# Patient Record
Sex: Male | Born: 1950 | Race: Black or African American | Hispanic: No | Marital: Single | Smoking: Former smoker
Health system: Southern US, Community
[De-identification: ages and names within clinical notes are randomized; demographics above are authoritative.]

## PROBLEM LIST (undated history)

## (undated) DIAGNOSIS — I10 Essential (primary) hypertension: Secondary | ICD-10-CM

## (undated) DIAGNOSIS — Z950 Presence of cardiac pacemaker: Secondary | ICD-10-CM

## (undated) DIAGNOSIS — K5900 Constipation, unspecified: Secondary | ICD-10-CM

## (undated) DIAGNOSIS — I519 Heart disease, unspecified: Secondary | ICD-10-CM

## (undated) DIAGNOSIS — B20 Human immunodeficiency virus [HIV] disease: Secondary | ICD-10-CM

## (undated) DIAGNOSIS — M1990 Unspecified osteoarthritis, unspecified site: Secondary | ICD-10-CM

## (undated) HISTORY — DX: Heart disease, unspecified: I51.9

---

## 1993-04-19 DIAGNOSIS — B20 Human immunodeficiency virus [HIV] disease: Secondary | ICD-10-CM

## 1993-04-19 HISTORY — DX: Human immunodeficiency virus (HIV) disease: B20

## 1997-08-10 ENCOUNTER — Emergency Department (HOSPITAL_COMMUNITY): Admission: EM | Admit: 1997-08-10 | Discharge: 1997-08-10 | Payer: Self-pay | Admitting: Emergency Medicine

## 2000-07-05 ENCOUNTER — Inpatient Hospital Stay (HOSPITAL_COMMUNITY): Admission: EM | Admit: 2000-07-05 | Discharge: 2000-07-08 | Payer: Self-pay | Admitting: Emergency Medicine

## 2000-07-05 ENCOUNTER — Encounter (INDEPENDENT_AMBULATORY_CARE_PROVIDER_SITE_OTHER): Payer: Self-pay | Admitting: Specialist

## 2000-07-06 ENCOUNTER — Encounter: Payer: Self-pay | Admitting: Urology

## 2001-04-19 DIAGNOSIS — H9190 Unspecified hearing loss, unspecified ear: Secondary | ICD-10-CM | POA: Insufficient documentation

## 2003-04-20 DIAGNOSIS — E119 Type 2 diabetes mellitus without complications: Secondary | ICD-10-CM | POA: Insufficient documentation

## 2004-07-30 DIAGNOSIS — E538 Deficiency of other specified B group vitamins: Secondary | ICD-10-CM

## 2005-01-25 DIAGNOSIS — A539 Syphilis, unspecified: Secondary | ICD-10-CM

## 2006-03-14 DIAGNOSIS — I10 Essential (primary) hypertension: Secondary | ICD-10-CM | POA: Insufficient documentation

## 2006-09-18 DIAGNOSIS — Z96649 Presence of unspecified artificial hip joint: Secondary | ICD-10-CM

## 2006-11-28 DIAGNOSIS — D510 Vitamin B12 deficiency anemia due to intrinsic factor deficiency: Secondary | ICD-10-CM

## 2007-01-31 ENCOUNTER — Emergency Department (HOSPITAL_COMMUNITY): Admission: EM | Admit: 2007-01-31 | Discharge: 2007-01-31 | Payer: Self-pay | Admitting: Emergency Medicine

## 2007-04-03 ENCOUNTER — Encounter (INDEPENDENT_AMBULATORY_CARE_PROVIDER_SITE_OTHER): Payer: Self-pay | Admitting: Nurse Practitioner

## 2007-04-03 LAB — CONVERTED CEMR LAB
Alkaline Phosphatase: 111 units/L
BUN: 15 mg/dL
CO2: 16 meq/L
Creatinine, Ser: 1.15 mg/dL
GFR calc non Af Amer: >59 mL/min
Glucose, Bld: 117 mg/dL
Sodium: 135 meq/L

## 2007-04-12 ENCOUNTER — Encounter (INDEPENDENT_AMBULATORY_CARE_PROVIDER_SITE_OTHER): Payer: Self-pay | Admitting: Nurse Practitioner

## 2007-04-12 DIAGNOSIS — R066 Hiccough: Secondary | ICD-10-CM

## 2007-04-12 LAB — CONVERTED CEMR LAB
ALT: 17 units/L
HCT: 39.9 %
MCHC: 33.6 g/dL
MCV: 109.2 fL
RBC: 3.65 M/uL
Total Bilirubin: 0.3 mg/dL
WBC: 7.4 10*3/uL

## 2007-08-22 ENCOUNTER — Ambulatory Visit: Payer: Self-pay | Admitting: Nurse Practitioner

## 2007-08-22 DIAGNOSIS — Z8669 Personal history of other diseases of the nervous system and sense organs: Secondary | ICD-10-CM | POA: Insufficient documentation

## 2007-08-22 LAB — CONVERTED CEMR LAB: Blood Glucose, Fingerstick: 135

## 2007-08-24 ENCOUNTER — Encounter (INDEPENDENT_AMBULATORY_CARE_PROVIDER_SITE_OTHER): Payer: Self-pay | Admitting: Nurse Practitioner

## 2007-09-01 ENCOUNTER — Encounter (INDEPENDENT_AMBULATORY_CARE_PROVIDER_SITE_OTHER): Payer: Self-pay | Admitting: Nurse Practitioner

## 2007-09-04 ENCOUNTER — Encounter (INDEPENDENT_AMBULATORY_CARE_PROVIDER_SITE_OTHER): Payer: Self-pay | Admitting: Nurse Practitioner

## 2007-09-05 ENCOUNTER — Encounter (INDEPENDENT_AMBULATORY_CARE_PROVIDER_SITE_OTHER): Payer: Self-pay | Admitting: Nurse Practitioner

## 2007-11-03 ENCOUNTER — Emergency Department (HOSPITAL_COMMUNITY): Admission: EM | Admit: 2007-11-03 | Discharge: 2007-11-03 | Payer: Self-pay | Admitting: Family Medicine

## 2008-01-13 ENCOUNTER — Emergency Department (HOSPITAL_COMMUNITY): Admission: EM | Admit: 2008-01-13 | Discharge: 2008-01-13 | Payer: Self-pay | Admitting: *Deleted

## 2008-04-19 HISTORY — PX: OTHER SURGICAL HISTORY: SHX169

## 2008-04-19 HISTORY — PX: JOINT REPLACEMENT: SHX530

## 2008-08-19 ENCOUNTER — Inpatient Hospital Stay (HOSPITAL_COMMUNITY): Admission: RE | Admit: 2008-08-19 | Discharge: 2008-08-23 | Payer: Self-pay | Admitting: Orthopedic Surgery

## 2008-11-04 ENCOUNTER — Encounter: Payer: Self-pay | Admitting: Internal Medicine

## 2008-11-04 ENCOUNTER — Ambulatory Visit: Payer: Self-pay | Admitting: Internal Medicine

## 2008-11-04 LAB — CONVERTED CEMR LAB: Blood Glucose, Fingerstick: 94

## 2008-11-08 ENCOUNTER — Encounter: Payer: Self-pay | Admitting: Internal Medicine

## 2009-01-01 ENCOUNTER — Ambulatory Visit: Payer: Self-pay | Admitting: Internal Medicine

## 2009-05-13 ENCOUNTER — Emergency Department (HOSPITAL_COMMUNITY): Admission: EM | Admit: 2009-05-13 | Discharge: 2009-05-13 | Payer: Self-pay | Admitting: Emergency Medicine

## 2009-05-31 IMAGING — CR DG PORTABLE PELVIS
1 series · 1 of 1 positions shown · non-contrast
Comparison: None

CLINICAL DATA: Avascular necrosis of the left hip - left total hip
replacement.

PORTABLE PELVIS

[view not recorded]
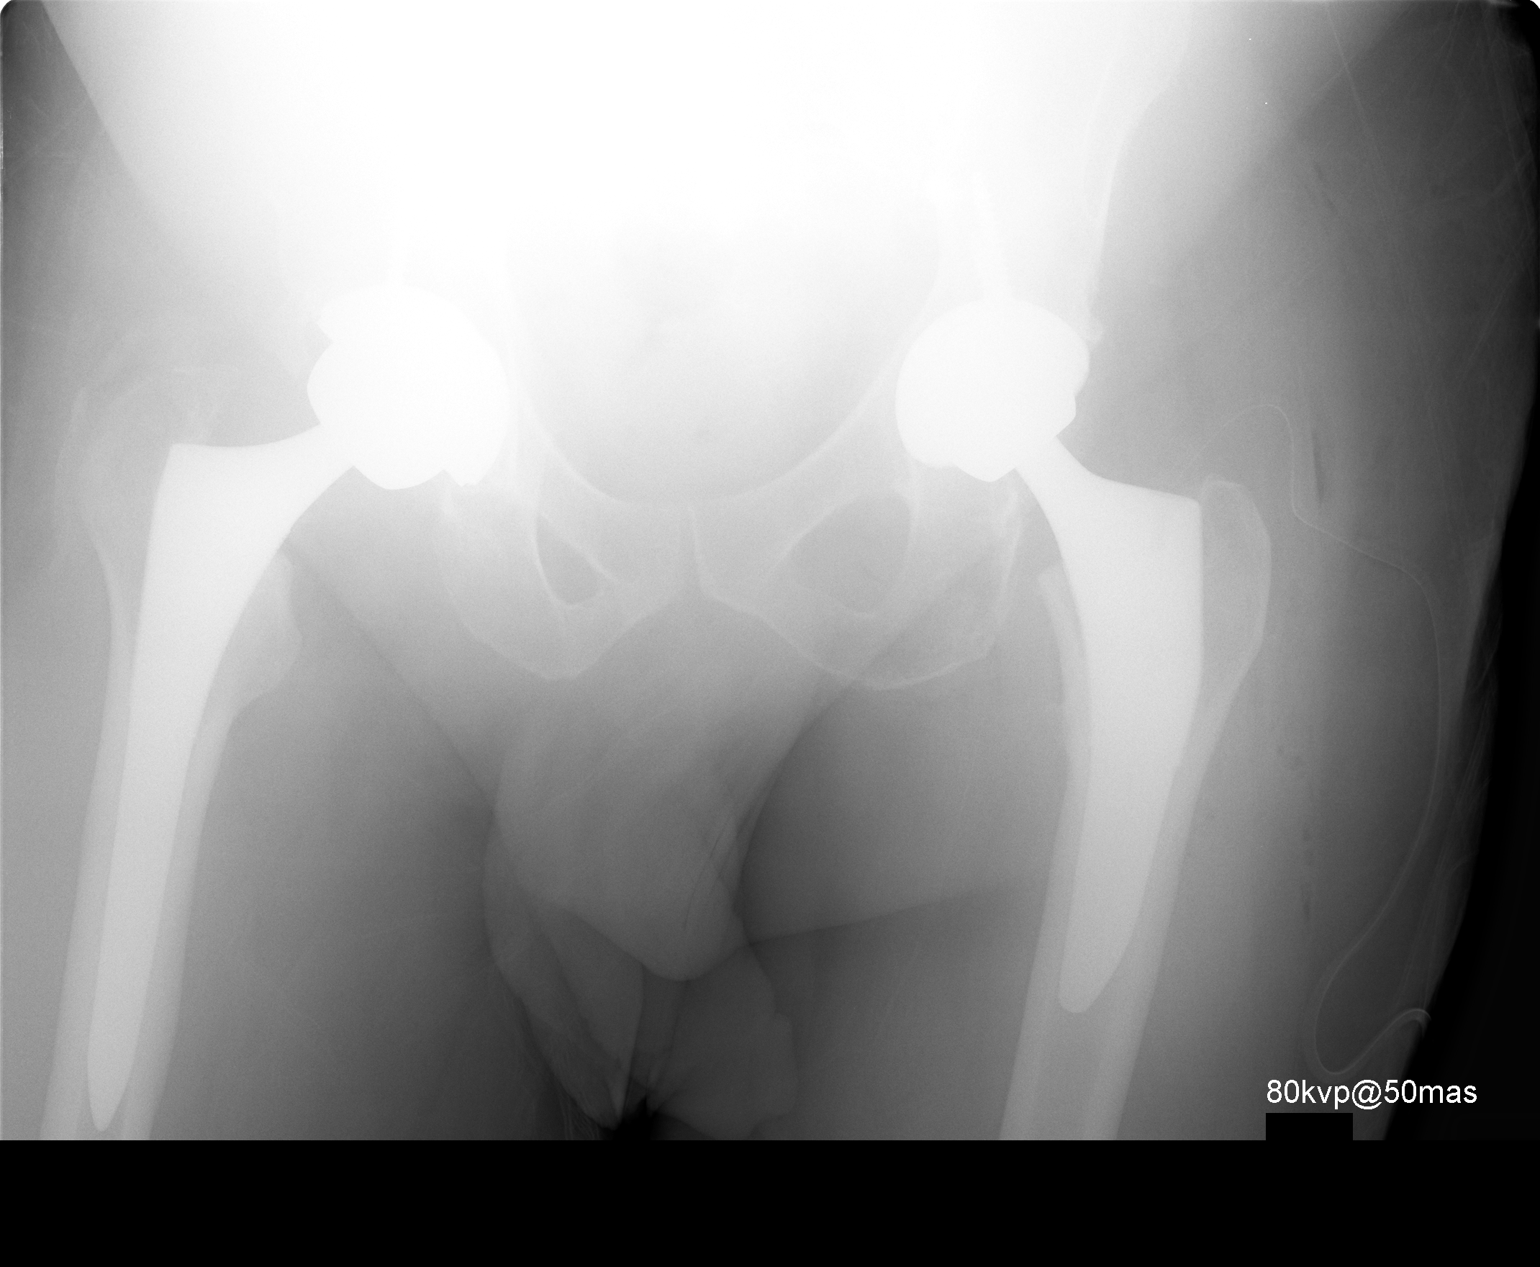

[1 of 1 positions shown; findings below may reference images not displayed]

FINDINGS: Bilateral total hip replacements are identified.
Recent postoperative changes and overlying drain on the left are
noted.
There is no evidence of subluxation or dislocation on this single
view.
No complicating features are identified.
IMPRESSION: Left total hip replacement without complicating features.

## 2010-07-28 LAB — CBC
HCT: 34.3 % — ABNORMAL LOW (ref 39.0–52.0)
Hemoglobin: 11.4 g/dL — ABNORMAL LOW (ref 13.0–17.0)
MCHC: 34.6 g/dL (ref 30.0–36.0)
MCV: 109 fL — ABNORMAL HIGH (ref 78.0–100.0)
Platelets: 174 10*3/uL (ref 150–400)
Platelets: 183 10*3/uL (ref 150–400)
RBC: 3.05 MIL/uL — ABNORMAL LOW (ref 4.22–5.81)
RDW: 13.2 % (ref 11.5–15.5)
WBC: 4.8 10*3/uL (ref 4.0–10.5)

## 2010-07-28 LAB — GLUCOSE, CAPILLARY
Glucose-Capillary: 101 mg/dL — ABNORMAL HIGH (ref 70–99)
Glucose-Capillary: 101 mg/dL — ABNORMAL HIGH (ref 70–99)
Glucose-Capillary: 102 mg/dL — ABNORMAL HIGH (ref 70–99)
Glucose-Capillary: 103 mg/dL — ABNORMAL HIGH (ref 70–99)
Glucose-Capillary: 105 mg/dL — ABNORMAL HIGH (ref 70–99)
Glucose-Capillary: 112 mg/dL — ABNORMAL HIGH (ref 70–99)
Glucose-Capillary: 113 mg/dL — ABNORMAL HIGH (ref 70–99)
Glucose-Capillary: 123 mg/dL — ABNORMAL HIGH (ref 70–99)
Glucose-Capillary: 130 mg/dL — ABNORMAL HIGH (ref 70–99)
Glucose-Capillary: 135 mg/dL — ABNORMAL HIGH (ref 70–99)
Glucose-Capillary: 83 mg/dL (ref 70–99)

## 2010-07-28 LAB — BASIC METABOLIC PANEL
BUN: 6 mg/dL (ref 6–23)
CO2: 25 mEq/L (ref 19–32)
Calcium: 8.2 mg/dL — ABNORMAL LOW (ref 8.4–10.5)
Calcium: 8.5 mg/dL (ref 8.4–10.5)
Creatinine, Ser: 0.84 mg/dL (ref 0.4–1.5)
GFR calc Af Amer: >60 mL/min (ref >60)
GFR calc non Af Amer: >60 mL/min (ref >60)
Potassium: 4.3 mEq/L (ref 3.5–5.1)
Sodium: 136 mEq/L (ref 135–145)

## 2010-07-28 LAB — TYPE AND SCREEN
ABO/RH(D): B POS
Antibody Screen: NEGATIVE

## 2010-07-29 LAB — URINALYSIS, ROUTINE W REFLEX MICROSCOPIC
Glucose, UA: NEGATIVE mg/dL
Protein, ur: NEGATIVE mg/dL

## 2010-07-29 LAB — DIFFERENTIAL
Basophils Absolute: 0.1 10*3/uL (ref 0.0–0.1)
Basophils Relative: 1 % (ref 0–1)
Eosinophils Relative: 4 % (ref 0–5)
Monocytes Absolute: 0.3 10*3/uL (ref 0.1–1.0)
Monocytes Relative: 7 % (ref 3–12)
Neutro Abs: 2.3 10*3/uL (ref 1.7–7.7)

## 2010-07-29 LAB — BASIC METABOLIC PANEL
CO2: 29 mEq/L (ref 19–32)
Calcium: 10.1 mg/dL (ref 8.4–10.5)
Chloride: 101 mEq/L (ref 96–112)
GFR calc Af Amer: >60 mL/min (ref >60)
Glucose, Bld: 137 mg/dL — ABNORMAL HIGH (ref 70–99)
Potassium: 4.7 mEq/L (ref 3.5–5.1)
Sodium: 140 mEq/L (ref 135–145)

## 2010-07-29 LAB — CBC
HCT: 44.8 % (ref 39.0–52.0)
Hemoglobin: 15.8 g/dL (ref 13.0–17.0)
MCHC: 35.2 g/dL (ref 30.0–36.0)
RBC: 4.11 MIL/uL — ABNORMAL LOW (ref 4.22–5.81)
RDW: 13.4 % (ref 11.5–15.5)

## 2010-07-29 LAB — APTT: aPTT: 25 seconds (ref 24–37)

## 2010-09-01 NOTE — Discharge Summary (Signed)
NAME:  Adam Ponce, Adam Ponce NO.:  [id number]   MEDICAL RECORD NO.:  [id number]          PATIENT TYPE:  INP   LOCATION:  1613                         FACILITY:  [facility]   PHYSICIAN:  Madlyn Frankel. Charlann Boxer, M.D.  DATE OF BIRTH:  1950/10/15   DATE OF ADMISSION:  08/19/2008  DATE OF DISCHARGE:  08/21/2008                               DISCHARGE SUMMARY   ADMITTING DIAGNOSES:  1. Avascular necrosis.  2. Osteoarthritis.  3. HIV positive.  4. Hypertension.  5. Diabetes.   DISCHARGE DIAGNOSES:  1. Avascular necrosis.  2. Osteoarthritis.  3. HIV positive.  4. Hypertension.  5. Diabetes.   HISTORY OF PRESENT ILLNESS:  A 60 year old male with a history of left  hip pain secondary to avascular necrosis.  He is HIV positive on  medications.  Left hip was refractory to all conservative treatment, was  admitted to the hospital for definitive surgical management of a left  total hip replacement.   PROCEDURE:  Left total hip replacement by surgeon Dr. Durene Romans.  Assistant Dwyane Luo PA-C.   CONSULTATION:  None.   LABORATORY DATA:  CBC final reading:  White cell blood cell 5.8,  hemoglobin 11.5, hematocrit 33.2, platelets 183.  Metabolic panel sodium  133, potassium 4, BUN 6, creatinine 0.84, glucose 111.   RADIOLOGY:  Portable pelvis postoperatively showed satisfactory  appearance of left total hip replacement with no complicating features.   Chest x-ray done on April 29 showed low lung volumes with mild left base  scar atelectasis.  No acute cardiopulmonary disease.   HOSPITAL COURSE:  Adam Ponce underwent left total hip replacement  admitted to orthopedic floor.  His stay was unremarkable.  He remained  hemodynamically orthopedically stable.  He had significant improvement  and is able to ambulate without pain.  He was weightbearing as tolerated  with no complications with ambulation.  He remained neurovascularly  intact of his left lower extremity.  His dressing was  changed on day #2  with no significant drainage from his wound or the portal site.  Based  upon his progress.  He was stable and improved with need for further  rehab due to independent status, but was stable and ready.   DISCHARGE DISPOSITION:  Discharge to skilled nursing facility rehab,  stable improved condition.   DISCHARGE INSTRUCTIONS:  1. Discharge Physical Therapy.  He is weightbearing as tolerated of      his left lower extremity with the use a rolling walker with      transition to single point cane as progress improves.  2. Discharge Diet.  Regular.  3. Discharge wound care.  Keep wound dry change dressing on a daily      basis.   DISCHARGE MEDICATIONS:  1. Lovenox 40 mg subcu q. 24 times 10 days.  2. Robaxin 500 mg one p.o. q.6 p.r.n. muscle spasm and pain.  3. Norco 7.5/325 one to two p.o. q.4-6 p.r.n. pain.  4. Iron 325 mg one p.o. t.i.d. x2 weeks.  5. Colace 100 mg one p.o. b.i.d. p.r.n. constipation.  6. MiraLax 70 grams one p.o.  daily p.r.n. constipation.  7. Start enteric-coated aspirin 325 mg one p.o. daily x4 weeks after      Lovenox completed.  8. Lisinopril 40 mg one p.o. q.a.m.  9. Potassium 20 mEq p.o. b.i.d. with food.  10.Metformin 1000 mg one p.o. b.i.d.  11.Reyataz 200 mg 2 tablets q.a.m.  12.Norvir 100 mg p.o. q.a.m., has to be refrigerated and taken with      food.  13.Truvada which is emtricitabine 200 mg with tenofovir 300 mg one      p.o. q.a.m.  14.Vitamin B12 1000 mcg injection q. monthly intramuscular.  15.Doxycycline 100 mg one p.o. q.a.m.  16.Morphine slow release 30 mg one p.o. q. 12 p.r.n.  17.Lactulose 10 grams per 15 mL one tablespoon every day as needed.  18.Konsyl which psyllium 1-2 tablespoons every day p.r.n.  19.Proventil inhaler two puffs p.r.n.  20.Omeprazole 20 mg one p.o. daily p.r.n.  21.Ativan 0.5 to 1 mg p.o. or IV q. 6-8 p.r.n. anxiety.   DISCHARGE FOLLOWUP:  Follow up with Dr. Charlann Boxer at phone number [phone] in  two  weeks for wound check.     ______________________________  Yetta Glassman. [address], [place]      Madlyn Frankel. Charlann Boxer, M.D.  Electronically Signed    BLM/MEDQ  D:  08/21/2008  T:  08/21/2008  Job:  [id number]   cc:   [facility], [facility] Dr. Haywood Pao

## 2010-09-01 NOTE — H&P (Signed)
NAME:  Adam Ponce, Adam Ponce NO.:  [id number]   MEDICAL RECORD NO.:  [id number]          PATIENT TYPE:  INP   LOCATION:  NA                           FACILITY:  [facility]   PHYSICIAN:  Madlyn Frankel. Charlann Boxer, M.D.  DATE OF BIRTH:  Oct 25, 1950   DATE OF ADMISSION:  08/19/2008  DATE OF DISCHARGE:                              HISTORY & PHYSICAL   PROCEDURE:  Left total hip replacement.   CHIEF COMPLAINT:  Left hip pain.   HISTORY OF PRESENT ILLNESS:  A 60 year old male with a history of left  hip pain secondary to avascular necrosis.  He is HIV positive.  His left  hip has been refractory to all conservative treatment.  He has had a  history of right total hip replacement in the past at the [facility].  He did develop some infection requiring a washout.  Since then, he has  been asymptomatic.  He does report that he has chronic drainage from his  right ear from prior tube placement and I believe this may have been the  source of infection.  He is on chronic antibiotics for this.   PAST MEDICAL HISTORY:  1. Avascular necrosis.  2. Osteoarthritis.  3. HIV positive.  4. Hypertension.  5. Diabetes.   PAST SURGICAL HISTORY:  Right total hip replacement at the [facility]  in [place].   FAMILY HISTORY:  Noncontributory.   SOCIAL HISTORY:  He is a smoker.  He will require rehab facility stay  postoperatively.   DRUG ALLERGIES:  NO KNOWN DRUG ALLERGIES.   PRIMARY CARE PHYSICIAN:  Dr. Haywood Pao at the [facility] in  [place].   CURRENT MEDICATIONS:  Include:  1. Reyataz which he will bring to the hospital for self-      administration.  2. Lisinopril.  3. Doxycycline.  4. Potassium.  5. Naproxen p.r.n.  6. Travatan.  7. Metformin.  8. Hydrocodone.  9. Morphine.   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  Will require occlusion at  time of surgery to prevent drainage.  GASTROINTESTINAL:  He  has  heartburn.  MUSCULOSKELETAL:  He has joint pain, muscular pain, spasms,  morning stiffness, as well as weakness.  Otherwise, see HPI.   PHYSICAL EXAMINATION:  Pulse 72.  Respirations 16.  Blood pressure  126/82.  GENERAL:  Awake, alert, and oriented.  HEENT:  Normocephalic.  No drainage from his right ear.  NECK:  Supple.  No carotid bruits.  CHEST AND LUNGS:  Clear to auscultation bilaterally.  BREASTS:  Deferred.  HEART:  S1 and S2 distinct.  ABDOMEN:  Bowel sounds present.  PELVIS:  Stable.  GENITOURINARY:  Deferred.  EXTREMITIES:  Left hip has increased pain with range of motion and  weightbearing.  SKIN:  No cellulitis of the left hip.  NEUROLOGIC:  He has intact distal sensibilities of his left lower  extremity.   LABORATORY DATA:  Labs, EKG, chest x-ray pending presurgical testing.   IMPRESSION:  1. Left hip avascular  necrosis.  2. HIV positive.   PLAN OF ACTION:  Left total hip replacement by Dr. Charlann Boxer at [facility]  on Aug 19, 2008, at 9:35 a.m.  Risks and complications were discussed.  Questions were encouraged, answered, and reviewed.  Will require rehab  stay postoperatively.     ______________________________  Yetta Glassman [address], [place]      Madlyn Frankel. Charlann Boxer, M.D.  Electronically Signed    BLM/MEDQ  D:  08/16/2008  T:  08/16/2008  Job:  [id number]   cc:   Haywood Pao, M.D.  [facility]

## 2010-09-01 NOTE — Op Note (Signed)
NAME:  Adam Ponce, Adam Ponce NO.:  [id number]   MEDICAL RECORD NO.:  [id number]          PATIENT TYPE:  INP   LOCATION:  0005                         FACILITY:  [facility] [place] [facility]   PHYSICIAN:  Madlyn Frankel. Charlann Boxer, M.D.  DATE OF BIRTH:  Aug 21, 1950   DATE OF PROCEDURE:  08/19/2008  DATE OF DISCHARGE:                               OPERATIVE REPORT   PREOPERATIVE DIAGNOSIS:  Left hip avascular necrosis.   POSTOPERATIVE DIAGNOSIS:  Left hip avascular necrosis.   PROCEDURE:  Left total hip replacement.   COMPONENTS USED:  Dupuy hip system, size 52 pinnacle cup, 36 metal liner  and an 8 high Trilot  stem with 36/1.5 ball.   SURGEON:  Madlyn Frankel. Charlann Boxer, M.D.   ASSISTANT:  Yetta Glassman. [address]oreta [address], [place]   ANESTHESIA:  General   BLOOD LOSS:  450.   DRAINS:  One.   SPECIMENS:  None.   COMPLICATIONS:  None.   INDICATIONS FOR PROCEDURE:  Mr. Fray is a 60 year old veteran with a  history of HIV.  He had had a history of right total hip replacement in  the [facility] but due to delay of care, he had sought care elsewhere.  He was seen and evaluated in the office and noted to have severe  collapse of his femoral head with probably 60 to 70% collapse with  persistent groin pain.  Workup in the office was negative for any  concern for infection despite a chronically draining ear.  He was on  doxycycline chronic.  Discussed with him the increased risks of  infection in his current situation despite the success of the right hip,  the risks of DVT, component failure, dislocation, need for revision  surgery.  There was noted to be significant shortness on his left side  from a combination of his AVN as well as his right hip surgery.  Consent  was obtained for the benefit of pain relief.   PROCEDURE IN DETAIL:  The patient was brought to the operative theater.  Once adequate anesthesia and appropriate antibiotics, Ancef  administered, the patient was administered, the patient was positioned  in the  right lateral decubitus position with his left side up.  Prior to  him being positioned, he was examined in a supine position evaluating  his leg length discrepancy on his left side and then evaluating this in  the lateral decubitus position.  The left lower extremity was then  prescrubbed, prepped and draped in a sterile fashion.  Time out was  performed identifying the patient, following procedure and extremity.  Lateral-based incision was made for posterior approach to the hip.  The  iliotibial band and gluteal fascia were then incised posteriorly.  The  short external rotators were taken down and dissected from the  posterior capsule.  An L capsulotomy was made preserving the posterior  leaflet for later anatomic repair.  The patient was noted to have very  large effusion.  It was normal synovial fluid.  There is no signs of  purulence.  There is significant fragmentation and synovitis within the  hip joint.  The  hip was dislocated and neck osteotomy was made into the  trochanteric fossa.  Severe avascular necrosis noted with severe  collapse.   Attention was first directed to the femur from the canal which was  opened with a canal drill by a box osteotome.  Then hand reamed once and  then irrigated to prevent fat emboli.  I began broaching with a size 1  broach and then broached up to a size 7 broach initially.  I used a  calcar planer to loosen the anterior bone.   Once this was done, attention was directed to the acetabulum.  Acetabular exposure was obtained.  I reamed with a 46 reamer first and  then up to 51 reamer with excellent bony bed preparation.   I set the cup orientation at about 20 degrees of forward flexion and  beneath the anterior wall anteriorly with a portion of the cuff exposed  superolaterally.   The cup with the hip guided appeared to be at about 35 to 40 degrees of  abduction and  about 20 degrees of forward flexion.  Two cancellous  screws were placed.   Trial reduction was then carried out with a 7 high  trial  broach and neck and a 36/1.5 ball with a 36 trial liner.  The hip  stability was very good  without evidence of significant impingement or  subluxation.  After the hip was flipped out, he was a bit short on this  side.   At this point, we removed all trial components.  I placed the 5/36 metal  liner.  I impacted it with good circumferential rim fit.  I then  broached up to a size 8 which sat a little bit more proud than the size  7 stem.  A final size 8 high Trilot stem was then opened and then  impacted.  There was no complication in the impaction.  I did then  retrial 36/1.5 ball and found that the leg lengths were a little bit  more comparable, perhaps still a bit shorter on his left side but  nonetheless,  it appeared hip range of motion was better without  evidence of impingement or subluxation throughout her range of motion.  Given these parameters, the final 36/1.5 ball was opened and impacted  onto a clean and dried trunion.  Hip was reduced.  The hip was irrigated  throughout the case and again at this point.  A medium Hemovac drain was  placed deep.  I reapproximated the posterior capsule to the superior  leaflet using #1 Vicryl.  The iliotibial band and gluteal fascia were  then reapproximated with #1 Vicryl over the top of the drain.  A 2-0  Vicryl was used in the subcu layer and running 4-0 Monocryl in the skin.  The skin was cleaned, dried, and dressed sterilely with  Steri-Strips  and a Mepilex dressing.  He was then brought to the recovery room in  stable condition.  He tolerated the procedure well.      Madlyn Frankel Charlann Boxer, M.D.  Electronically Signed     MDO/MEDQ  D:  08/19/2008  T:  08/19/2008  Job:  [id number]

## 2010-09-04 NOTE — Discharge Summary (Signed)
NAME:  Adam Ponce, Adam Ponce NO.:  [id number]   MEDICAL RECORD NO.:  [id number]          PATIENT TYPE:  INP   LOCATION:  1613                         FACILITY:  [facility]   PHYSICIAN:  Madlyn Frankel. Charlann Boxer, M.D.  DATE OF BIRTH:  [birth year]   DATE OF ADMISSION:  08/19/2008  DATE OF DISCHARGE:  08/23/2008                               DISCHARGE SUMMARY   ADDENDUM:   DISPOSITION:  Discharged home with home health care, stable and improved  condition.   HOSPITAL COURSE:  Patient remained in hospital on the 6th and 7th.  Patient was a [place] patient.  He was to go to [facility] since he  lives by himself.  Unfortunately, there were no [facility] beds  available.  [facility].  As a result, we tried to  coordinate nurse to help with bandage changes as well as to provide some  more physical therapy in the hospital to allow for more independence in  his ability to ambulate and get from sitting to standing.  Once again,  on the 7th he was stable and improved, had arranged for home health care  nurse to come out for wound management, this was to prevent infection  and he was stable and ready for discharge.     ______________________________  Yetta Glassman. [address], [place]      Madlyn Frankel. Charlann Boxer, M.D.  Electronically Signed    BLM/MEDQ  D:  08/29/2008  T:  08/29/2008  Job:  [id number]

## 2010-09-04 NOTE — Op Note (Signed)
[facility]  Patient:    Adam Ponce, Adam Ponce                        MRN: [id number] Proc. Date: 07/07/00 Adm. Date:  [id number] Disc. Date: [id number] Attending:  Nelma Rothman Iii                           Operative Report  PREOPERATIVE DIAGNOSES:  Right flank pain, hydronephrosis due to right distal ureteral stone composed of Indinavir.  POSTOPERATIVE DIAGNOSES:  Right flank pain, hydronephrosis due to right distal ureteral stone composed of Indinavir.  PROCEDURE:  Cystoscopy, right retrograde pyelogram with interpretation, right ureteroscopy, and stone basketing with removal of this stone and insertion of right double J catheter.  SURGEON:  Dr. Larey Dresser.  ANESTHESIA:  General.  INDICATIONS FOR PROCEDURE:  This 60 year old AIDS patient has been hospitalized with severe right flank pain with hydronephrosis seen on CT and IVP down to the distal right ureter. No calcified stone was seen on CT because he was felt to have an Indinavir stone which is non-radiopaque. Because of persistent symptoms and need for intravenous narcotics, he is brought to the OR today for removal of this stone.  DESCRIPTION OF PROCEDURE:  The patient was brought to the operating room and placed in lithotomy position. He was cystoscoped using the camera attachment. The anterior urethra was normal. The bladder was unremarkable with no stones, tumors, or inflammatory lesions. I passed a floppy metal guidewire up the distal right ureter without difficulty and then injected contrast through open ended ureteral catheter which had been placed over the guidewire and there was delicate upper ureter with some mild hydronephrosis. I then reinserted a guidewire which I used as a safety wire and removed the cystoscope. I then inserted the 6 French thin rigid ureteroscope and as soon as I got in the intramural ureter, I encountered golden, friable, "stony" like material. I used the  Elsie stone basket to retrieve several small pieces of stone in light of the other stone flushed out as I would draw back in the basket. The stone was mainly in the intramural ureter. After removing a good deal of obstructing debris, I got in the intramural ureter and there were no stones proximally. I then swept down the ureter with the stone basket and essentially all the stone was removed except for some tiny microdebris attached to the side walls. I think this will pass spontaneously. There were certainly no residual stone fragments of any size left whatsoever. I then reinjected through the ureteroscope and the upper ureter was delicate and intact and I visualized the pyelocaliceal system. I then irrigated the bladder out and sent this soft stony material for analysis. Over the guidewire, I inserted a 7 French 26 cm double J catheter whose proximal end was in the upper pyelocaliceal system. There was a full coil in the bladder and the string was brought out per urethra and later taped to the penis. The patient was then taken to the recovery room in good condition having tolerated the procedure well. DD:  07/07/00 TD:  07/08/00 Job: [id number]

## 2010-09-04 NOTE — Discharge Summary (Signed)
[facility]  Patient:    Adam Ponce, Adam Ponce                        MRN: [id number] Adm. Date:  [id number] Disc. Date: [id number] Attending:  Nelma Rothman Iii                           Discharge Summary  DIAGNOSES: 1. 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 2. Kidney stones.  OPERATIVE PROCEDURE:  Cystourethroscopy with removal of kidney stones and placement of double-J stent and right retrograde ureteral pyelogram by Dr. Larey Dresser on July 07, 2000.  HISTORY AND PHYSICAL:  Mr. Maffei is a very nice 60 year old white male with HIV positive, who presented with right flank pain, nausea, and vomiting for 18 hours.  CT scan revealed a right hydronephrosis with columning down to the lower ureter.  He has been evaluated by general surgery and felt not to have appendicitis, but it was felt that it was most likely an indenavir kidney stone, and they do not show up on CT scan but result in obstruction, and he is currently on that medication.  Past medical history, social history, family history, and review of systems, please see history and physical for full details.  PHYSICAL EXAMINATION:  VITAL SIGNS:  Afebrile, vital signs stable.  GENERAL:  Well-nourished, well-developed, mild distress, oriented x 3.  HEENT:  PERRLA.  NECK:  Without masses or thyromegaly.  CHEST:  Clear anterior and posterior without rales or rhonchi.  HEART:  Normal sinus rhythm, no murmurs or gallops.  ABDOMEN:  Right CVA tenderness noted on palpation.  GENITOURINARY:  Penis and testicles without lesion.  EXTREMITIES:  Normal.  SKIN:  Normal.  NEUROLOGIC: Intact.  HOSPITAL COURSE:  The patient was admitted.  Again, had been evaluated by general surgery.  He felt he did not have a general surgical problem.  He was noted to have continued pain and underwent an IVP which revealed complete obstruction to the UVJ, and we felt that probably  intervention was indicated. The pain continued.  He was taken to surgery by Dr. Vonita Moss on October 07, 2000, and underwent cystourethroscopy, right retrograde pyelogram with removal of the stone fragments and placement of double-J stent.  He rapidly clinically improved by July 07, 2000, was comfortable.  We discussed the situation with the stones, hydration, etc.  He was subsequently discharged on July 08, 2000, voiding well.  DISCHARGE MEDICATIONS:  Vicodin.  DISCHARGE CONDITION:  Improved.  He is to see me in my office the following week for stent removal and, again, instructions were discussed.  His stone analysis is pending.  Of note, his BMET was essentially normal with a creatinine of 1.6. DD:  07/15/00 TD:  07/16/00 Job: [id number]

## 2010-09-04 NOTE — Group Therapy Note (Signed)
NAME:  Adam Ponce, Adam Ponce NO.:  [id number]   MEDICAL RECORD NO.:  [id number]          PATIENT TYPE:  EMS   LOCATION:  URG                          FACILITY:  [facility]   PHYSICIAN:  Angus G. Renard Matter, MD   DATE OF BIRTH:  03/28/1951   DATE OF PROCEDURE:  DATE OF DISCHARGE:  01/31/2007                                 PROGRESS NOTE   This patient was admitted with pneumonia, generalized weakness.  He  remains afebrile is slowly improving.  He did have an episode of  hypoglycemia prior to admission but is blood sugars have run been  running more normal.   OBJECTIVE:  VITAL SIGNS:  Blood pressure 149/82, respiration 18, pulse  97, temperature 97.4. The patient's hemoglobin 9.3, hematocrit 27.3, INR  2.0.  LUNGS:  Rhonchi over lower lung field.  HEART:  Regular rhythm.  ABDOMEN:  No palpable organs or masses.   ASSESSMENT:  The patient continues to improve with reference to his  pneumonia.   PLAN:  To proceed with discharge following repeat chest x-ray.      Angus G. Renard Matter, MD  Electronically Signed     AGM/MEDQ  D:  02/01/2007  T:  02/01/2007  Job:  [id number]

## 2011-01-18 LAB — BASIC METABOLIC PANEL
BUN: 5 — ABNORMAL LOW
Chloride: 100
Creatinine, Ser: 0.75

## 2011-03-01 ENCOUNTER — Emergency Department (HOSPITAL_COMMUNITY)
Admission: EM | Admit: 2011-03-01 | Discharge: 2011-03-01 | Disposition: A | Discharge status: Home / Self Care | Payer: Medicare Other | Attending: Emergency Medicine | Admitting: Emergency Medicine

## 2011-03-01 DIAGNOSIS — M79609 Pain in unspecified limb: Secondary | ICD-10-CM | POA: Insufficient documentation

## 2011-03-01 DIAGNOSIS — L02619 Cutaneous abscess of unspecified foot: Secondary | ICD-10-CM | POA: Insufficient documentation

## 2011-03-01 DIAGNOSIS — Z79899 Other long term (current) drug therapy: Secondary | ICD-10-CM | POA: Insufficient documentation

## 2011-03-01 DIAGNOSIS — M25476 Effusion, unspecified foot: Secondary | ICD-10-CM | POA: Insufficient documentation

## 2011-03-01 DIAGNOSIS — J45909 Unspecified asthma, uncomplicated: Secondary | ICD-10-CM | POA: Insufficient documentation

## 2011-03-01 DIAGNOSIS — E1169 Type 2 diabetes mellitus with other specified complication: Secondary | ICD-10-CM | POA: Insufficient documentation

## 2011-03-01 DIAGNOSIS — I10 Essential (primary) hypertension: Secondary | ICD-10-CM | POA: Insufficient documentation

## 2011-03-01 DIAGNOSIS — Z7982 Long term (current) use of aspirin: Secondary | ICD-10-CM | POA: Insufficient documentation

## 2011-03-01 DIAGNOSIS — M25473 Effusion, unspecified ankle: Secondary | ICD-10-CM | POA: Insufficient documentation

## 2011-03-01 DIAGNOSIS — M129 Arthropathy, unspecified: Secondary | ICD-10-CM | POA: Insufficient documentation

## 2011-03-01 DIAGNOSIS — M7989 Other specified soft tissue disorders: Secondary | ICD-10-CM | POA: Insufficient documentation

## 2011-03-01 DIAGNOSIS — E11628 Type 2 diabetes mellitus with other skin complications: Secondary | ICD-10-CM

## 2011-03-01 DIAGNOSIS — Z21 Asymptomatic human immunodeficiency virus [HIV] infection status: Secondary | ICD-10-CM | POA: Insufficient documentation

## 2011-03-01 HISTORY — DX: Unspecified osteoarthritis, unspecified site: M19.90

## 2011-03-01 HISTORY — DX: Human immunodeficiency virus (HIV) disease: B20

## 2011-03-01 HISTORY — DX: Essential (primary) hypertension: I10

## 2011-03-01 MED ORDER — DOXYCYCLINE HYCLATE 100 MG PO TABS
100.0000 mg | ORAL_TABLET | Freq: Once | ORAL | Status: AC
  Administered 2011-03-01: 100 mg via ORAL
  Filled 2011-03-01: qty 1

## 2011-03-01 MED ORDER — HYDROCODONE-ACETAMINOPHEN 5-325 MG PO TABS: 1.0000 | ORAL_TABLET | ORAL | Status: AC | PRN

## 2011-03-01 MED ORDER — DOXYCYCLINE HYCLATE 100 MG PO CAPS: 100.0000 mg | ORAL_CAPSULE | Freq: Two times a day (BID) | ORAL | Status: AC

## 2011-03-01 NOTE — ED Notes (Signed)
MD at bedside. 

## 2011-03-01 NOTE — ED Notes (Signed)
Pt states that he started a new job x 3 days ago.  Pt has had onset of left foot/ankle and lower leg pain for 2 days.  Pt started wearing new shoes.  Pt states that he has had increase in swelling.  Pt denies any injury at this time.

## 2011-03-01 NOTE — ED Provider Notes (Signed)
History     CSN: [id number] Arrival date & time: 03/01/2011  8:10 AM   First MD Initiated Contact with Patient 03/01/11 0827      Chief Complaint  Patient presents with  . Foot Pain    Pt has had left foot pain for 2 days.    (Consider location/radiation/quality/duration/timing/severity/associated sxs/prior treatment) HPI Comments: Patient presents with left foot and ankle swelling.  He notes that he started new job over the last few days.  He has had to wear new shoes.  He notes he's had similar symptoms in the past has been told he had an infection there and has been placed on antibiotics.  He has difficulty getting his foot and issues due to the amount of swelling.  He otherwise has no fevers, chills, nausea or vomiting.  He otherwise feels well.  There's mild redness to the top of the left foot.  Patient is a 60 y.o. male presenting with lower extremity pain. The history is provided by the patient.  Foot Pain This is a new problem. The current episode started 2 days ago. The problem occurs constantly. The problem has been gradually worsening. Pertinent negatives include no chest pain, no abdominal pain, no headaches and no shortness of breath. The symptoms are relieved by nothing. He has tried nothing for the symptoms.    Past Medical History  Diagnosis Date  . Arthritis   . Diabetes mellitus   . Hypertension   . Asthma   . HIV disease 1995    Past Surgical History  Procedure Date  . 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 Joint replacement 2010    Bilateral hip    History reviewed. No pertinent family history.  History  Substance Use Topics  . Smoking status: Current Everyday Smoker -- 1.0 packs/day    Types: Cigarettes  . Smokeless tobacco: Never Used  . Alcohol Use: Yes     2 pints a week      Review of Systems  Constitutional: Negative.  Negative for fever and chills.  HENT: Negative.   Eyes: Negative.  Negative for discharge and redness.  Respiratory:  Negative.  Negative for cough and shortness of breath.   Cardiovascular: Negative.  Negative for chest pain.  Gastrointestinal: Negative.  Negative for nausea, vomiting and abdominal pain.  Genitourinary: Negative.  Negative for hematuria.  Musculoskeletal: Negative for back pain.  Skin: Positive for color change. Negative for rash.  Neurological: Negative for syncope and headaches.  Hematological: Negative.  Negative for adenopathy.  Psychiatric/Behavioral: Negative.  Negative for confusion.  All other systems reviewed and are negative.    Allergies  Ciprofloxacin and Ciprofloxacin-dexamethasone  Home Medications   Current Outpatient Rx  Name Route Sig Dispense Refill  . ASPIRIN 81 MG PO TABS Oral Take 81 mg by mouth daily.      Marland Kitchen CALCIUM 600 PO Oral Take 1 tablet by mouth daily.      Marland Kitchen DARUNAVIR ETHANOLATE 400 MG PO TABS Oral Take 400 mg by mouth.      Marland Kitchen EMTRICITABINE 200 MG PO CAPS Oral Take 200 mg by mouth daily.      Marland Kitchen METFORMIN HCL 1000 MG PO TABS Oral Take 1,000 mg by mouth 2 (two) times daily with a meal.      . POTASSIUM CHLORIDE 20 MEQ PO PACK Oral Take 20 mEq by mouth 2 (two) times daily.      Marland Kitchen RITONAVIR 100 MG PO CAPS Oral Take by mouth 2 (  two) times daily.        BP 146/70  Pulse 93  Temp(Src) 98.5 F (36.9 C) (Oral)  Resp 18  Ht 5\' 11"  (1.803 m)  Wt 215 lb (97.523 kg)  BMI 29.99 kg/m2  SpO2 99%  Physical Exam  Constitutional: He is oriented to person, place, and time. He appears well-developed and well-nourished.  HENT:  Head: Normocephalic and atraumatic.  Eyes: Conjunctivae and EOM are normal. Pupils are equal, round, and reactive to light.  Neck: Normal range of motion. Neck supple.  Pulmonary/Chest: Effort normal.  Musculoskeletal: Normal range of motion.       There is mild swelling to the left foot and ankle.  It is nonpitting.  There is a palpable DP pulse on the left.  No wounds are noted on the bottom of the foot or top of the foot at this time.    Neurological: He is alert and oriented to person, place, and time.  Skin: Skin is warm and dry.       The dorsal aspect of left foot has mild erythema and mild tenderness to palpation.  No focal fluctuance or crepitus is noted.  Psychiatric: He has a normal mood and affect. His behavior is normal. Thought content normal.    ED Course  Procedures (including critical care time)  Labs Reviewed - No data to display No results found.   No diagnosis found.    MDM  Patient presents with signs of early mild left foot cellulitis.  There is no obvious abscess.  There are no open wounds.  Patient will place on doxycycline for antibiotic treatment.  He's been advised to followup in the next few days for reevaluation particularly if the swelling becomes worse or redness becomes worse.  Patient understands this and has followup at the [place].        Nat Christen, MD 03/01/11 [phone]

## 2012-12-14 ENCOUNTER — Emergency Department (HOSPITAL_COMMUNITY)
Admission: EM | Admit: 2012-12-14 | Discharge: 2012-12-14 | Disposition: A | Discharge status: Home / Self Care | Payer: Medicare Other | Source: Home / Self Care

## 2012-12-14 ENCOUNTER — Encounter (HOSPITAL_COMMUNITY): Payer: Self-pay | Admitting: Emergency Medicine

## 2012-12-14 ENCOUNTER — Emergency Department (INDEPENDENT_AMBULATORY_CARE_PROVIDER_SITE_OTHER): Payer: Medicare Other

## 2012-12-14 DIAGNOSIS — K5900 Constipation, unspecified: Secondary | ICD-10-CM

## 2012-12-14 HISTORY — DX: Constipation, unspecified: K59.00

## 2012-12-14 MED ORDER — SENNA 8.6 MG PO TABS: 2.0000 | ORAL_TABLET | Freq: Every day | ORAL | Status: DC

## 2012-12-14 MED ORDER — POLYETHYLENE GLYCOL 3350 17 GM/SCOOP PO POWD: 17.0000 g | Freq: Three times a day (TID) | ORAL | Status: DC

## 2012-12-14 MED ORDER — POLYETHYLENE GLYCOL 3350 17 GM/SCOOP PO POWD: 17.0000 g | Freq: Every day | ORAL | Status: DC

## 2012-12-14 NOTE — ED Provider Notes (Signed)
CSN: [id number]     Arrival date & time 12/14/12  4098 History   First MD Initiated Contact with Patient 12/14/12 [phone]     Chief Complaint  Patient presents with  . Constipation   (Consider location/radiation/quality/duration/timing/severity/associated sxs/prior Treatment) HPI Comments: 62 year old male with a history of diabetes, hypertension, HIV, chronic constipation presents complaining of constipation, stating that he has not had a normal bowel movement for the past week. He has had small bowel movements that are small hard balls of stool. He states this has happened many times before as a result of taking tramadol. He has been taking fiber supplements, stool softeners, and MiraLax but this has not been helping. He takes the MiraLax once daily. He has some pressure and discomfort in the abdomen no definite pain. He denies any fever, chills, NVD, history of cancers. He has a colonoscopy set up for next month.  Patient is a 62 y.o. male presenting with constipation.  Constipation Associated symptoms: abdominal pain (discomfort)   Associated symptoms: no diarrhea, no dysuria, no fever, no nausea and no vomiting     Past Medical History  Diagnosis Date  . Arthritis   . Diabetes mellitus   . Hypertension   . Asthma   . HIV disease 1995  . Constipation    Past Surgical History  Procedure Laterality Date  . Hip replacment  2010    bilateral  . Joint replacement  2010    Bilateral hip   No family history on file. History  Substance Use Topics  . Smoking status: Former Smoker -- 1.00 packs/day    Types: Cigarettes  . Smokeless tobacco: Never Used  . Alcohol Use: No     Comment: 2 pints a week    Review of Systems  Constitutional: Negative for fever, chills and fatigue.  HENT: Negative for sore throat, neck pain and neck stiffness.   Eyes: Negative for visual disturbance.  Respiratory: Negative for cough and shortness of breath.   Cardiovascular: Negative for chest pain,  palpitations and leg swelling.  Gastrointestinal: Positive for abdominal pain (discomfort) and constipation. Negative for nausea, vomiting and diarrhea.  Genitourinary: Negative for dysuria, urgency, frequency and hematuria.  Musculoskeletal: Negative for myalgias and arthralgias.  Skin: Negative for rash.  Neurological: Negative for dizziness, weakness and light-headedness.    Allergies  Ciprofloxacin and Ciprofloxacin-dexamethasone  Home Medications   Current Outpatient Rx  Name  Route  Sig  Dispense  Refill  . cholecalciferol (VITAMIN D) 1000 UNITS tablet   Oral   Take 1,000 Units by mouth daily.         . darunavir (PREZISTA) 400 MG tablet   Oral   Take 400 mg by mouth.           Marland Kitchen emtricitabine-tenofovir (TRUVADA) 200-300 MG per tablet   Oral   Take 1 tablet by mouth daily.         Marland Kitchen gabapentin (NEURONTIN) 400 MG capsule   Oral   Take 400 mg by mouth 3 (three) times daily.         Marland Kitchen glyBURIDE (DIABETA) 5 MG tablet   Oral   Take 5 mg by mouth daily with breakfast.         . metFORMIN (GLUCOPHAGE) 1000 MG tablet   Oral   Take 1,000 mg by mouth 2 (two) times daily with a meal.           . neomycin-polymyxin-dexameth (MAXITROL) 0.1 % OINT               .  ritonavir (NORVIR) 100 MG capsule   Oral   Take 100 mg by mouth daily.          . sulindac (CLINORIL) 150 MG tablet   Oral   Take 150 mg by mouth 2 (two) times daily.         Marland Kitchen aspirin 81 MG tablet   Oral   Take 81 mg by mouth daily.           . Calcium Carbonate (CALCIUM 600 PO)   Oral   Take 1 tablet by mouth daily.           Marland Kitchen emtricitabine (EMTRIVA) 200 MG capsule   Oral   Take 200 mg by mouth daily.           Marland Kitchen lisinopril (PRINIVIL,ZESTRIL) 2.5 MG tablet   Oral   Take 2.5 mg by mouth daily.           Marland Kitchen lisinopril (PRINIVIL,ZESTRIL) 40 MG tablet   Oral   Take 40 mg by mouth daily.         . polyethylene glycol powder (MIRALAX) powder   Oral   Take 17 g by mouth  3 (three) times daily.   527 g   0   . potassium chloride (KLOR-CON) 20 MEQ packet   Oral   Take 20 mEq by mouth 2 (two) times daily.           . Psyllium (KONSYL-D PO)   Oral   Take by mouth.         . senna (SENOKOT) 8.6 MG TABS tablet   Oral   Take 2-4 tablets (17.2-34.4 mg total) by mouth daily.   120 each   0   . vitamin B-12 (CYANOCOBALAMIN) 1000 MCG tablet   Oral   Take 1,000 mcg by mouth daily.          BP 120/83  Pulse 76  Temp(Src) 97.6 F (36.4 C) (Oral)  Resp 18  SpO2 96% Physical Exam  Nursing note and vitals reviewed. Constitutional: He is oriented to person, place, and time. He appears well-developed and well-nourished. No distress.  HENT:  Head: Normocephalic and atraumatic.  Cardiovascular: Normal rate, regular rhythm and normal heart sounds.  Exam reveals no gallop and no friction rub.   No murmur heard. Pulmonary/Chest: Breath sounds normal. No respiratory distress. He has no wheezes. He has no rales.  Abdominal: Soft. He exhibits distension. He exhibits no mass. Bowel sounds are decreased. There is no tenderness. There is no rebound and no guarding.  Musculoskeletal: Normal range of motion. He exhibits no tenderness.  Neurological: He is alert and oriented to person, place, and time. Coordination normal.  Skin: Skin is warm and dry. No rash noted. He is not diaphoretic.  Psychiatric: He has a normal mood and affect. Judgment normal.    ED Course  Procedures (including critical care time) Labs Review Labs Reviewed - No data to display Imaging Review Dg Abd 1 View  12/14/2012   *RADIOLOGY REPORT*  Clinical Data: Constipation.  ABDOMEN - 1 VIEW  Comparison: Abdominal radiograph 01/13/2008.  Findings: Gas and stool are seen scattered throughout the colon extending to the level of the distal rectum.  No pathologic distension of small bowel is noted.  No gross evidence of pneumoperitoneum.  Moderate stool burden.  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 Multiple pelvic phleboliths.  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  IMPRESSION: 1.  Moderate burden of stool. 2.  Nonobstructive bowel gas pattern. 3.  No gross pneumoperitoneum. 4.  Additional incidental findings, as above.   Original Report Authenticated By: Trudie Reed, M.D.    MDM   1. Constipation    Increase miralax to TID.  Add senokot s.  F/u with GI          Graylon Good, PA-C 12/14/12 1032

## 2012-12-14 NOTE — ED Notes (Signed)
Pt c/o constipation onset 1 week Sxs include: abd pain Denies: f/v/n/d States constipation  Is due to pain meds

## 2012-12-16 NOTE — ED Provider Notes (Signed)
Medical screening examination/treatment/procedure(s) were performed by a resident physician or non-physician practitioner and as the supervising physician I was immediately available for consultation/collaboration.  Clementeen Graham, MD   Rodolph Bong, MD 12/16/12 [phone]

## 2013-07-16 ENCOUNTER — Encounter: Payer: Self-pay | Admitting: *Deleted

## 2013-07-16 NOTE — Telephone Encounter (Signed)
errorneous encounter 

## 2013-09-25 IMAGING — CR DG ABDOMEN 1V
1 series · 1 of 1 positions shown · non-contrast
Comparison: Abdominal radiograph 01/13/2008.

CLINICAL DATA: Constipation.

ABDOMEN - 1 VIEW

[view not recorded]
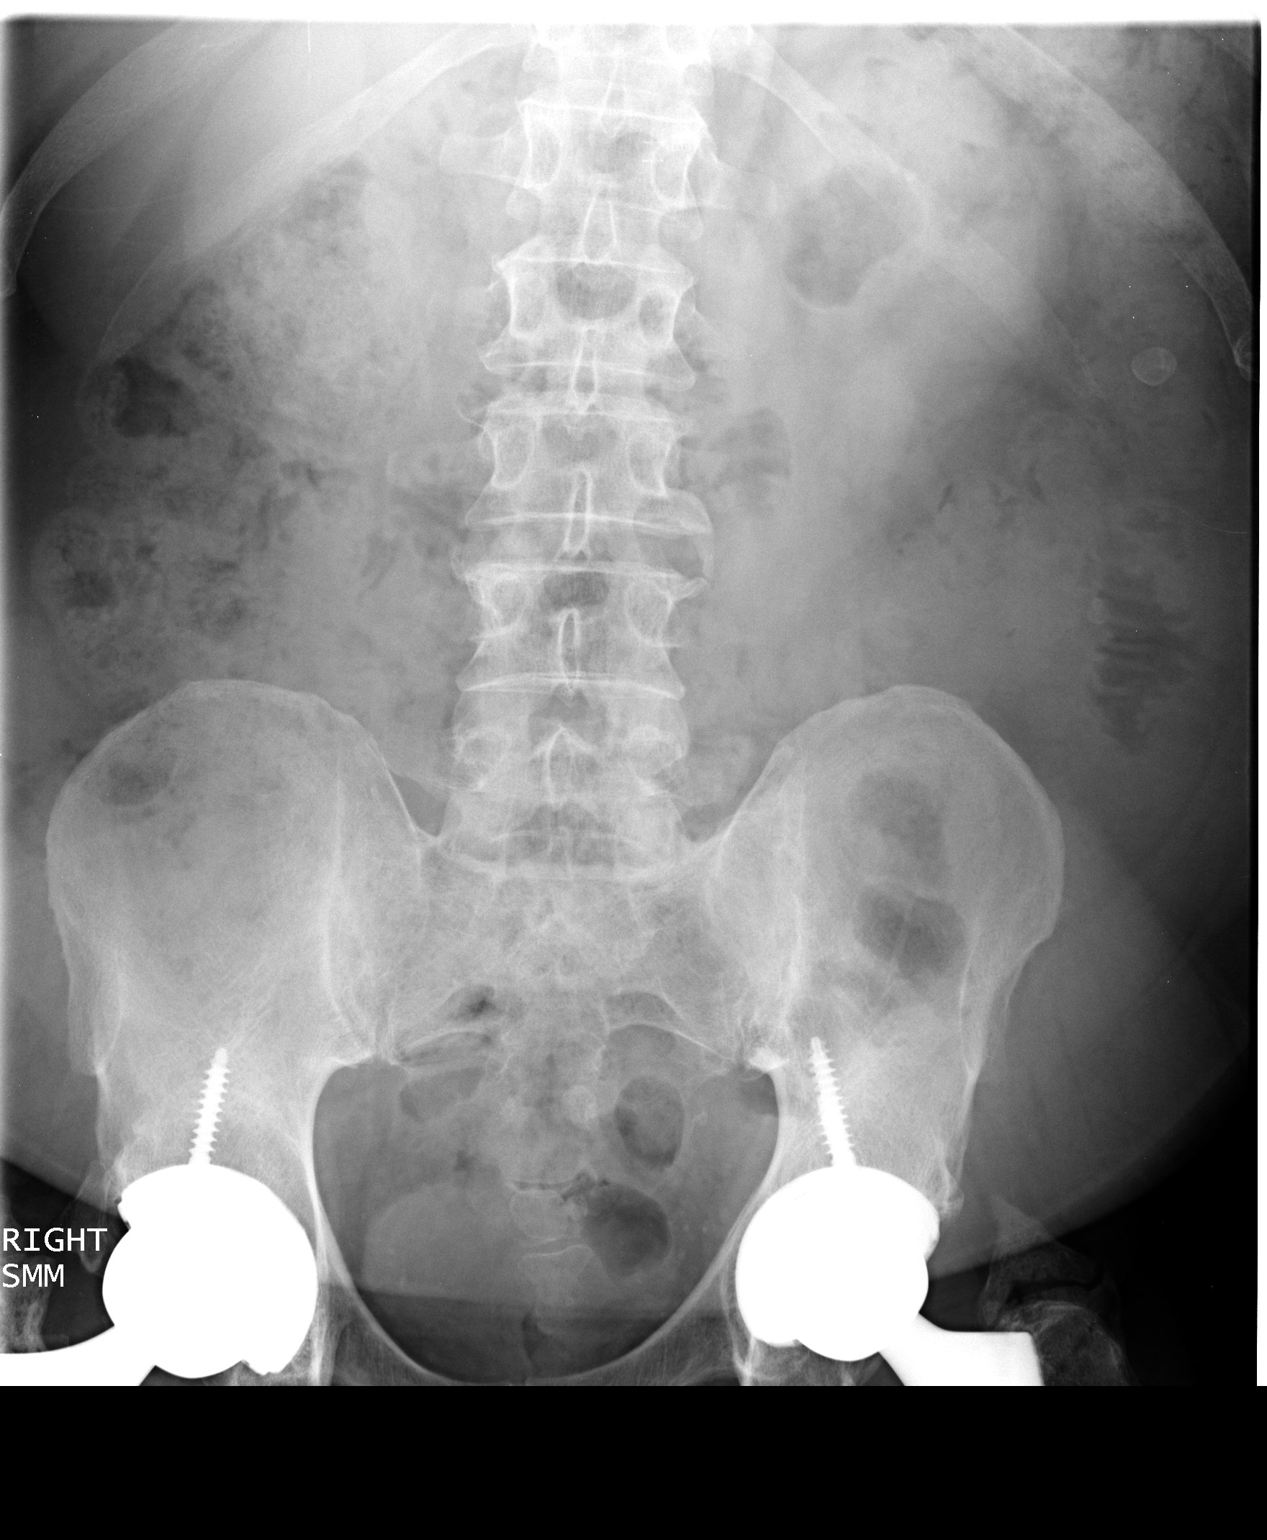

[1 of 1 positions shown; findings below may reference images not displayed]

FINDINGS: Gas and stool are seen scattered throughout the colon
extending to the level of the distal rectum.  No pathologic
distension of small bowel is noted.  No gross evidence of
pneumoperitoneum.  Moderate stool burden.  Rim-calcified structure
projecting over the left side of the mid abdomen, similar to prior
study, favored to represent calcification within a diverticulum.
Multiple pelvic phleboliths.  Postoperative changes of bilateral
total hip arthroplasty are noted (left hip prosthesis is new
compared to the prior study).
IMPRESSION: 1.  Moderate burden of stool.
2.  Nonobstructive bowel gas pattern.
3.  No gross pneumoperitoneum.
4.  Additional incidental findings, as above.

## 2016-07-12 ENCOUNTER — Ambulatory Visit (INDEPENDENT_AMBULATORY_CARE_PROVIDER_SITE_OTHER): Payer: Non-veteran care | Admitting: Podiatry

## 2016-07-12 ENCOUNTER — Encounter: Payer: Self-pay | Admitting: Podiatry

## 2016-07-12 ENCOUNTER — Ambulatory Visit: Payer: Non-veteran care

## 2016-07-12 DIAGNOSIS — E0843 Diabetes mellitus due to underlying condition with diabetic autonomic (poly)neuropathy: Secondary | ICD-10-CM | POA: Diagnosis not present

## 2016-07-12 DIAGNOSIS — M79609 Pain in unspecified limb: Secondary | ICD-10-CM | POA: Diagnosis not present

## 2016-07-12 DIAGNOSIS — M79671 Pain in right foot: Secondary | ICD-10-CM

## 2016-07-12 DIAGNOSIS — L603 Nail dystrophy: Secondary | ICD-10-CM | POA: Diagnosis not present

## 2016-07-12 DIAGNOSIS — L608 Other nail disorders: Secondary | ICD-10-CM

## 2016-07-12 DIAGNOSIS — B351 Tinea unguium: Secondary | ICD-10-CM | POA: Diagnosis not present

## 2016-07-12 DIAGNOSIS — M79672 Pain in left foot: Secondary | ICD-10-CM | POA: Diagnosis not present

## 2016-07-12 NOTE — Progress Notes (Signed)
   SUBJECTIVE Patient with a history of diabetes mellitus presents to office today complaining of elongated, thickened nails. Pain while ambulating in shoes. Patient is unable to trim their own nails.   OBJECTIVE General Patient is awake, alert, and oriented x 3 and in no acute distress. Derm Skin is dry and supple bilateral. Negative open lesions or macerations. Remaining integument unremarkable. Nails are tender, long, thickened and dystrophic with subungual debris, consistent with onychomycosis, 1-5 bilateral. No signs of infection noted. Vasc  DP and PT pedal pulses palpable bilaterally. Temperature gradient within normal limits.  Neuro Epicritic and protective threshold sensation diminished bilaterally.  Musculoskeletal Exam No symptomatic pedal deformities noted bilateral. Muscular strength within normal limits.  ASSESSMENT 1. Diabetes Mellitus w/ peripheral neuropathy 2. Onychomycosis of nail due to dermatophyte bilateral 3. Pain in foot bilateral  PLAN OF CARE 1. Patient evaluated today. 2. Instructed to maintain good pedal hygiene and foot care. Stressed importance of controlling blood sugar.  3. Mechanical debridement of nails 1-5 bilaterally performed using a nail nipper. Filed with dremel without incident.  4. Return to clinic in 3 mos.     Dareion Kneece M. Calypso Hagarty, DPM [facility]  Dr. John Williamsen M. Harold Moncus, DPM    [address]                                        [place], [place] [address]                Office [phone]  Fax [phone]       

## 2016-07-12 NOTE — Addendum Note (Signed)
Addended by: Marcell AngerSPEIGHT, Broly Hatfield P on: 07/12/2016 04:50 PM   Modules accepted: Orders

## 2016-10-13 ENCOUNTER — Ambulatory Visit: Payer: Non-veteran care | Admitting: Podiatry

## 2016-10-18 ENCOUNTER — Ambulatory Visit (INDEPENDENT_AMBULATORY_CARE_PROVIDER_SITE_OTHER): Payer: Non-veteran care | Admitting: Podiatry

## 2016-10-18 ENCOUNTER — Encounter: Payer: Self-pay | Admitting: Podiatry

## 2016-10-18 DIAGNOSIS — E0842 Diabetes mellitus due to underlying condition with diabetic polyneuropathy: Secondary | ICD-10-CM | POA: Diagnosis not present

## 2016-10-18 DIAGNOSIS — M79676 Pain in unspecified toe(s): Secondary | ICD-10-CM | POA: Diagnosis not present

## 2016-10-18 DIAGNOSIS — B351 Tinea unguium: Secondary | ICD-10-CM

## 2016-10-20 NOTE — Progress Notes (Signed)
   SUBJECTIVE Patient with a history of diabetes mellitus presents to office today complaining of elongated, thickened nails. Pain while ambulating in shoes. Patient is unable to trim their own nails.   OBJECTIVE General Patient is awake, alert, and oriented x 3 and in no acute distress. Derm Skin is dry and supple bilateral. Negative open lesions or macerations. Remaining integument unremarkable. Nails are tender, long, thickened and dystrophic with subungual debris, consistent with onychomycosis, 1-5 bilateral. No signs of infection noted. Vasc  DP and PT pedal pulses palpable bilaterally. Temperature gradient within normal limits.  Neuro Epicritic and protective threshold sensation diminished bilaterally.  Musculoskeletal Exam No symptomatic pedal deformities noted bilateral. Muscular strength within normal limits.  ASSESSMENT 1. Diabetes Mellitus w/ peripheral neuropathy 2. Onychomycosis of nail due to dermatophyte bilateral 3. Pain in foot bilateral  PLAN OF CARE 1. Patient evaluated today. 2. Instructed to maintain good pedal hygiene and foot care. Stressed importance of controlling blood sugar.  3. Mechanical debridement of nails 1-5 bilaterally performed using a nail nipper. Filed with dremel without incident.  4. Return to clinic in 3 mos.     Araina Butrick M. Danisa Kopec, DPM [facility]  Dr. Gianina Olinde M. Swetha Rayle, DPM    [address]                                        [place], [place] [address]                Office [phone]  Fax [phone]       

## 2016-11-30 ENCOUNTER — Ambulatory Visit (INDEPENDENT_AMBULATORY_CARE_PROVIDER_SITE_OTHER): Payer: Non-veteran care | Admitting: Podiatry

## 2016-11-30 ENCOUNTER — Encounter: Payer: Self-pay | Admitting: Podiatry

## 2016-11-30 VITALS — Ht 71.0 in | Wt 297.0 lb

## 2016-11-30 DIAGNOSIS — E0842 Diabetes mellitus due to underlying condition with diabetic polyneuropathy: Secondary | ICD-10-CM | POA: Diagnosis not present

## 2016-11-30 DIAGNOSIS — B351 Tinea unguium: Secondary | ICD-10-CM

## 2016-11-30 DIAGNOSIS — M79676 Pain in unspecified toe(s): Principal | ICD-10-CM

## 2016-11-30 DIAGNOSIS — M79671 Pain in right foot: Secondary | ICD-10-CM | POA: Diagnosis not present

## 2016-11-30 DIAGNOSIS — M79672 Pain in left foot: Secondary | ICD-10-CM | POA: Diagnosis not present

## 2016-11-30 NOTE — Progress Notes (Signed)
SUBJECTIVE: 66 y.o. year old IDDM  male presents for diabetic foot care. Been diabetic x 8 years. Recent blood sugar reading stays near 140.   REVIEW OF SYSTEMS: Pertinent items noted in HPI and remainder of comprehensive ROS otherwise negative.  OBJECTIVE: DERMATOLOGIC EXAMINATION: Nails: Thick dystrophic nails x 10. Pre ulcerative skin lesion at distal end 2nd toe left.  VASCULAR EXAMINATION OF LOWER LIMBS: All pedal pulses are palpable with normal pulsation.  Capillary Filling times within 3 seconds in all digits.  No edema or erythema noted. Temperature gradient from tibial crest to dorsum of foot is within normal bilateral.  NEUROLOGIC EXAMINATION OF THE LOWER LIMBS: Achilles DTR is present and within normal. Monofilament (Semmes-Weinstein 10-gm) sensory testing positive 6 out of 6, bilateral. Vibratory sensations(128Hz  turning fork) intact at medial and lateral forefoot bilateral.  Sharp and Dull discriminatory sensations at the plantar ball of hallux is intact bilateral.   MUSCULOSKELETAL EXAMINATION: Positive for severe digital contracture with pre ulcerative corn at distal end 2nd toe left.   ASSESSMENT: Onychomycosis x 10. DM under control. Ulcerating corn 2nd toe left.  PLAN: Debrided all lesions and nails. May return in 2 month for palliation.

## 2016-11-30 NOTE — Patient Instructions (Signed)
Seen for hypertrophic nails and pre ulcerative digital corn 2nd left foot. All nails debrided. Ulcerating lesion debrided. Return in 2 months.

## 2017-01-31 ENCOUNTER — Ambulatory Visit: Payer: Non-veteran care | Admitting: Podiatry

## 2020-01-02 ENCOUNTER — Encounter (HOSPITAL_COMMUNITY): Payer: Self-pay | Admitting: *Deleted

## 2020-01-02 NOTE — Progress Notes (Signed)
Received [facility] Authorization [id number] from Dr. Retta Mac with the [facility] for this pt to participate in Pulmonary rehab with the diagnosis of dyspnea with COVID 19 in 03/2019.  Accompanying medical records reviewed. Clinical review of pt follow up appt on 8/12  Pulmonary office note.  Pt with Covid Risk Score - 5. Pt appropriate for scheduling for Pulmonary rehab.  Will forward to support staff for pulmonary rehab staff for scheduling.  Alanson Aly, BSN Cardiac and Emergency planning/management officer

## 2020-01-09 ENCOUNTER — Telehealth (HOSPITAL_COMMUNITY): Payer: Self-pay | Admitting: *Deleted

## 2020-01-10 ENCOUNTER — Encounter (HOSPITAL_COMMUNITY)
Admission: RE | Admit: 2020-01-10 | Discharge: 2020-01-10 | Disposition: A | Discharge status: Home / Self Care | Payer: No Typology Code available for payment source | Source: Ambulatory Visit | Attending: Cardiology | Admitting: Cardiology

## 2020-01-10 ENCOUNTER — Other Ambulatory Visit: Payer: Self-pay

## 2020-01-10 ENCOUNTER — Encounter (HOSPITAL_COMMUNITY): Payer: Self-pay

## 2020-01-10 VITALS — BP 120/62 | HR 60 | Ht 71.75 in | Wt 280.2 lb

## 2020-01-10 DIAGNOSIS — R0600 Dyspnea, unspecified: Secondary | ICD-10-CM | POA: Insufficient documentation

## 2020-01-10 DIAGNOSIS — R0609 Other forms of dyspnea: Secondary | ICD-10-CM

## 2020-01-10 NOTE — Progress Notes (Signed)
Adam Ponce 69 y.o. male Pulmonary Rehab Orientation Note This patient who was [facility] with the diagnosis of dyspnea on exertion arrived today in Cardiac and Pulmonary Rehab. He arrived ambulatory without his cane as his assistive device with altered gait. He does not carry portable oxygen. Per pt, he uses oxygen never. Color good, skin warm and dry. Patient is oriented to time and place. Patient's medical history, psychosocial health, and medications reviewed. Psychosocial assessment reveals pt lives with an adult companion. Pt is currently retired. Pt hobbies include playing cards, swimming and collecting coins. Pt reports his stress level is low. Areas of stress/anxiety include Health.  Pt does not exhibit signs of depression. PHQ2/9 score 3/8. Pt shows fair  coping skills with positive outlook . Adam Ponce was offered emotional support and reassurance. Will continue to monitor and evaluate progress toward psychosocial goal(s) of being able to do more.Patient reports he does take medications as prescribed. Patient states he follows a Regular diet. The patient reports no specific efforts to gain or lose weight.. Patient's weight will be monitored closely. Demonstration and practice of PLB using pulse oximeter. Patient able to return demonstration satisfactorily. Safety and hand hygiene in the exercise area reviewed with patient. Patient voices understanding of the information reviewed. Department expectations discussed with patient and achievable goals were set. The patient shows enthusiasm about attending the program and we look forward to working with this nice gentleman.  45 minutes was spent on a variety of activities such as assessment of the patient, obtaining baseline data including height, weight, BMI, and grip strength, verifying medical history, allergies, and current medications, and teaching patient strategies for performing tasks with less respiratory effort with emphasis  on pursed lip breathing.  Adam Fredrickson, MS, ACSM-CEP

## 2020-01-10 NOTE — Progress Notes (Signed)
Pulmonary Individual Treatment Plan  Patient Details  Name: Adam Ponce MRN: [id number] Date of Birth: Sep 14, 1950 Referring Provider:    Initial Encounter Date:   Visit Diagnosis: Dyspnea on exertion  Patient's Home Medications on Admission:   Current Outpatient Medications:  .  albuterol (PROVENTIL HFA;VENTOLIN HFA) 108 (90 Base) MCG/ACT inhaler, Inhale into the lungs every 6 (six) hours as needed for wheezing or shortness of breath., Disp: , Rfl:  .  betamethasone valerate (VALISONE) 0.1 % cream, Apply topically 2 (two) times daily., Disp: , Rfl:  .  cetirizine (ZYRTEC) 10 MG tablet, Take 10 mg by mouth daily., Disp: , Rfl:  .  Cholecalciferol (VITAMIN D3) 10000 units TABS, Take by mouth., Disp: , Rfl:  .  ciprofloxacin-dexamethasone (CIPRODEX) otic suspension, 4 drops 2 (two) times daily., Disp: , Rfl:  .  cyanocobalamin (,VITAMIN B-12,) 1000 MCG/ML injection, Inject 1,000 mcg into the muscle once., Disp: , Rfl:  .  darunavir (PREZISTA) 800 MG tablet, Take 800 mg by mouth., Disp: , Rfl:  .  docusate sodium (COLACE) 100 MG capsule, Take 100 mg by mouth 2 (two) times daily., Disp: , Rfl:  .  EMTRICITABINE-TENOFOVIR AF PO, Take by mouth., Disp: , Rfl:  .  fenofibrate (TRICOR) 145 MG tablet, Take 145 mg by mouth daily., Disp: , Rfl:  .  glipiZIDE (GLUCOTROL) 10 MG tablet, Take 10 mg by mouth daily before breakfast., Disp: , Rfl:  .  INSULIN GLARGINE [place], Inject into the skin., Disp: , Rfl:  .  lisinopril (PRINIVIL,ZESTRIL) 40 MG tablet, Take 40 mg by mouth daily., Disp: , Rfl:  .  magnesium citrate SOLN, Take 1 Bottle by mouth once., Disp: , Rfl:  .  metFORMIN (GLUCOPHAGE) 1000 MG tablet, Take 1,000 mg by mouth 2 (two) times daily with a meal., Disp: , Rfl:  .  pravastatin (PRAVACHOL) 80 MG tablet, Take 80 mg by mouth daily., Disp: , Rfl:  .  pregabalin (LYRICA) 75 MG capsule, Take 75 mg by mouth 2 (two) times daily., Disp: , Rfl:  .  Psyllium 48.57 % POWD, Take by mouth., Disp: , Rfl:    .  ranitidine (ZANTAC) 150 MG capsule, Take 150 mg by mouth 2 (two) times daily., Disp: , Rfl:  .  ritonavir (NORVIR) 100 MG TABS tablet, Take by mouth., Disp: , Rfl:  .  sodium chloride (OCEAN) 0.65 % nasal spray, Place 1 spray into the nose as needed for congestion., Disp: , Rfl:  .  traMADol (ULTRAM) 50 MG tablet, Take by mouth every 6 (six) hours as needed., Disp: , Rfl:  .  warfarin (COUMADIN) 5 MG tablet, Take 5 mg by mouth daily., Disp: , Rfl:   Past Medical History: Past Medical History:  Diagnosis Date  . Arthritis   . Asthma   . Constipation   . Diabetes mellitus   . HIV disease (HCC) 1995  . Hypertension     Tobacco Use: Social History   Tobacco Use  Smoking Status Former Smoker  . Packs/day: 1.00  . Types: Cigarettes  Smokeless Tobacco Never Used    Labs: Recent Review Flowsheet Data   There is no flowsheet data to display.     Capillary Blood Glucose: Lab Results  Component Value Date   GLUCAP 100 (H) 08/23/2008   GLUCAP 118 (H) 08/23/2008   GLUCAP 116 (H) 08/22/2008   GLUCAP 101 (H) 08/22/2008   GLUCAP 103 (H) 08/22/2008     Pulmonary Assessment Scores:  UCSD: Self-administered rating of dyspnea  associated with activities of daily living (ADLs) 6-point scale (0 = "not at all" to 5 = "maximal or unable to do because of breathlessness")  Scoring Scores range from 0 to 120.  Minimally important difference is 5 units  CAT: CAT can identify the health impairment of COPD patients and is better correlated with disease progression.  CAT has a scoring range of zero to 40. The CAT score is classified into four groups of low (less than 10), medium (10 - 20), high (21-30) and very high (31-40) based on the impact level of disease on health status. A CAT score over 10 suggests significant symptoms.  A worsening CAT score could be explained by an exacerbation, poor medication adherence, poor inhaler technique, or progression of COPD or comorbid conditions.  CAT  MCID is 2 points  mMRC: mMRC (Modified Medical Research Council) Dyspnea Scale is used to assess the degree of baseline functional disability in patients of respiratory disease due to dyspnea. No minimal important difference is established. A decrease in score of 1 point or greater is considered a positive change.   Pulmonary Function Assessment:   Exercise Target Goals: Exercise Program Goal: Individual exercise prescription set using results from initial 6 min walk test and THRR while considering  patient's activity barriers and safety.   Exercise Prescription Goal: Initial exercise prescription builds to 30-45 minutes a day of aerobic activity, 2-3 days per week.  Home exercise guidelines will be given to patient during program as part of exercise prescription that the participant will acknowledge.  Activity Barriers & Risk Stratification:  Activity Barriers & Cardiac Risk Stratification - 01/10/20 1053      Activity Barriers & Cardiac Risk Stratification   Activity Barriers Arthritis;Joint Problems;Right Hip Replacement;Left Hip Replacement;Deconditioning;Assistive Device    Cardiac Risk Stratification High           6 Minute Walk:  6 Minute Walk    Row Name 01/10/20 1217         6 Minute Walk   Phase Initial     Distance 942 feet     Walk Time 6 minutes     # of Rest Breaks 0     MPH 1.78     METS 2.03     RPE 13     Perceived Dyspnea  1     VO2 Peak 7.1     Symptoms No     Resting HR 60 bpm     Resting BP 126/62     Resting Oxygen Saturation  96 %     Exercise Oxygen Saturation  during 6 min walk 92 %     Max Ex. HR 126 bpm     Max Ex. BP 140/56     2 Minute Post BP 136/60       Interval HR   1 Minute HR 89     2 Minute HR 95     3 Minute HR 94     4 Minute HR 107     5 Minute HR 122     6 Minute HR 126     2 Minute Post HR 60     Interval Heart Rate? Yes       Interval Oxygen   Interval Oxygen? Yes     Baseline Oxygen Saturation % 96 %     1  Minute Oxygen Saturation % 94 %     1 Minute Liters of Oxygen 0 L     2 Minute Oxygen  Saturation % 94 %     2 Minute Liters of Oxygen 0 L     3 Minute Oxygen Saturation % 92 %     3 Minute Liters of Oxygen 0 L     4 Minute Oxygen Saturation % 94 %     4 Minute Liters of Oxygen 0 L     5 Minute Oxygen Saturation % 94 %     5 Minute Liters of Oxygen 0 L     6 Minute Oxygen Saturation % 95 %     6 Minute Liters of Oxygen 0 L     2 Minute Post Oxygen Saturation % 97 %     2 Minute Post Liters of Oxygen 0 L            Oxygen Initial Assessment:  Oxygen Initial Assessment - 01/10/20 1217      Initial 6 min Walk   Oxygen Used None      Program Oxygen Prescription   Program Oxygen Prescription None           Oxygen Re-Evaluation:   Oxygen Discharge (Final Oxygen Re-Evaluation):   Initial Exercise Prescription:   Perform Capillary Blood Glucose checks as needed.  Exercise Prescription Changes:   Exercise Comments:   Exercise Goals and Review:  Exercise Goals    Row Name 01/10/20 1216             Exercise Goals   Increase Physical Activity Yes       Intervention Provide advice, education, support and counseling about physical activity/exercise needs.;Develop an individualized exercise prescription for aerobic and resistive training based on initial evaluation findings, risk stratification, comorbidities and participant's personal goals.       Expected Outcomes Short Term: Attend rehab on a regular basis to increase amount of physical activity.;Long Term: Add in home exercise to make exercise part of routine and to increase amount of physical activity.;Long Term: Exercising regularly at least 3-5 days a week.       Increase Strength and Stamina Yes       Intervention Provide advice, education, support and counseling about physical activity/exercise needs.;Develop an individualized exercise prescription for aerobic and resistive training based on initial evaluation  findings, risk stratification, comorbidities and participant's personal goals.       Expected Outcomes Short Term: Increase workloads from initial exercise prescription for resistance, speed, and METs.;Short Term: Perform resistance training exercises routinely during rehab and add in resistance training at home;Long Term: Improve cardiorespiratory fitness, muscular endurance and strength as measured by increased METs and functional capacity ( )       Able to understand and use rate of perceived exertion (RPE) scale Yes       Intervention Provide education and explanation on how to use RPE scale       Expected Outcomes Short Term: Able to use RPE daily in rehab to express subjective intensity level;Long Term:  Able to use RPE to guide intensity level when exercising independently       Able to understand and use Dyspnea scale Yes       Intervention Provide education and explanation on how to use Dyspnea scale       Expected Outcomes Short Term: Able to use Dyspnea scale daily in rehab to express subjective sense of shortness of breath during exertion;Long Term: Able to use Dyspnea scale to guide intensity level when exercising independently       Knowledge and understanding of Target Heart Rate Range (THRR)  Yes       Intervention Provide education and explanation of THRR including how the numbers were predicted and where they are located for reference       Expected Outcomes Short Term: Able to state/look up THRR;Long Term: Able to use THRR to govern intensity when exercising independently;Short Term: Able to use daily as guideline for intensity in rehab       Understanding of Exercise Prescription Yes       Intervention Provide education, explanation, and written materials on patient's individual exercise prescription       Expected Outcomes Short Term: Able to explain program exercise prescription;Long Term: Able to explain home exercise prescription to exercise independently               Exercise Goals Re-Evaluation :   Discharge Exercise Prescription (Final Exercise Prescription Changes):   Nutrition:  Target Goals: Understanding of nutrition guidelines, daily intake of sodium 1500mg , cholesterol 200mg , calories 30% from fat and 7% or less from saturated fats, daily to have 5 or more servings of fruits and vegetables.  Biometrics:  Pre Biometrics - 01/10/20 1615      Pre Biometrics   Grip Strength 33 kg            Nutrition Therapy Plan and Nutrition Goals:   Nutrition Assessments:   Nutrition Goals Re-Evaluation:   Nutrition Goals Discharge (Final Nutrition Goals Re-Evaluation):   Psychosocial: Target Goals: Acknowledge presence or absence of significant depression and/or stress, maximize coping skills, provide positive support system. Participant is able to verbalize types and ability to use techniques and skills needed for reducing stress and depression.  Initial Review & Psychosocial Screening:  Initial Psych Review & Screening - 01/10/20 1056      Initial Review   Current issues with Current Stress Concerns    Source of Stress Concerns Unable to participate in former interests or hobbies;Unable to perform yard/household activities    Comments COVID is a big stressor for him. Unable to do things because of illness      Family Dynamics   Good Support System? Yes   His friends and roomate is his support system     Barriers   Psychosocial barriers to participate in program The patient should benefit from training in stress management and relaxation.      Screening Interventions   Interventions Encouraged to exercise    Expected Outcomes Short Term goal: Utilizing psychosocial counselor, staff and physician to assist with identification of specific Stressors or current issues interfering with healing process. Setting desired goal for each stressor or current issue identified.;Long Term Goal: Stressors or current issues are controlled or  eliminated.;Short Term goal: Identification and review with participant of any Quality of Life or Depression concerns found by scoring the questionnaire.;Long Term goal: The participant improves quality of Life and PHQ9 Scores as seen by post scores and/or verbalization of changes           Quality of Life Scores:  Scores of 19 and below usually indicate a poorer quality of life in these areas.  A difference of  2-3 points is a clinically meaningful difference.  A difference of 2-3 points in the total score of the Quality of Life Index has been associated with significant improvement in overall quality of life, self-image, physical symptoms, and general health in studies assessing change in quality of life.  PHQ-9: Recent Review Flowsheet Data    Depression screen [facility] 2/9 01/10/2020   Decreased Interest 2  Down, Depressed, Hopeless 1   PHQ - 2 Score 3   Altered sleeping 1   Tired, decreased energy 1   Change in appetite 2   Feeling bad or failure about yourself  1   Trouble concentrating 0   Moving slowly or fidgety/restless 0   Suicidal thoughts 0   PHQ-9 Score 8   Difficult doing work/chores Somewhat difficult     Interpretation of Total Score  Total Score Depression Severity:  1-4 = Minimal depression, 5-9 = Mild depression, 10-14 = Moderate depression, 15-19 = Moderately severe depression, 20-27 = Severe depression   Psychosocial Evaluation and Intervention:   Psychosocial Re-Evaluation:   Psychosocial Discharge (Final Psychosocial Re-Evaluation):   Education: Education Goals: Education classes will be provided on a weekly basis, covering required topics. Participant will state understanding/return demonstration of topics presented.  Learning Barriers/Preferences:  Learning Barriers/Preferences - 01/10/20 1102      Learning Barriers/Preferences   Learning Barriers Hearing   Right ear hearing problems   Learning Preferences Group Instruction;Skilled  Demonstration;Written Material;Video           Education Topics: Risk Factor Reduction:  -Group instruction that is supported by a PowerPoint presentation. Instructor discusses the definition of a risk factor, different risk factors for pulmonary disease, and how the heart and lungs work together.     Nutrition for Pulmonary Patient:  -Group instruction provided by PowerPoint slides, verbal discussion, and written materials to support subject matter. The instructor gives an explanation and review of healthy diet recommendations, which includes a discussion on weight management, recommendations for fruit and vegetable consumption, as well as protein, fluid, caffeine, fiber, sodium, sugar, and alcohol. Tips for eating when patients are short of breath are discussed.   Pursed Lip Breathing:  -Group instruction that is supported by demonstration and informational handouts. Instructor discusses the benefits of pursed lip and diaphragmatic breathing and detailed demonstration on how to preform both.     Oxygen Safety:  -Group instruction provided by PowerPoint, verbal discussion, and written material to support subject matter. There is an overview of "What is Oxygen" and "Why do we need it".  Instructor also reviews how to create a safe environment for oxygen use, the importance of using oxygen as prescribed, and the risks of noncompliance. There is a brief discussion on traveling with oxygen and resources the patient may utilize.   Oxygen Equipment:  -Group instruction provided by [facility] Staff utilizing handouts, written materials, and equipment demonstrations.   Signs and Symptoms:  -Group instruction provided by written material and verbal discussion to support subject matter. Warning signs and symptoms of infection, stroke, and heart attack are reviewed and when to call the physician/911 reinforced. Tips for preventing the spread of infection discussed.   Advanced Directives:    -Group instruction provided by verbal instruction and written material to support subject matter. Instructor reviews Advanced Directive laws and proper instruction for filling out document.   Pulmonary Video:  -Group video education that reviews the importance of medication and oxygen compliance, exercise, good nutrition, pulmonary hygiene, and pursed lip and diaphragmatic breathing for the pulmonary patient.   Exercise for the Pulmonary Patient:  -Group instruction that is supported by a PowerPoint presentation. Instructor discusses benefits of exercise, core components of exercise, frequency, duration, and intensity of an exercise routine, importance of utilizing pulse oximetry during exercise, safety while exercising, and options of places to exercise outside of rehab.     Pulmonary Medications:  -Verbally interactive group education provided  by instructor with focus on inhaled medications and proper administration.   Anatomy and Physiology of the Respiratory System and Intimacy:  -Group instruction provided by PowerPoint, verbal discussion, and written material to support subject matter. Instructor reviews respiratory cycle and anatomical components of the respiratory system and their functions. Instructor also reviews differences in obstructive and restrictive respiratory diseases with examples of each. Intimacy, Sex, and Sexuality differences are reviewed with a discussion on how relationships can change when diagnosed with pulmonary disease. Common sexual concerns are reviewed.   MD DAY -A group question and answer session with a medical doctor that allows participants to ask questions that relate to their pulmonary disease state.   OTHER EDUCATION -Group or individual verbal, written, or video instructions that support the educational goals of the pulmonary rehab program.   Holiday Eating Survival Tips:  -Group instruction provided by PowerPoint slides, verbal discussion, and  written materials to support subject matter. The instructor gives patients tips, tricks, and techniques to help them not only survive but enjoy the holidays despite the onslaught of food that accompanies the holidays.   Knowledge Questionnaire Score:   Core Components/Risk Factors/Patient Goals at Admission:   Core Components/Risk Factors/Patient Goals Review:    Core Components/Risk Factors/Patient Goals at Discharge (Final Review):    ITP Comments:   Comments:

## 2020-01-10 NOTE — Progress Notes (Signed)
Pulmonary Rehab Orientation Physical Assessment Note  Physical assessment reveals heart rate is normal, breath sounds clear to auscultation, no wheezes, rales, or rhonchi. Grip strength equal, strong. Distal pulses palpable. Adam Ponce wants to increase his endurance and states that he needs knee surgery.He would also like to get his blood sugars under better control. We are looking forward to helping reach his goals with Pulmonary Rehab.

## 2020-01-15 ENCOUNTER — Other Ambulatory Visit: Payer: Self-pay

## 2020-01-15 ENCOUNTER — Encounter (HOSPITAL_COMMUNITY)
Admission: RE | Admit: 2020-01-15 | Discharge: 2020-01-15 | Disposition: A | Discharge status: Home / Self Care | Payer: No Typology Code available for payment source | Source: Ambulatory Visit | Attending: Cardiology | Admitting: Cardiology

## 2020-01-15 DIAGNOSIS — R0609 Other forms of dyspnea: Secondary | ICD-10-CM

## 2020-01-15 DIAGNOSIS — R0600 Dyspnea, unspecified: Secondary | ICD-10-CM | POA: Diagnosis not present

## 2020-01-15 NOTE — Progress Notes (Signed)
Daily Session Note  Patient Details  Name: LILIANA BRENTLINGER MRN: [id number] Date of Birth: October 01, 1950 Referring Provider:     Pulmonary Rehab Walk Test from 01/10/2020 in [place]  Referring Provider Dr. Radford Pax ([place])      Encounter Date: 01/15/2020  Check In:  Session Check In - 01/15/20 1420      Check-In   Supervising physician immediately available to respond to emergencies [facility] immediately available    Physician(s) Dr. Doristine Bosworth    Location [facility]    Staff Present Hoy Register, MS, CEP, Exercise Physiologist;David Lilyan Punt, MS, EP-C, CCRP;Keimari [place] Hassell Done, MS, ACSM-CEP, Exercise Physiologist;Portia Rollene Rotunda, RN, BSN    Virtual Visit No    Medication changes reported     No    Fall or balance concerns reported    No    Tobacco Cessation No Change    Warm-up and Cool-down Performed on first and last piece of equipment    Resistance Training Performed Yes    VAD Patient? No    PAD/SET Patient? No      Pain Assessment   Currently in Pain? No/denies    Pain Score 0-No pain    Multiple Pain Sites No           Capillary Blood Glucose: No results found for this or any previous visit (from the past 24 hour(s)).    Social History   Tobacco Use  Smoking Status Former Smoker  . Packs/day: 1.00  . Types: Cigarettes  Smokeless Tobacco Never Used    Goals Met:  Proper associated with RPD/PD & O2 Sat Exercise tolerated well No report of cardiac concerns or symptoms Strength training completed today  Goals Unmet:  Not Applicable  Comments: Service time is from 1305 to 1430    Dr. Fransico Him is Medical Director for Cardiac Rehab at [facility].

## 2020-01-17 ENCOUNTER — Encounter (HOSPITAL_COMMUNITY)
Admission: RE | Admit: 2020-01-17 | Discharge: 2020-01-17 | Disposition: A | Discharge status: Home / Self Care | Payer: No Typology Code available for payment source | Source: Ambulatory Visit | Attending: Cardiology | Admitting: Cardiology

## 2020-01-17 ENCOUNTER — Other Ambulatory Visit: Payer: Self-pay

## 2020-01-17 DIAGNOSIS — R0600 Dyspnea, unspecified: Secondary | ICD-10-CM | POA: Diagnosis not present

## 2020-01-17 DIAGNOSIS — R0609 Other forms of dyspnea: Secondary | ICD-10-CM

## 2020-01-17 NOTE — Progress Notes (Addendum)
Adam Ponce 69 y.o. male Nutrition Note  Visit Diagnosis: Dyspnea on exertion  Past Medical History:  Diagnosis Date  . Arthritis   . Asthma   . Constipation   . Diabetes mellitus   . HIV disease (HCC) 1995  . Hypertension      Medications reviewed.   Current Outpatient Medications:  .  albuterol (PROVENTIL HFA;VENTOLIN HFA) 108 (90 Base) MCG/ACT inhaler, Inhale into the lungs every 6 (six) hours as needed for wheezing or shortness of breath., Disp: , Rfl:  .  betamethasone valerate (VALISONE) 0.1 % cream, Apply topically 2 (two) times daily., Disp: , Rfl:  .  cetirizine (ZYRTEC) 10 MG tablet, Take 10 mg by mouth daily., Disp: , Rfl:  .  Cholecalciferol (VITAMIN D3) 10000 units TABS, Take by mouth., Disp: , Rfl:  .  ciprofloxacin-dexamethasone (CIPRODEX) otic suspension, 4 drops 2 (two) times daily., Disp: , Rfl:  .  cyanocobalamin (,VITAMIN B-12,) 1000 MCG/ML injection, Inject 1,000 mcg into the muscle once., Disp: , Rfl:  .  darunavir (PREZISTA) 800 MG tablet, Take 800 mg by mouth., Disp: , Rfl:  .  docusate sodium (COLACE) 100 MG capsule, Take 100 mg by mouth 2 (two) times daily., Disp: , Rfl:  .  EMTRICITABINE-TENOFOVIR AF PO, Take by mouth., Disp: , Rfl:  .  fenofibrate (TRICOR) 145 MG tablet, Take 145 mg by mouth daily., Disp: , Rfl:  .  glipiZIDE (GLUCOTROL) 10 MG tablet, Take 10 mg by mouth daily before breakfast., Disp: , Rfl:  .  INSULIN GLARGINE [place], Inject into the skin., Disp: , Rfl:  .  lisinopril (PRINIVIL,ZESTRIL) 40 MG tablet, Take 40 mg by mouth daily., Disp: , Rfl:  .  magnesium citrate SOLN, Take 1 Bottle by mouth once., Disp: , Rfl:  .  metFORMIN (GLUCOPHAGE) 1000 MG tablet, Take 1,000 mg by mouth 2 (two) times daily with a meal., Disp: , Rfl:  .  pravastatin (PRAVACHOL) 80 MG tablet, Take 80 mg by mouth daily., Disp: , Rfl:  .  pregabalin (LYRICA) 75 MG capsule, Take 75 mg by mouth 2 (two) times daily., Disp: , Rfl:  .  Psyllium 48.57 % POWD, Take by mouth.,  Disp: , Rfl:  .  ranitidine (ZANTAC) 150 MG capsule, Take 150 mg by mouth 2 (two) times daily., Disp: , Rfl:  .  ritonavir (NORVIR) 100 MG TABS tablet, Take by mouth., Disp: , Rfl:  .  sodium chloride (OCEAN) 0.65 % nasal spray, Place 1 spray into the nose as needed for congestion., Disp: , Rfl:  .  traMADol (ULTRAM) 50 MG tablet, Take by mouth every 6 (six) hours as needed., Disp: , Rfl:  .  warfarin (COUMADIN) 5 MG tablet, Take 5 mg by mouth daily., Disp: , Rfl:    Ht Readings from Last 1 Encounters:  01/10/20 5' 11.75" (1.822 m)     Wt Readings from Last 3 Encounters:  01/10/20 280 lb 3.3 oz (127.1 kg)  11/30/16 297 lb (134.7 kg)  03/01/11 215 lb (97.5 kg)     There is no height or weight on file to calculate BMI.   Social History   Tobacco Use  Smoking Status Former Smoker  . Packs/day: 1.00  . Types: Cigarettes  Smokeless Tobacco Never Used     No results found for: CHOL No results found for: HDL No results found for: LDLCALC No results found for: TRIG   No results found for: HGBA1C   CBG (last 3)  No results for  input(s): GLUCAP in the last 72 hours.   Nutrition Note  Spoke with pt. Nutrition Plan and Nutrition Survey goals reviewed with pt. Pt is following a Heart Healthy diet. Pt wants to lose wt.   Pt has Type 2 Diabetes. Pt checks CBG's 1 times a day. Fasting CBG's reportedly 160-190 mg/dL.   Pt has had previous diabetes education. He sees a RD at the [place].  Poor dentition - cannot eat many raw veggies, nuts, etc.   He states goal of weight loss and blood sugar management because he wants to have a knee replacement and gastric bypass.  Reviewed weight history. Eating out often. Trying to avoid diet sodas because he has GERD. Identified barriers to making diet changes as environment, lack of support for a healthy diet from roommate, and enjoying foods he grew up with.  He does not cook in oven during summer due to [facility] bill going up.  He does use stove top.   He feels proficient in cooking.   Pt expressed understanding of the information reviewed.    Nutrition Diagnosis ? Excessive carbohydrate intake related to consumption of convenience foods as evidenced by A1C 9.0 and fasting CBG 160-180 mg/dl   Nutrition Intervention ? Pt's individual nutrition plan reviewed with pt. ? Benefits of adopting Heart Healthy diet discussed when [employer] reviewed.   ? Continue client-centered nutrition education by RD, as part of interdisciplinary care.  Goal(s) ? Pt to identify food quantities necessary to achieve weight loss of 6-24 lb at graduation from cardiac rehab.  ? CBG concentrations in the normal range or as close to normal as is safely possible. ? Improved blood glucose control as evidenced by pt's A1c trending from 9.0 toward less than 7.0.  Plan:    Will provide client-centered nutrition education as part of interdisciplinary care  Monitor and evaluate progress toward nutrition goal with team.   Andrey Campanile, MS, RDN, LDN

## 2020-01-17 NOTE — Progress Notes (Signed)
Pulmonary Individual Treatment Plan  Patient Details  Name: Adam Ponce MRN: [id number] Date of Birth: [birth year] Referring Provider:     Pulmonary Rehab Walk Test from 01/10/2020 in [facility] CARDIAC [facility]  Referring Provider Dr. Mayford Knife ([place])      Initial Encounter Date:    Pulmonary Rehab Walk Test from 01/10/2020 in [facility] CARDIAC REHAB  Date 01/10/20      Visit Diagnosis: Dyspnea on exertion  Patient's Home Medications on Admission:   Current Outpatient Medications:  .  albuterol (PROVENTIL HFA;VENTOLIN HFA) 108 (90 Base) MCG/ACT inhaler, Inhale into the lungs every 6 (six) hours as needed for wheezing or shortness of breath., Disp: , Rfl:  .  betamethasone valerate (VALISONE) 0.1 % cream, Apply topically 2 (two) times daily., Disp: , Rfl:  .  cetirizine (ZYRTEC) 10 MG tablet, Take 10 mg by mouth daily., Disp: , Rfl:  .  Cholecalciferol (VITAMIN D3) 10000 units TABS, Take by mouth., Disp: , Rfl:  .  ciprofloxacin-dexamethasone (CIPRODEX) otic suspension, 4 drops 2 (two) times daily., Disp: , Rfl:  .  cyanocobalamin (,VITAMIN B-12,) 1000 MCG/ML injection, Inject 1,000 mcg into the muscle once., Disp: , Rfl:  .  darunavir (PREZISTA) 800 MG tablet, Take 800 mg by mouth., Disp: , Rfl:  .  docusate sodium (COLACE) 100 MG capsule, Take 100 mg by mouth 2 (two) times daily., Disp: , Rfl:  .  EMTRICITABINE-TENOFOVIR AF PO, Take by mouth., Disp: , Rfl:  .  fenofibrate (TRICOR) 145 MG tablet, Take 145 mg by mouth daily., Disp: , Rfl:  .  glipiZIDE (GLUCOTROL) 10 MG tablet, Take 10 mg by mouth daily before breakfast., Disp: , Rfl:  .  INSULIN GLARGINE Armstrong, Inject into the skin., Disp: , Rfl:  .  lisinopril (PRINIVIL,ZESTRIL) 40 MG tablet, Take 40 mg by mouth daily., Disp: , Rfl:  .  magnesium citrate SOLN, Take 1 Bottle by mouth once., Disp: , Rfl:  .  metFORMIN (GLUCOPHAGE) 1000 MG tablet, Take 1,000 mg by mouth 2 (two) times daily with a meal., Disp:  , Rfl:  .  pravastatin (PRAVACHOL) 80 MG tablet, Take 80 mg by mouth daily., Disp: , Rfl:  .  pregabalin (LYRICA) 75 MG capsule, Take 75 mg by mouth 2 (two) times daily., Disp: , Rfl:  .  Psyllium 48.57 % POWD, Take by mouth., Disp: , Rfl:  .  ranitidine (ZANTAC) 150 MG capsule, Take 150 mg by mouth 2 (two) times daily., Disp: , Rfl:  .  ritonavir (NORVIR) 100 MG TABS tablet, Take by mouth., Disp: , Rfl:  .  sodium chloride (OCEAN) 0.65 % nasal spray, Place 1 spray into the nose as needed for congestion., Disp: , Rfl:  .  traMADol (ULTRAM) 50 MG tablet, Take by mouth every 6 (six) hours as needed., Disp: , Rfl:  .  warfarin (COUMADIN) 5 MG tablet, Take 5 mg by mouth daily., Disp: , Rfl:   Past Medical History: Past Medical History:  Diagnosis Date  . Arthritis   . Asthma   . Constipation   . Diabetes mellitus   . HIV disease (HCC) 1995  . Hypertension     Tobacco Use: Social History   Tobacco Use  Smoking Status Former Smoker  . Packs/day: 1.00  . Types: Cigarettes  Smokeless Tobacco Never Used    Labs: Recent Review Flowsheet Data   There is no flowsheet data to display.     Capillary Blood Glucose: Lab Results  Component Value Date   GLUCAP 100 (H) 08/23/2008   GLUCAP 118 (H) 08/23/2008   GLUCAP 116 (H) 08/22/2008   GLUCAP 101 (H) 08/22/2008   GLUCAP 103 (H) 08/22/2008     Pulmonary Assessment Scores:  Pulmonary Assessment Scores    Row Name 01/10/20 1621 01/10/20 1642       ADL UCSD   ADL Phase Entry --    SOB Score total 61 --      CAT Score   CAT Score 8  entry --      mMRC Score   mMRC Score -- 4          UCSD: Self-administered rating of dyspnea associated with activities of daily living (ADLs) 6-point scale (0 = "not at all" to 5 = "maximal or unable to do because of breathlessness")  Scoring Scores range from 0 to 120.  Minimally important difference is 5 units  CAT: CAT can identify the health impairment of COPD patients and is better  correlated with disease progression.  CAT has a scoring range of zero to 40. The CAT score is classified into four groups of low (less than 10), medium (10 - 20), high (21-30) and very high (31-40) based on the impact level of disease on health status. A CAT score over 10 suggests significant symptoms.  A worsening CAT score could be explained by an exacerbation, poor medication adherence, poor inhaler technique, or progression of COPD or comorbid conditions.  CAT MCID is 2 points  mMRC: mMRC (Modified Medical Research Council) Dyspnea Scale is used to assess the degree of baseline functional disability in patients of respiratory disease due to dyspnea. No minimal important difference is established. A decrease in score of 1 point or greater is considered a positive change.   Pulmonary Function Assessment:   Exercise Target Goals: Exercise Program Goal: Individual exercise prescription set using results from initial 6 min walk test and THRR while considering  patient's activity barriers and safety.   Exercise Prescription Goal: Initial exercise prescription builds to 30-45 minutes a day of aerobic activity, 2-3 days per week.  Home exercise guidelines will be given to patient during program as part of exercise prescription that the participant will acknowledge.  Activity Barriers & Risk Stratification:  Activity Barriers & Cardiac Risk Stratification - 01/10/20 1053      Activity Barriers & Cardiac Risk Stratification   Activity Barriers Arthritis;Joint Problems;Right Hip Replacement;Left Hip Replacement;Deconditioning;Assistive Device    Cardiac Risk Stratification High           6 Minute Walk:  6 Minute Walk    Row Name 01/10/20 1217         6 Minute Walk   Phase Initial     Distance 942 feet     Walk Time 6 minutes     # of Rest Breaks 0     MPH 1.78     METS 2.03     RPE 13     Perceived Dyspnea  1     VO2 Peak 7.1     Symptoms No     Resting HR 60 bpm     Resting  BP 126/62     Resting Oxygen Saturation  96 %     Exercise Oxygen Saturation  during 6 min walk 92 %     Max Ex. HR 126 bpm     Max Ex. BP 140/56     2 Minute Post BP 136/60  Interval HR   1 Minute HR 89     2 Minute HR 95     3 Minute HR 94     4 Minute HR 107     5 Minute HR 122     6 Minute HR 126     2 Minute Post HR 60     Interval Heart Rate? Yes       Interval Oxygen   Interval Oxygen? Yes     Baseline Oxygen Saturation % 96 %     1 Minute Oxygen Saturation % 94 %     1 Minute Liters of Oxygen 0 L     2 Minute Oxygen Saturation % 94 %     2 Minute Liters of Oxygen 0 L     3 Minute Oxygen Saturation % 92 %     3 Minute Liters of Oxygen 0 L     4 Minute Oxygen Saturation % 94 %     4 Minute Liters of Oxygen 0 L     5 Minute Oxygen Saturation % 94 %     5 Minute Liters of Oxygen 0 L     6 Minute Oxygen Saturation % 95 %     6 Minute Liters of Oxygen 0 L     2 Minute Post Oxygen Saturation % 97 %     2 Minute Post Liters of Oxygen 0 L            Oxygen Initial Assessment:  Oxygen Initial Assessment - 01/10/20 1217      Initial 6 min Walk   Oxygen Used None      Program Oxygen Prescription   Program Oxygen Prescription None           Oxygen Re-Evaluation:  Oxygen Re-Evaluation    Row Name 01/15/20 1625             Program Oxygen Prescription   Program Oxygen Prescription None         Home Oxygen   Home Oxygen Device None       Sleep Oxygen Prescription CPAP       Liters per minute --  does not use       Home Exercise Oxygen Prescription None       Home Resting Oxygen Prescription None       Compliance with Home Oxygen Use No  Does not use CPAP as prescribed         Goals/Expected Outcomes   Short Term Goals To learn and exhibit compliance with exercise, home and travel O2 prescription;To learn and understand importance of monitoring SPO2 with pulse oximeter and demonstrate accurate use of the pulse oximeter.;To learn and understand  importance of maintaining oxygen saturations>88%;To learn and demonstrate proper pursed lip breathing techniques or other breathing techniques.;To learn and demonstrate proper use of respiratory medications       Long  Term Goals Exhibits compliance with exercise, home and travel O2 prescription;Verbalizes importance of monitoring SPO2 with pulse oximeter and return demonstration;Maintenance of O2 saturations>88%;Exhibits proper breathing techniques, such as pursed lip breathing or other method taught during program session;Compliance with respiratory medication;Demonstrates proper use of MDI's       Goals/Expected Outcomes compliance and understanding of oxygen saturation and pursed lip breathing              Oxygen Discharge (Final Oxygen Re-Evaluation):  Oxygen Re-Evaluation - 01/15/20 1625      Program Oxygen Prescription   Program Oxygen Prescription  None      Home Oxygen   Home Oxygen Device None    Sleep Oxygen Prescription CPAP    Liters per minute --   does not use   Home Exercise Oxygen Prescription None    Home Resting Oxygen Prescription None    Compliance with Home Oxygen Use No   Does not use CPAP as prescribed     Goals/Expected Outcomes   Short Term Goals To learn and exhibit compliance with exercise, home and travel O2 prescription;To learn and understand importance of monitoring SPO2 with pulse oximeter and demonstrate accurate use of the pulse oximeter.;To learn and understand importance of maintaining oxygen saturations>88%;To learn and demonstrate proper pursed lip breathing techniques or other breathing techniques.;To learn and demonstrate proper use of respiratory medications    Long  Term Goals Exhibits compliance with exercise, home and travel O2 prescription;Verbalizes importance of monitoring SPO2 with pulse oximeter and return demonstration;Maintenance of O2 saturations>88%;Exhibits proper breathing techniques, such as pursed lip breathing or other method  taught during program session;Compliance with respiratory medication;Demonstrates proper use of MDI's    Goals/Expected Outcomes compliance and understanding of oxygen saturation and pursed lip breathing           Initial Exercise Prescription:  Initial Exercise Prescription - 01/11/20 1400      Date of Initial Exercise RX and Referring Provider   Date 01/10/20    Referring Provider Dr. Mayford Knife ([place])    Expected Discharge Date 03/20/20      NuStep   Level 2    SPM 80    Minutes 15      Arm Ergometer   Level 1    RPM 60    Minutes 15      Prescription Details   Frequency (times per week) 2    Duration Progress to 30 minutes of continuous aerobic without signs/symptoms of physical distress      Intensity   THRR 40-80% of Max Heartrate 60-121    Ratings of Perceived Exertion 11-13    Perceived Dyspnea 0-4      Progression   Progression Continue to progress workloads to maintain intensity without signs/symptoms of physical distress.      Resistance Training   Training Prescription Yes    Weight blue bands    Reps 10-15           Perform Capillary Blood Glucose checks as needed.  Exercise Prescription Changes:  Exercise Prescription Changes    Row Name 01/15/20 1600             Response to Exercise   Blood Pressure (Admit) 140/52       Blood Pressure (Exercise) 144/68       Blood Pressure (Exit) 122/56       Heart Rate (Admit) 60 bpm       Heart Rate (Exercise) 70 bpm       Heart Rate (Exit) 59 bpm       Oxygen Saturation (Admit) 98 %       Oxygen Saturation (Exercise) 97 %       Oxygen Saturation (Exit) 98 %       Rating of Perceived Exertion (Exercise) 11       Perceived Dyspnea (Exercise) 1       Duration Continue with 30 min of aerobic exercise without signs/symptoms of physical distress.       Intensity THRR unchanged         Progression   Progression Continue to progress  workloads to maintain intensity without signs/symptoms of physical distress.        Average METs 1.4         Resistance Training   Training Prescription Yes       Weight blue bands       Reps 10-15         NuStep   Level 2       SPM 80       Minutes 15         Arm Ergometer   Level 1       RPM 60       Minutes 15              Exercise Comments:  Exercise Comments    Row Name 01/15/20 1624           Exercise Comments Pt completed first day of exercise with no complaints or concerns              Exercise Goals and Review:  Exercise Goals    Row Name 01/10/20 1216             Exercise Goals   Increase Physical Activity Yes       Intervention Provide advice, education, support and counseling about physical activity/exercise needs.;Develop an individualized exercise prescription for aerobic and resistive training based on initial evaluation findings, risk stratification, comorbidities and participant's personal goals.       Expected Outcomes Short Term: Attend rehab on a regular basis to increase amount of physical activity.;Long Term: Add in home exercise to make exercise part of routine and to increase amount of physical activity.;Long Term: Exercising regularly at least 3-5 days a week.       Increase Strength and Stamina Yes       Intervention Provide advice, education, support and counseling about physical activity/exercise needs.;Develop an individualized exercise prescription for aerobic and resistive training based on initial evaluation findings, risk stratification, comorbidities and participant's personal goals.       Expected Outcomes Short Term: Increase workloads from initial exercise prescription for resistance, speed, and METs.;Short Term: Perform resistance training exercises routinely during rehab and add in resistance training at home;Long Term: Improve cardiorespiratory fitness, muscular endurance and strength as measured by increased METs and functional capacity ( )       Able to understand and use rate of perceived exertion  (RPE) scale Yes       Intervention Provide education and explanation on how to use RPE scale       Expected Outcomes Short Term: Able to use RPE daily in rehab to express subjective intensity level;Long Term:  Able to use RPE to guide intensity level when exercising independently       Able to understand and use Dyspnea scale Yes       Intervention Provide education and explanation on how to use Dyspnea scale       Expected Outcomes Short Term: Able to use Dyspnea scale daily in rehab to express subjective sense of shortness of breath during exertion;Long Term: Able to use Dyspnea scale to guide intensity level when exercising independently       Knowledge and understanding of Target Heart Rate Range (THRR) Yes       Intervention Provide education and explanation of THRR including how the numbers were predicted and where they are located for reference       Expected Outcomes Short Term: Able to state/look up THRR;Long Term: Able to use THRR to  govern intensity when exercising independently;Short Term: Able to use daily as guideline for intensity in rehab       Understanding of Exercise Prescription Yes       Intervention Provide education, explanation, and written materials on patient's individual exercise prescription       Expected Outcomes Short Term: Able to explain program exercise prescription;Long Term: Able to explain home exercise prescription to exercise independently              Exercise Goals Re-Evaluation :  Exercise Goals Re-Evaluation    Row Name 01/15/20 1619             Exercise Goal Re-Evaluation   Exercise Goals Review Increase Physical Activity;Increase Strength and Stamina;Able to understand and use rate of perceived exertion (RPE) scale;Able to understand and use Dyspnea scale;Knowledge and understanding of Target Heart Rate Range (THRR);Understanding of Exercise Prescription       Comments Pt has completed 1 exercise session with rehab and tolerated it well with no  complaints or concerns. He exercised at 1.4 METS on the Nustep and 1.5 METS on the arm ergometer. We will continue to monitor and progress as he is able       Expected Outcomes Through exercise at rehab and home the patient will decrease shortness of breath with daily activities and feel confident in carrying out an exercise regimn at home              Discharge Exercise Prescription (Final Exercise Prescription Changes):  Exercise Prescription Changes - 01/15/20 1600      Response to Exercise   Blood Pressure (Admit) 140/52    Blood Pressure (Exercise) 144/68    Blood Pressure (Exit) 122/56    Heart Rate (Admit) 60 bpm    Heart Rate (Exercise) 70 bpm    Heart Rate (Exit) 59 bpm    Oxygen Saturation (Admit) 98 %    Oxygen Saturation (Exercise) 97 %    Oxygen Saturation (Exit) 98 %    Rating of Perceived Exertion (Exercise) 11    Perceived Dyspnea (Exercise) 1    Duration Continue with 30 min of aerobic exercise without signs/symptoms of physical distress.    Intensity THRR unchanged      Progression   Progression Continue to progress workloads to maintain intensity without signs/symptoms of physical distress.    Average METs 1.4      Resistance Training   Training Prescription Yes    Weight blue bands    Reps 10-15      NuStep   Level 2    SPM 80    Minutes 15      Arm Ergometer   Level 1    RPM 60    Minutes 15           Nutrition:  Target Goals: Understanding of nutrition guidelines, daily intake of sodium 1500mg , cholesterol 200mg , calories 30% from fat and 7% or less from saturated fats, daily to have 5 or more servings of fruits and vegetables.  Biometrics:  Pre Biometrics - 01/10/20 1615      Pre Biometrics   Grip Strength 33 kg            Nutrition Therapy Plan and Nutrition Goals:   Nutrition Assessments:   Nutrition Goals Re-Evaluation:   Nutrition Goals Discharge (Final Nutrition Goals Re-Evaluation):   Psychosocial: Target Goals:  Acknowledge presence or absence of significant depression and/or stress, maximize coping skills, provide positive support system. Participant is able to verbalize  types and ability to use techniques and skills needed for reducing stress and depression.  Initial Review & Psychosocial Screening:  Initial Psych Review & Screening - 01/10/20 1056      Initial Review   Current issues with Current Stress Concerns    Source of Stress Concerns Unable to participate in former interests or hobbies;Unable to perform yard/household activities    Comments COVID is a big stressor for him. Unable to do things because of illness      Family Dynamics   Good Support System? Yes   His friends and roomate is his support system     Barriers   Psychosocial barriers to participate in program The patient should benefit from training in stress management and relaxation.      Screening Interventions   Interventions Encouraged to exercise    Expected Outcomes Short Term goal: Utilizing psychosocial counselor, staff and physician to assist with identification of specific Stressors or current issues interfering with healing process. Setting desired goal for each stressor or current issue identified.;Long Term Goal: Stressors or current issues are controlled or eliminated.;Short Term goal: Identification and review with participant of any Quality of Life or Depression concerns found by scoring the questionnaire.;Long Term goal: The participant improves quality of Life and PHQ9 Scores as seen by post scores and/or verbalization of changes           Quality of Life Scores:  Scores of 19 and below usually indicate a poorer quality of life in these areas.  A difference of  2-3 points is a clinically meaningful difference.  A difference of 2-3 points in the total score of the Quality of Life Index has been associated with significant improvement in overall quality of life, self-image, physical symptoms, and general health in  studies assessing change in quality of life.  PHQ-9: Recent Review Flowsheet Data    Depression screen [facility] 2/9 01/10/2020   Decreased Interest 2   Down, Depressed, Hopeless 1   PHQ - 2 Score 3   Altered sleeping 1   Tired, decreased energy 1   Change in appetite 2   Feeling bad or failure about yourself  1   Trouble concentrating 0   Moving slowly or fidgety/restless 0   Suicidal thoughts 0   PHQ-9 Score 8   Difficult doing work/chores Somewhat difficult     Interpretation of Total Score  Total Score Depression Severity:  1-4 = Minimal depression, 5-9 = Mild depression, 10-14 = Moderate depression, 15-19 = Moderately severe depression, 20-27 = Severe depression   Psychosocial Evaluation and Intervention:  Psychosocial Evaluation - 01/15/20 1446      Psychosocial Evaluation & Interventions   Expected Outcomes Ah will remain demonstrate positive and healthy coping skills to manage his stress.    Continue Psychosocial Services  Follow up required by staff           Psychosocial Re-Evaluation:  Psychosocial Re-Evaluation    Row Name 01/15/20 1447             Psychosocial Re-Evaluation   Current issues with Current Stress Concerns       Comments Pt has completed 1 exercise sessions.  Will continue to engage rapport with pt and develop a relationship of trust.       Interventions Stress management education;Encouraged to attend Pulmonary Rehabilitation for the exercise;Physician referral       Continue Psychosocial Services  Follow up required by staff  Psychosocial Discharge (Final Psychosocial Re-Evaluation):  Psychosocial Re-Evaluation - 01/15/20 1447      Psychosocial Re-Evaluation   Current issues with Current Stress Concerns    Comments Pt has completed 1 exercise sessions.  Will continue to engage rapport with pt and develop a relationship of trust.    Interventions Stress management education;Encouraged to attend Pulmonary Rehabilitation for  the exercise;Physician referral    Continue Psychosocial Services  Follow up required by staff           Education: Education Goals: Education classes will be provided on a weekly basis, covering required topics. Participant will state understanding/return demonstration of topics presented.  Learning Barriers/Preferences:  Learning Barriers/Preferences - 01/10/20 1102      Learning Barriers/Preferences   Learning Barriers Hearing   Right ear hearing problems   Learning Preferences Group Instruction;Skilled Demonstration;Written Material;Video           Education Topics: Risk Factor Reduction:  -Group instruction that is supported by a PowerPoint presentation. Instructor discusses the definition of a risk factor, different risk factors for pulmonary disease, and how the heart and lungs work together.     Nutrition for Pulmonary Patient:  -Group instruction provided by PowerPoint slides, verbal discussion, and written materials to support subject matter. The instructor gives an explanation and review of healthy diet recommendations, which includes a discussion on weight management, recommendations for fruit and vegetable consumption, as well as protein, fluid, caffeine, fiber, sodium, sugar, and alcohol. Tips for eating when patients are short of breath are discussed.   Pursed Lip Breathing:  -Group instruction that is supported by demonstration and informational handouts. Instructor discusses the benefits of pursed lip and diaphragmatic breathing and detailed demonstration on how to preform both.     Oxygen Safety:  -Group instruction provided by PowerPoint, verbal discussion, and written material to support subject matter. There is an overview of "What is Oxygen" and "Why do we need it".  Instructor also reviews how to create a safe environment for oxygen use, the importance of using oxygen as prescribed, and the risks of noncompliance. There is a brief discussion on traveling with  oxygen and resources the patient may utilize.   Oxygen Equipment:  -Group instruction provided by [facility] Staff utilizing handouts, written materials, and equipment demonstrations.   Signs and Symptoms:  -Group instruction provided by written material and verbal discussion to support subject matter. Warning signs and symptoms of infection, stroke, and heart attack are reviewed and when to call the physician/911 reinforced. Tips for preventing the spread of infection discussed.   Advanced Directives:  -Group instruction provided by verbal instruction and written material to support subject matter. Instructor reviews Advanced Directive laws and proper instruction for filling out document.   Pulmonary Video:  -Group video education that reviews the importance of medication and oxygen compliance, exercise, good nutrition, pulmonary hygiene, and pursed lip and diaphragmatic breathing for the pulmonary patient.   Exercise for the Pulmonary Patient:  -Group instruction that is supported by a PowerPoint presentation. Instructor discusses benefits of exercise, core components of exercise, frequency, duration, and intensity of an exercise routine, importance of utilizing pulse oximetry during exercise, safety while exercising, and options of places to exercise outside of rehab.     Pulmonary Medications:  -Verbally interactive group education provided by instructor with focus on inhaled medications and proper administration.   Anatomy and Physiology of the Respiratory System and Intimacy:  -Group instruction provided by PowerPoint, verbal discussion, and written material to support subject  matter. Instructor reviews respiratory cycle and anatomical components of the respiratory system and their functions. Instructor also reviews differences in obstructive and restrictive respiratory diseases with examples of each. Intimacy, Sex, and Sexuality differences are reviewed with a discussion on how  relationships can change when diagnosed with pulmonary disease. Common sexual concerns are reviewed.   MD DAY -A group question and answer session with a medical doctor that allows participants to ask questions that relate to their pulmonary disease state.   OTHER EDUCATION -Group or individual verbal, written, or video instructions that support the educational goals of the pulmonary rehab program.   Holiday Eating Survival Tips:  -Group instruction provided by PowerPoint slides, verbal discussion, and written materials to support subject matter. The instructor gives patients tips, tricks, and techniques to help them not only survive but enjoy the holidays despite the onslaught of food that accompanies the holidays.   Knowledge Questionnaire Score:  Knowledge Questionnaire Score - 01/10/20 1621      Knowledge Questionnaire Score   Pre Score 11/18           Core Components/Risk Factors/Patient Goals at Admission:   Core Components/Risk Factors/Patient Goals Review:   Goals and Risk Factor Review    Row Name 01/15/20 1449             Core Components/Risk Factors/Patient Goals Review   Personal Goals Review Develop more efficient breathing techniques such as purse lipped breathing and diaphragmatic breathing and practicing self-pacing with activity.;Increase knowledge of respiratory medications and ability to use respiratory devices properly.;Improve shortness of breath with ADL's       Review Asaph has completed 1 exercise session.  Will continue to support pt with instruction on breathing techniques to improve shortness of breath.  Pt observed ambulating independently - reminder to use cane for stability       Expected Outcomes See Admission Goals              Core Components/Risk Factors/Patient Goals at Discharge (Final Review):   Goals and Risk Factor Review - 01/15/20 1449      Core Components/Risk Factors/Patient Goals Review   Personal Goals Review Develop more  efficient breathing techniques such as purse lipped breathing and diaphragmatic breathing and practicing self-pacing with activity.;Increase knowledge of respiratory medications and ability to use respiratory devices properly.;Improve shortness of breath with ADL's    Review Draycen has completed 1 exercise session.  Will continue to support pt with instruction on breathing techniques to improve shortness of breath.  Pt observed ambulating independently - reminder to use cane for stability    Expected Outcomes See Admission Goals           ITP Comments:   Comments:  Terrel has completed 1 exercise session in Pulmonary rehab.  Pulmonary rehab staff will  continue to monitor and reassess progress toward goals during his  participation in Pulmonary Rehab. Alanson Aly, BSN Cardiac and Emergency planning/management officer

## 2020-01-17 NOTE — Progress Notes (Signed)
Daily Session Note  Patient Details  Name: Adam Ponce MRN: [id number] Date of Birth: Oct 17, 1950 Referring Provider:     Pulmonary Rehab Walk Test from 01/10/2020 in [place]  Referring Provider Dr. Radford Pax ([name])      Encounter Date: 01/17/2020  Check In:  Session Check In - 01/17/20 1350      Check-In   Supervising physician immediately available to respond to emergencies [facility] immediately available    Physician(s) Dr. Doristine Bosworth    Location [facility]    Staff Present Maurice Small, RN, Bjorn Loser, MS, CEP, Exercise Physiologist;Jessica Hassell Done, MS, ACSM-CEP, Exercise Physiologist    Virtual Visit No    Medication changes reported     No    Fall or balance concerns reported    No    Tobacco Cessation No Change    Warm-up and Cool-down Performed on first and last piece of equipment    Resistance Training Performed Yes    VAD Patient? No    PAD/SET Patient? No      Pain Assessment   Currently in Pain? No/denies    Pain Score 0-No pain    Multiple Pain Sites No           Capillary Blood Glucose: No results found for this or any previous visit (from the past 24 hour(s)).    Social History   Tobacco Use  Smoking Status Former Smoker  . Packs/day: 1.00  . Types: Cigarettes  Smokeless Tobacco Never Used    Goals Met:  Independence with exercise equipment Exercise tolerated well No report of cardiac concerns or symptoms Strength training completed today  Goals Unmet:  Not Applicable  Comments: Service time is from 1300 to 1422    Dr. Fransico Him is Medical Director for Cardiac Rehab at [facility].

## 2020-01-22 ENCOUNTER — Encounter (HOSPITAL_COMMUNITY)
Admission: RE | Admit: 2020-01-22 | Discharge: 2020-01-22 | Disposition: A | Discharge status: Home / Self Care | Payer: No Typology Code available for payment source | Source: Ambulatory Visit | Attending: Cardiology | Admitting: Cardiology

## 2020-01-22 ENCOUNTER — Other Ambulatory Visit: Payer: Self-pay

## 2020-01-22 DIAGNOSIS — R0609 Other forms of dyspnea: Secondary | ICD-10-CM

## 2020-01-22 DIAGNOSIS — R0600 Dyspnea, unspecified: Secondary | ICD-10-CM | POA: Diagnosis present

## 2020-01-22 NOTE — Progress Notes (Signed)
Daily Session Note  Patient Details  Name: Adam Ponce MRN: [id number] Date of Birth: 03/10/1951 Referring Provider:     Pulmonary Rehab Walk Test from 01/10/2020 in [place]  Referring Provider Dr. Radford Pax ([place])      Encounter Date: 01/22/2020  Check In:  Session Check In - 01/22/20 1338      Check-In   Supervising physician immediately available to respond to emergencies [facility] immediately available    Physician(s) Dr. Doristine Bosworth    [facility]    Staff Present Maurice Small, RN, Bjorn Loser, MS, CEP, Exercise Physiologist;Lisa Jani [place]vel, MS, ACSM-CEP, Exercise Physiologist    Virtual Visit No    Medication changes reported     No    Fall or balance concerns reported    No    Tobacco Cessation No Change    Warm-up and Cool-down Performed on first and last piece of equipment    Resistance Training Performed Yes    VAD Patient? No    PAD/SET Patient? No      Pain Assessment   Currently in Pain? No/denies    Pain Score 0-No pain    Multiple Pain Sites No           Capillary Blood Glucose: No results found for this or any previous visit (from the past 24 hour(s)).    Social History   Tobacco Use  Smoking Status Former Smoker  . Packs/day: 1.00  . Types: Cigarettes  Smokeless Tobacco Never Used    Goals Met:  Proper associated with RPD/PD & O2 Sat Exercise tolerated well No report of cardiac concerns or symptoms Strength training completed today  Goals Unmet:  Not Applicable  Comments: Service time is from 1315 to 89    Dr. Fransico Him is Medical Director for Cardiac Rehab at [facility].

## 2020-01-24 ENCOUNTER — Other Ambulatory Visit: Payer: Self-pay

## 2020-01-24 ENCOUNTER — Encounter (HOSPITAL_COMMUNITY)
Admission: RE | Admit: 2020-01-24 | Discharge: 2020-01-24 | Disposition: A | Discharge status: Home / Self Care | Payer: No Typology Code available for payment source | Source: Ambulatory Visit | Attending: Cardiology | Admitting: Cardiology

## 2020-01-24 ENCOUNTER — Other Ambulatory Visit: Payer: No Typology Code available for payment source

## 2020-01-24 DIAGNOSIS — Z20822 Contact with and (suspected) exposure to covid-19: Secondary | ICD-10-CM

## 2020-01-24 DIAGNOSIS — R0600 Dyspnea, unspecified: Secondary | ICD-10-CM | POA: Diagnosis not present

## 2020-01-24 DIAGNOSIS — R0609 Other forms of dyspnea: Secondary | ICD-10-CM

## 2020-01-24 NOTE — Progress Notes (Signed)
Daily Session Note  Patient Details  Name: KINSLEY NICKLAUS MRN: [id number] Date of Birth: [birth year] Referring Provider:     Pulmonary Rehab Walk Test from 01/10/2020 in [place]  Referring Provider Dr. Radford Pax ([place])      Encounter Date: 01/24/2020  Check In:  Session Check In - 01/24/20 1413      Check-In   Supervising physician immediately available to respond to emergencies [facility] immediately available    Physician(s) Dr. Rodena Piety    [facility]    Staff Present Maurice Small, RN, BSN;Areal Cochrane Ysidro Evert, RN;Jessica Hassell Done, MS, ACSM-CEP, Exercise Physiologist    Virtual Visit No    Medication changes reported     No    Fall or balance concerns reported    No    Tobacco Cessation No Change    Warm-up and Cool-down Performed on first and last piece of equipment    Resistance Training Performed Yes    VAD Patient? No    PAD/SET Patient? No      Pain Assessment   Currently in Pain? No/denies    Pain Score 0-No pain    Multiple Pain Sites No           Capillary Blood Glucose: No results found for this or any previous visit (from the past 24 hour(s)).    Social History   Tobacco Use  Smoking Status Former Smoker  . Packs/day: 1.00  . Types: Cigarettes  Smokeless Tobacco Never Used    Goals Met:  Exercise tolerated well No report of cardiac concerns or symptoms Strength training completed today  Goals Unmet:  Not Applicable  Comments: Service time is from 1305 to 8    Dr. Fransico Him is Medical Director for Cardiac Rehab at [facility].

## 2020-01-25 LAB — NOVEL CORONAVIRUS, NAA: SARS-CoV-2, NAA: NOT DETECTED

## 2020-01-25 LAB — SARS-COV-2, NAA 2 DAY TAT

## 2020-01-29 ENCOUNTER — Other Ambulatory Visit: Payer: Self-pay

## 2020-01-29 ENCOUNTER — Encounter (HOSPITAL_COMMUNITY)
Admission: RE | Admit: 2020-01-29 | Discharge: 2020-01-29 | Disposition: A | Discharge status: Home / Self Care | Payer: No Typology Code available for payment source | Source: Ambulatory Visit | Attending: Cardiology | Admitting: Cardiology

## 2020-01-29 VITALS — Wt 285.1 lb

## 2020-01-29 DIAGNOSIS — R0609 Other forms of dyspnea: Secondary | ICD-10-CM

## 2020-01-29 DIAGNOSIS — R0600 Dyspnea, unspecified: Secondary | ICD-10-CM | POA: Diagnosis not present

## 2020-01-29 NOTE — Progress Notes (Signed)
Daily Session Note  Patient Details  Name: ADIEN KIMMEL MRN: [id number] Date of Birth: Apr 29, 1950 Referring Provider:     Pulmonary Rehab Walk Test from 01/10/2020 in [place]  Referring Provider Dr. Radford Pax ([place])      Encounter Date: 01/29/2020  Check In:  Session Check In - 01/29/20 1347      Check-In   Supervising physician immediately available to respond to emergencies [facility] immediately available    Physician(s) Dr. Rodena Piety    [facility]    Staff Present Maurice Small, RN, BSN;Lisa Ysidro Evert, RN;David [place], MS, EP-C, CCRP;Xayne Brumbaugh Hassell Done, MS, ACSM-CEP, Exercise Physiologist    Virtual Visit No    Medication changes reported     No    Fall or balance concerns reported    No    Tobacco Cessation No Change    Warm-up and Cool-down Performed on first and last piece of equipment    Resistance Training Performed Yes    VAD Patient? No    PAD/SET Patient? No      Pain Assessment   Currently in Pain? No/denies    Pain Score 0-No pain    Multiple Pain Sites No           Capillary Blood Glucose: No results found for this or any previous visit (from the past 24 hour(s)).  POCT Glucose - 01/29/20 1447      POCT Blood Glucose   Pre-Exercise 167 mg/dL    Post-Exercise 104 mg/dL    Pre-Exercise #2 252 mg/dL    Post-Exercise #2 171 mg/dL    Pre-Exercise #3 193 mg/dL    Post-Exercise #3 122 mg/dL    Random 204 mg/dL           Exercise Prescription Changes - 01/29/20 1400      Response to Exercise   Blood Pressure (Admit) 118/56    Blood Pressure (Exercise) 120/50    Blood Pressure (Exit) 110/54    Heart Rate (Admit) 58 bpm    Heart Rate (Exercise) 74 bpm    Heart Rate (Exit) 58 bpm    Oxygen Saturation (Admit) 97 %    Oxygen Saturation (Exercise) 97 %    Oxygen Saturation (Exit) 97 %    Rating of Perceived Exertion (Exercise) 13    Perceived Dyspnea (Exercise) 0    Duration Continue with  30 min of aerobic exercise without signs/symptoms of physical distress.    Intensity THRR unchanged      Progression   Progression Continue to progress workloads to maintain intensity without signs/symptoms of physical distress.    Average METs 1.9      Resistance Training   Training Prescription Yes    Weight blue bands    Reps 10-15    Time 10 Minutes      NuStep   Level 3    SPM 80    Minutes 15      Arm Ergometer   Level 2    RPM 60    Minutes 15           Social History   Tobacco Use  Smoking Status Former Smoker  . Packs/day: 1.00  . Types: Cigarettes  Smokeless Tobacco Never Used    Goals Met:  Proper associated with RPD/PD & O2 Sat Exercise tolerated well No report of cardiac concerns or symptoms Strength training completed today  Goals Unmet:  Not Applicable  Comments: Service time is from 1305 to 1426  Dr. Traci Turner is Medical Director for Cardiac Rehab at [facility]. 

## 2020-01-31 ENCOUNTER — Encounter (HOSPITAL_COMMUNITY)
Admission: RE | Admit: 2020-01-31 | Discharge: 2020-01-31 | Disposition: A | Discharge status: Home / Self Care | Payer: No Typology Code available for payment source | Source: Ambulatory Visit | Attending: Cardiology | Admitting: Cardiology

## 2020-01-31 ENCOUNTER — Other Ambulatory Visit: Payer: Self-pay

## 2020-01-31 DIAGNOSIS — R0609 Other forms of dyspnea: Secondary | ICD-10-CM

## 2020-01-31 DIAGNOSIS — R0600 Dyspnea, unspecified: Secondary | ICD-10-CM | POA: Diagnosis not present

## 2020-01-31 NOTE — Progress Notes (Signed)
Daily Session Note ° °Patient Details  °Name: Adam Ponce °MRN: [id number] °Date of Birth: 01/06/1951 °Referring Provider:   °  Pulmonary Rehab Walk Test from 01/10/2020 in [facility]  °Ref[place] Provider Dr. Turner ([place])  °  ° ° °Encounter Date: 01/31/2020 ° °Check In: ° Session Check In - 01/31/20 1416   °  ° Check-In  ° Supervising physician immediately available to respond to emergencies [facility] immediately available   ° Physician(s) Dr. Nettey   ° [facility]   ° Staff Present Carlette Carlton, RN, BSN;Lisa Hughes, RN;David Makemson, MS, EP-C, CCRP;Jessica Martin, MS, ACSM-CEP, Exercise Physiologist   ° Virtual Visit No   ° Medication changes reported     No   ° Fall or balance concerns reported    No   ° Tobacco Cessation No Change   ° Warm-up and Cool-down Performed on first and last piece of equipment   ° Resistance Training Performed Yes   ° VAD Patient? No   ° PAD/SET Patient? No   °  ° Pain Assessment  ° Currently in Pain? No/denies   ° Pain Score 0-No pain   ° Multiple Pain Sites No   °  °  °  ° ° °Capillary Blood Glucose: °No results found for this or any previous visit (from the past 24 hour(s)). ° ° ° °Social History  ° °Tobacco Use  °Smoking Status Former Smoker  °• Packs/day: 1.00  °• Types: Cigarettes  °Smokeless Tobacco Never Used  ° ° °Goals Met:  °Proper associated with RPD/PD & O2 Sat °Exercise tolerated well °No report of cardiac concerns or symptoms °Strength training completed today ° °Goals Unmet:  °Not Applicable ° °Comments: Service time is from 1325 to 1435 ° ° ° °Dr. Traci Turner is Medical Director for Cardiac Rehab at [facility]. °

## 2020-01-31 NOTE — Progress Notes (Signed)
Nutrition Note  Addressed pt's weight loss concerns. He is frustrated by lack of weight loss. He also mentions struggling with blood sugars.  He states snacking often due to hunger. Provided him with ideas of snacks that may keep him full while also helping blood sugars. Ie fruit and greek yogurt, banana and peanut butter. He is amenable to these snacks and has some of them at home. He has been drinking juice and chocolate milk as he feels it keeps his bowels moving. Encouraged pt to choose more water and to try eating adequate fruits and soft veggies. He is amenable. Pt does a great job of keeping fruits in his house that he can eat so we set goals for combining these with protein rich foods.  He also states wanting to walk after meals to lower CBGs but is having a lot of knee pain which prevents walking. He realizes he has a peddle machine that he can use and feels this will be better for his pain.   Pt led goal setting. Provided time and feedback for pt questions.  Will continue to monitor pt during pulmonary rehab.   Andrey Campanile, MS, RDN, LDN

## 2020-01-31 NOTE — Progress Notes (Deleted)
Nutrition Note °  °Addressed pt's weight loss concerns. He is frustrated by lack of weight loss. He also mentions struggling with blood sugars.  °He states snacking often due to hunger. Provided him with ideas of snacks that may keep him full while also helping blood sugars. Ie fruit and greek yogurt, banana and peanut butter. He is amenable to these snacks and has some of them at home. He has been drinking juice and chocolate milk as he feels it keeps his bowels moving. Encouraged pt to choose more water and to try eating adequate fruits and soft veggies. He is amenable. °Pt does a great job of keeping fruits in his house that he can eat so we set goals for combining these with protein rich foods.  °He also states wanting to walk after meals to lower CBGs but is having a lot of knee pain which prevents walking. He realizes he has a peddle machine that he can use and feels this will be better for his pain.  °  °Pt led goal setting. Provided time and feedback for pt questions.  °Will continue to monitor pt during pulmonary rehab.  °  °Pinchus Weckwerth, MS, RDN, LDN  °

## 2020-02-05 ENCOUNTER — Encounter (HOSPITAL_COMMUNITY)
Admission: RE | Admit: 2020-02-05 | Discharge: 2020-02-05 | Disposition: A | Discharge status: Home / Self Care | Payer: No Typology Code available for payment source | Source: Ambulatory Visit | Attending: Cardiology | Admitting: Cardiology

## 2020-02-05 ENCOUNTER — Other Ambulatory Visit: Payer: Self-pay

## 2020-02-05 DIAGNOSIS — R0600 Dyspnea, unspecified: Secondary | ICD-10-CM | POA: Diagnosis not present

## 2020-02-05 DIAGNOSIS — R0609 Other forms of dyspnea: Secondary | ICD-10-CM

## 2020-02-05 NOTE — Progress Notes (Signed)
Daily Session Note  Patient Details  Name: Adam Ponce MRN: [id number] Date of Birth: 09/08/1950 Referring Provider:     Pulmonary Rehab Walk Test from 01/10/2020 in [facility]  Ref[place] Provider Dr. Turner ([place])      Encounter Date: 02/05/2020  Check In:  Session Check In - 02/05/20 1502      Check-In   Supervising physician immediately available to respond to emergencies [facility] immediately available    Physician(s) Dr. Nettey    [facility]    Staff Present Carlette Carlton, RN, BSN;Lisa Hughes, RN;Jessica Martin, MS, ACSM-CEP, Exercise Physiologist    Virtual Visit No    Medication changes reported     No    Fall or balance concerns reported    No    Tobacco Cessation No Change    Warm-up and Cool-down Performed on first and last piece of equipment    Resistance Training Performed Yes    VAD Patient? No    PAD/SET Patient? No      Pain Assessment   Currently in Pain? No/denies    Multiple Pain Sites No           Capillary Blood Glucose: No results found for this or any previous visit (from the past 24 hour(s)).    Social History   Tobacco Use  Smoking Status Former Smoker  . Packs/day: 1.00  . Types: Cigarettes  Smokeless Tobacco Never Used    Goals Met:  Exercise tolerated well No report of cardiac concerns or symptoms Strength training completed today  Goals Unmet:  Not Applicable  Comments: Service time is from 1310 to 1422    Dr. Traci Turner is Medical Director for Cardiac Rehab at [facility]. 

## 2020-02-07 ENCOUNTER — Other Ambulatory Visit: Payer: Self-pay

## 2020-02-07 ENCOUNTER — Encounter (HOSPITAL_COMMUNITY)
Admission: RE | Admit: 2020-02-07 | Discharge: 2020-02-07 | Disposition: A | Discharge status: Home / Self Care | Payer: No Typology Code available for payment source | Source: Ambulatory Visit | Attending: Cardiology | Admitting: Cardiology

## 2020-02-07 DIAGNOSIS — R0609 Other forms of dyspnea: Secondary | ICD-10-CM

## 2020-02-07 DIAGNOSIS — R0600 Dyspnea, unspecified: Secondary | ICD-10-CM | POA: Diagnosis not present

## 2020-02-07 NOTE — Progress Notes (Signed)
Daily Session Note  Patient Details  Name: Adam Ponce MRN: [id number] Date of Birth: February 24, 1951 Referring Provider:     Pulmonary Rehab Walk Test from 01/10/2020 in [place]  Referring Provider Dr. Radford Pax ([place])      Encounter Date: 02/07/2020  Check In:  Session Check In - 02/07/20 1348      Check-In   Supervising physician immediately available to respond to emergencies [facility] immediately available    Physician(s) Dr. Dessa Phi    [facility]    Staff Present Maurice Small, RN, BSN;Angelice Piech Ysidro Evert, RN;Jessica Hassell Done, MS, ACSM-CEP, Exercise Physiologist    Virtual Visit No    Medication changes reported     No    Fall or balance concerns reported    No    Tobacco Cessation No Change    Warm-up and Cool-down Performed on first and last piece of equipment    Resistance Training Performed Yes    VAD Patient? No    PAD/SET Patient? No      Pain Assessment   Currently in Pain? No/denies    Pain Score 0-No pain    Multiple Pain Sites No           Capillary Blood Glucose: No results found for this or any previous visit (from the past 24 hour(s)).    Social History   Tobacco Use  Smoking Status Former Smoker  . Packs/day: 1.00  . Types: Cigarettes  Smokeless Tobacco Never Used    Goals Met:  Exercise tolerated well No report of cardiac concerns or symptoms Strength training completed today  Goals Unmet:  Not Applicable  Comments: Service time is from 1300 to 1425    Dr. Fransico Him is Medical Director for Cardiac Rehab at [facility].

## 2020-02-12 ENCOUNTER — Other Ambulatory Visit: Payer: Self-pay

## 2020-02-12 ENCOUNTER — Encounter (HOSPITAL_COMMUNITY)
Admission: RE | Admit: 2020-02-12 | Discharge: 2020-02-12 | Disposition: A | Discharge status: Home / Self Care | Payer: No Typology Code available for payment source | Source: Ambulatory Visit | Attending: Cardiology | Admitting: Cardiology

## 2020-02-12 DIAGNOSIS — R0600 Dyspnea, unspecified: Secondary | ICD-10-CM | POA: Diagnosis not present

## 2020-02-12 DIAGNOSIS — R0609 Other forms of dyspnea: Secondary | ICD-10-CM

## 2020-02-12 NOTE — Progress Notes (Signed)
Daily Session Note  Patient Details  Name: Adam Ponce MRN: [id number] Date of Birth: 03-23-1951 Referring Provider:     Pulmonary Rehab Walk Test from 01/10/2020 in [place]  Referring Provider Dr. Radford Pax ([place])      Encounter Date: 02/12/2020  Check In:  Session Check In - 02/12/20 1332      Check-In   Supervising physician immediately available to respond to emergencies [facility] immediately available    Physician(s) Dr. Dessa Phi    [facility]    Staff Present Maurice Small, RN, BSN;Shakoya Gilmore Ysidro Evert, RN;Jessica Hassell Done, MS, ACSM-CEP, Exercise Physiologist    Virtual Visit No    Medication changes reported     No    Fall or balance concerns reported    No    Tobacco Cessation No Change    Warm-up and Cool-down Performed on first and last piece of equipment    Resistance Training Performed Yes    VAD Patient? No    PAD/SET Patient? No      Pain Assessment   Currently in Pain? No/denies    Pain Score 0-No pain    Multiple Pain Sites No           Capillary Blood Glucose: No results found for this or any previous visit (from the past 24 hour(s)).    Social History   Tobacco Use  Smoking Status Former Smoker  . Packs/day: 1.00  . Types: Cigarettes  Smokeless Tobacco Never Used    Goals Met:  Exercise tolerated well No report of cardiac concerns or symptoms Strength training completed today  Goals Unmet:  Not Applicable  Comments: Service time is from 1315 to 16    Dr. Fransico Him is Medical Director for Cardiac Rehab at [facility].

## 2020-02-14 ENCOUNTER — Encounter (HOSPITAL_COMMUNITY)
Admission: RE | Admit: 2020-02-14 | Discharge: 2020-02-14 | Disposition: A | Discharge status: Home / Self Care | Payer: No Typology Code available for payment source | Source: Ambulatory Visit | Attending: Cardiology | Admitting: Cardiology

## 2020-02-14 ENCOUNTER — Other Ambulatory Visit: Payer: Self-pay

## 2020-02-14 DIAGNOSIS — R0600 Dyspnea, unspecified: Secondary | ICD-10-CM | POA: Diagnosis not present

## 2020-02-14 DIAGNOSIS — R0609 Other forms of dyspnea: Secondary | ICD-10-CM

## 2020-02-14 NOTE — Progress Notes (Signed)
Daily Session Note  Patient Details  Name: Adam Ponce MRN: [id number] Date of Birth: April 09, 1951 Referring Provider:     Pulmonary Rehab Walk Test from 01/10/2020 in [place]  Referring Provider Dr. Radford Pax ([place])      Encounter Date: 02/14/2020  Check In:  Session Check In - 02/14/20 1319      Check-In   Supervising physician immediately available to respond to emergencies [facility] immediately available    Physician(s) Dr. Darrick Meigs    [facility]    Staff Present Rosebud Poles, RN, BSN;Lisa Ysidro Evert, RN;Matti Minney Hassell Done, MS, ACSM-CEP, Exercise Physiologist    Virtual Visit No    Medication changes reported     No    Fall or balance concerns reported    No    Tobacco Cessation No Change    Warm-up and Cool-down Performed on first and last piece of equipment    Resistance Training Performed Yes    VAD Patient? No    PAD/SET Patient? No      Pain Assessment   Currently in Pain? No/denies    Pain Score 0-No pain    Multiple Pain Sites No           Capillary Blood Glucose: No results found for this or any previous visit (from the past 24 hour(s)).    Social History   Tobacco Use  Smoking Status Former Smoker  . Packs/day: 1.00  . Types: Cigarettes  Smokeless Tobacco Never Used    Goals Met:  Proper associated with RPD/PD & O2 Sat Exercise tolerated well No report of cardiac concerns or symptoms Strength training completed today  Goals Unmet:  Not Applicable  Comments: Service time is from 1305 to 1415    Dr. Fransico Him is Medical Director for Cardiac Rehab at [facility]

## 2020-02-14 NOTE — Progress Notes (Signed)
Pulmonary Individual Treatment Plan  Patient Details  Name: Adam Ponce MRN: [id number] Date of Birth: [birth year] Referring Provider:     Pulmonary Rehab Walk Test from 01/10/2020 in [facility] CARDIAC [facility]  Referring Provider Dr. Mayford Knife ([place])      Initial Encounter Date:    Pulmonary Rehab Walk Test from 01/10/2020 in [facility] CARDI[facility] REHAB  Date 01/10/20      Visit Diagnosis: Dyspnea on exertion  Patient's Home Medications on Admission:   Current Outpatient Medications:  .  albuterol (PROVENTIL HFA;VENTOLIN HFA) 108 (90 Base) MCG/ACT inhaler, Inhale into the lungs every 6 (six) hours as needed for wheezing or shortness of breath., Disp: , Rfl:  .  betamethasone valerate (VALISONE) 0.1 % cream, Apply topically 2 (two) times daily., Disp: , Rfl:  .  cetirizine (ZYRTEC) 10 MG tablet, Take 10 mg by mouth daily., Disp: , Rfl:  .  Cholecalciferol (VITAMIN D3) 10000 units TABS, Take by mouth., Disp: , Rfl:  .  ciprofloxacin-dexamethasone (CIPRODEX) otic suspension, 4 drops 2 (two) times daily., Disp: , Rfl:  .  cyanocobalamin (,VITAMIN B-12,) 1000 MCG/ML injection, Inject 1,000 mcg into the muscle once., Disp: , Rfl:  .  darunavir (PREZISTA) 800 MG tablet, Take 800 mg by mouth., Disp: , Rfl:  .  docusate sodium (COLACE) 100 MG capsule, Take 100 mg by mouth 2 (two) times daily., Disp: , Rfl:  .  EMTRICITABINE-TENOFOVIR AF PO, Take by mouth., Disp: , Rfl:  .  fenofibrate (TRICOR) 145 MG tablet, Take 145 mg by mouth daily., Disp: , Rfl:  .  glipiZIDE (GLUCOTROL) 10 MG tablet, Take 10 mg by mouth daily before breakfast., Disp: , Rfl:  .  INSULIN GLARGINE [place], Inject into the skin., Disp: , Rfl:  .  lisinopril (PRINIVIL,ZESTRIL) 40 MG tablet, Take 40 mg by mouth daily., Disp: , Rfl:  .  magnesium citrate SOLN, Take 1 Bottle by mouth once., Disp: , Rfl:  .  metFORMIN (GLUCOPHAGE) 1000 MG tablet, Take 1,000 mg by mouth 2 (two) times daily with a meal., Disp:  , Rfl:  .  pravastatin (PRAVACHOL) 80 MG tablet, Take 80 mg by mouth daily., Disp: , Rfl:  .  pregabalin (LYRICA) 75 MG capsule, Take 75 mg by mouth 2 (two) times daily., Disp: , Rfl:  .  Psyllium 48.57 % POWD, Take by mouth., Disp: , Rfl:  .  ranitidine (ZANTAC) 150 MG capsule, Take 150 mg by mouth 2 (two) times daily., Disp: , Rfl:  .  ritonavir (NORVIR) 100 MG TABS tablet, Take by mouth., Disp: , Rfl:  .  sodium chloride (OCEAN) 0.65 % nasal spray, Place 1 spray into the nose as needed for congestion., Disp: , Rfl:  .  traMADol (ULTRAM) 50 MG tablet, Take by mouth every 6 (six) hours as needed., Disp: , Rfl:  .  warfarin (COUMADIN) 5 MG tablet, Take 5 mg by mouth daily., Disp: , Rfl:   Past Medical History: Past Medical History:  Diagnosis Date  . Arthritis   . Asthma   . Constipation   . Diabetes mellitus   . HIV disease (HCC) 1995  . Hypertension     Tobacco Use: Social History   Tobacco Use  Smoking Status Former Smoker  . Packs/day: 1.00  . Types: Cigarettes  Smokeless Tobacco Never Used    Labs: Recent Review Flowsheet Data   There is no flowsheet data to display.     Capillary Blood Glucose: Lab Results  Component Value Date   GLUCAP 100 (H) 08/23/2008   GLUCAP 118 (H) 08/23/2008   GLUCAP 116 (H) 08/22/2008   GLUCAP 101 (H) 08/22/2008   GLUCAP 103 (H) 08/22/2008    POCT Glucose    Row Name 01/29/20 1447             POCT Blood Glucose   Pre-Exercise 167 mg/dL       Post-Exercise 161 mg/dL       Pre-Exercise #2 096 mg/dL       Post-Exercise #2 171 mg/dL       Pre-Exercise #3 045 mg/dL       Post-Exercise #3 122 mg/dL       Random 409 mg/dL              Pulmonary Assessment Scores:  Pulmonary Assessment Scores    Row Name 01/10/20 1621 01/10/20 1642       ADL UCSD   ADL Phase Entry --    SOB Score total 61 --      CAT Score   CAT Score 8  entry --      mMRC Score   mMRC Score -- 4          UCSD: Self-administered rating of  dyspnea associated with activities of daily living (ADLs) 6-point scale (0 = "not at all" to 5 = "maximal or unable to do because of breathlessness")  Scoring Scores range from 0 to 120.  Minimally important difference is 5 units  CAT: CAT can identify the health impairment of COPD patients and is better correlated with disease progression.  CAT has a scoring range of zero to 40. The CAT score is classified into four groups of low (less than 10), medium (10 - 20), high (21-30) and very high (31-40) based on the impact level of disease on health status. A CAT score over 10 suggests significant symptoms.  A worsening CAT score could be explained by an exacerbation, poor medication adherence, poor inhaler technique, or progression of COPD or comorbid conditions.  CAT MCID is 2 points  mMRC: mMRC (Modified Medical Research Council) Dyspnea Scale is used to assess the degree of baseline functional disability in patients of respiratory disease due to dyspnea. No minimal important difference is established. A decrease in score of 1 point or greater is considered a positive change.   Pulmonary Function Assessment:   Exercise Target Goals: Exercise Program Goal: Individual exercise prescription set using results from initial 6 min walk test and THRR while considering  patient's activity barriers and safety.   Exercise Prescription Goal: Initial exercise prescription builds to 30-45 minutes a day of aerobic activity, 2-3 days per week.  Home exercise guidelines will be given to patient during program as part of exercise prescription that the participant will acknowledge.  Activity Barriers & Risk Stratification:  Activity Barriers & Cardiac Risk Stratification - 01/10/20 1053      Activity Barriers & Cardiac Risk Stratification   Activity Barriers Arthritis;Joint Problems;Right Hip Replacement;Left Hip Replacement;Deconditioning;Assistive Device    Cardiac Risk Stratification High           6  Minute Walk:  6 Minute Walk    Row Name 01/10/20 1217         6 Minute Walk   Phase Initial     Distance 942 feet     Walk Time 6 minutes     # of Rest Breaks 0     MPH 1.78     METS  2.03     RPE 13     Perceived Dyspnea  1     VO2 Peak 7.1     Symptoms No     Resting HR 60 bpm     Resting BP 126/62     Resting Oxygen Saturation  96 %     Exercise Oxygen Saturation  during 6 min walk 92 %     Max Ex. HR 126 bpm     Max Ex. BP 140/56     2 Minute Post BP 136/60       Interval HR   1 Minute HR 89     2 Minute HR 95     3 Minute HR 94     4 Minute HR 107     5 Minute HR 122     6 Minute HR 126     2 Minute Post HR 60     Interval Heart Rate? Yes       Interval Oxygen   Interval Oxygen? Yes     Baseline Oxygen Saturation % 96 %     1 Minute Oxygen Saturation % 94 %     1 Minute Liters of Oxygen 0 L     2 Minute Oxygen Saturation % 94 %     2 Minute Liters of Oxygen 0 L     3 Minute Oxygen Saturation % 92 %     3 Minute Liters of Oxygen 0 L     4 Minute Oxygen Saturation % 94 %     4 Minute Liters of Oxygen 0 L     5 Minute Oxygen Saturation % 94 %     5 Minute Liters of Oxygen 0 L     6 Minute Oxygen Saturation % 95 %     6 Minute Liters of Oxygen 0 L     2 Minute Post Oxygen Saturation % 97 %     2 Minute Post Liters of Oxygen 0 L            Oxygen Initial Assessment:  Oxygen Initial Assessment - 01/10/20 1217      Initial 6 min Walk   Oxygen Used None      Program Oxygen Prescription   Program Oxygen Prescription None           Oxygen Re-Evaluation:  Oxygen Re-Evaluation    Row Name 01/15/20 1625 02/14/20 0847           Program Oxygen Prescription   Program Oxygen Prescription None None        Home Oxygen   Home Oxygen Device None None      Sleep Oxygen Prescription CPAP CPAP      Liters per minute --  does not use --      Home Exercise Oxygen Prescription None None      Home Resting Oxygen Prescription None None      Compliance with  Home Oxygen Use No  Does not use CPAP as prescribed No      Comments -- --  Does not use CPAP        Goals/Expected Outcomes   Short Term Goals To learn and exhibit compliance with exercise, home and travel O2 prescription;To learn and understand importance of monitoring SPO2 with pulse oximeter and demonstrate accurate use of the pulse oximeter.;To learn and understand importance of maintaining oxygen saturations>88%;To learn and demonstrate proper pursed lip breathing techniques or other breathing techniques.;To learn and demonstrate proper use  of respiratory medications To learn and exhibit compliance with exercise, home and travel O2 prescription;To learn and understand importance of monitoring SPO2 with pulse oximeter and demonstrate accurate use of the pulse oximeter.;To learn and understand importance of maintaining oxygen saturations>88%;To learn and demonstrate proper pursed lip breathing techniques or other breathing techniques.;To learn and demonstrate proper use of respiratory medications      Long  Term Goals Exhibits compliance with exercise, home and travel O2 prescription;Verbalizes importance of monitoring SPO2 with pulse oximeter and return demonstration;Maintenance of O2 saturations>88%;Exhibits proper breathing techniques, such as pursed lip breathing or other method taught during program session;Compliance with respiratory medication;Demonstrates proper use of MDI's Exhibits compliance with exercise, home and travel O2 prescription;Verbalizes importance of monitoring SPO2 with pulse oximeter and return demonstration;Maintenance of O2 saturations>88%;Exhibits proper breathing techniques, such as pursed lip breathing or other method taught during program session;Compliance with respiratory medication;Demonstrates proper use of MDI's      Goals/Expected Outcomes compliance and understanding of oxygen saturation and pursed lip breathing compliance and understanding of oxygen saturation and  pursed lip breathing             Oxygen Discharge (Final Oxygen Re-Evaluation):  Oxygen Re-Evaluation - 02/14/20 0847      Program Oxygen Prescription   Program Oxygen Prescription None      Home Oxygen   Home Oxygen Device None    Sleep Oxygen Prescription CPAP    Home Exercise Oxygen Prescription None    Home Resting Oxygen Prescription None    Compliance with Home Oxygen Use No    Comments --   Does not use CPAP     Goals/Expected Outcomes   Short Term Goals To learn and exhibit compliance with exercise, home and travel O2 prescription;To learn and understand importance of monitoring SPO2 with pulse oximeter and demonstrate accurate use of the pulse oximeter.;To learn and understand importance of maintaining oxygen saturations>88%;To learn and demonstrate proper pursed lip breathing techniques or other breathing techniques.;To learn and demonstrate proper use of respiratory medications    Long  Term Goals Exhibits compliance with exercise, home and travel O2 prescription;Verbalizes importance of monitoring SPO2 with pulse oximeter and return demonstration;Maintenance of O2 saturations>88%;Exhibits proper breathing techniques, such as pursed lip breathing or other method taught during program session;Compliance with respiratory medication;Demonstrates proper use of MDI's    Goals/Expected Outcomes compliance and understanding of oxygen saturation and pursed lip breathing           Initial Exercise Prescription:  Initial Exercise Prescription - 01/11/20 1400      Date of Initial Exercise RX and Referring Provider   Date 01/10/20    Referring Provider Dr. Mayford Knife ([place])    Expected Discharge Date 03/20/20      NuStep   Level 2    SPM 80    Minutes 15      Arm Ergometer   Level 1    RPM 60    Minutes 15      Prescription Details   Frequency (times per week) 2    Duration Progress to 30 minutes of continuous aerobic without signs/symptoms of physical distress       Intensity   THRR 40-80% of Max Heartrate 60-121    Ratings of Perceived Exertion 11-13    Perceived Dyspnea 0-4      Progression   Progression Continue to progress workloads to maintain intensity without signs/symptoms of physical distress.      Resistance Training   Training Prescription Yes  Weight blue bands    Reps 10-15           Perform Capillary Blood Glucose checks as needed.  Exercise Prescription Changes:  Exercise Prescription Changes    Row Name 01/15/20 1600 01/29/20 1400 02/14/20 0800         Response to Exercise   Blood Pressure (Admit) 140/52 118/56 118/60     Blood Pressure (Exercise) 144/68 120/50 140/60     Blood Pressure (Exit) 122/56 110/54 124/76     Heart Rate (Admit) 60 bpm 58 bpm 57 bpm     Heart Rate (Exercise) 70 bpm 74 bpm 75 bpm     Heart Rate (Exit) 59 bpm 58 bpm 56 bpm     Oxygen Saturation (Admit) 98 % 97 % 96 %     Oxygen Saturation (Exercise) 97 % 97 % 95 %     Oxygen Saturation (Exit) 98 % 97 % 97 %     Rating of Perceived Exertion (Exercise) 11 13 13      Perceived Dyspnea (Exercise) 1 0 2     Duration Continue with 30 min of aerobic exercise without signs/symptoms of physical distress. Continue with 30 min of aerobic exercise without signs/symptoms of physical distress. Continue with 30 min of aerobic exercise without signs/symptoms of physical distress.     Intensity THRR unchanged THRR unchanged THRR unchanged       Progression   Progression Continue to progress workloads to maintain intensity without signs/symptoms of physical distress. Continue to progress workloads to maintain intensity without signs/symptoms of physical distress. Continue to progress workloads to maintain intensity without signs/symptoms of physical distress.     Average METs 1.4 1.9 2       Resistance Training   Training Prescription Yes Yes Yes     Weight blue bands blue bands blue bands     Reps 10-15 10-15 10-15     Time -- 10 Minutes 10 Minutes        NuStep   Level 2 3 3      SPM 80 80 80     Minutes 15 15 15      METs -- -- 1.3       Arm Ergometer   Level 1 2 2      RPM 60 60 60     Minutes 15 15 15             Exercise Comments:  Exercise Comments    Row Name 01/15/20 1624           Exercise Comments Pt completed first day of exercise with no complaints or concerns              Exercise Goals and Review:  Exercise Goals    Row Name 01/10/20 1216             Exercise Goals   Increase Physical Activity Yes       Intervention Provide advice, education, support and counseling about physical activity/exercise needs.;Develop an individualized exercise prescription for aerobic and resistive training based on initial evaluation findings, risk stratification, comorbidities and participant's personal goals.       Expected Outcomes Short Term: Attend rehab on a regular basis to increase amount of physical activity.;Long Term: Add in home exercise to make exercise part of routine and to increase amount of physical activity.;Long Term: Exercising regularly at least 3-5 days a week.       Increase Strength and Stamina Yes       Intervention  Provide advice, education, support and counseling about physical activity/exercise needs.;Develop an individualized exercise prescription for aerobic and resistive training based on initial evaluation findings, risk stratification, comorbidities and participant's personal goals.       Expected Outcomes Short Term: Increase workloads from initial exercise prescription for resistance, speed, and METs.;Short Term: Perform resistance training exercises routinely during rehab and add in resistance training at home;Long Term: Improve cardiorespiratory fitness, muscular endurance and strength as measured by increased METs and functional capacity ( )       Able to understand and use rate of perceived exertion (RPE) scale Yes       Intervention Provide education and explanation on how to use RPE scale        Expected Outcomes Short Term: Able to use RPE daily in rehab to express subjective intensity level;Long Term:  Able to use RPE to guide intensity level when exercising independently       Able to understand and use Dyspnea scale Yes       Intervention Provide education and explanation on how to use Dyspnea scale       Expected Outcomes Short Term: Able to use Dyspnea scale daily in rehab to express subjective sense of shortness of breath during exertion;Long Term: Able to use Dyspnea scale to guide intensity level when exercising independently       Knowledge and understanding of Target Heart Rate Range (THRR) Yes       Intervention Provide education and explanation of THRR including how the numbers were predicted and where they are located for reference       Expected Outcomes Short Term: Able to state/look up THRR;Long Term: Able to use THRR to govern intensity when exercising independently;Short Term: Able to use daily as guideline for intensity in rehab       Understanding of Exercise Prescription Yes       Intervention Provide education, explanation, and written materials on patient's individual exercise prescription       Expected Outcomes Short Term: Able to explain program exercise prescription;Long Term: Able to explain home exercise prescription to exercise independently              Exercise Goals Re-Evaluation :  Exercise Goals Re-Evaluation    Row Name 01/15/20 1619 02/14/20 0845           Exercise Goal Re-Evaluation   Exercise Goals Review Increase Physical Activity;Increase Strength and Stamina;Able to understand and use rate of perceived exertion (RPE) scale;Able to understand and use Dyspnea scale;Knowledge and understanding of Target Heart Rate Range (THRR);Understanding of Exercise Prescription Increase Physical Activity;Increase Strength and Stamina;Able to understand and use rate of perceived exertion (RPE) scale;Able to understand and use Dyspnea scale;Knowledge and  understanding of Target Heart Rate Range (THRR);Understanding of Exercise Prescription      Comments Pt has completed 1 exercise session with rehab and tolerated it well with no complaints or concerns. He exercised at 1.4 METS on the Nustep and 1.5 METS on the arm ergometer. We will continue to monitor and progress as he is able Pt has completed 9 exercise sessions and has been a little slow to make progressions. He has knee problems and comes in with pain most days, so we progress as he is able. He is exercising at 1.3 METS on the Nustep and 2 METS on the Arm Ergometer.      Expected Outcomes Through exercise at rehab and home the patient will decrease shortness of breath with daily activities and feel confident  in carrying out an exercise regimn at home Through exercise at rehab and home the patient will decrease shortness of breath with daily activities and feel confident in carrying out an exercise regimn at home             Discharge Exercise Prescription (Final Exercise Prescription Changes):  Exercise Prescription Changes - 02/14/20 0800      Response to Exercise   Blood Pressure (Admit) 118/60    Blood Pressure (Exercise) 140/60    Blood Pressure (Exit) 124/76    Heart Rate (Admit) 57 bpm    Heart Rate (Exercise) 75 bpm    Heart Rate (Exit) 56 bpm    Oxygen Saturation (Admit) 96 %    Oxygen Saturation (Exercise) 95 %    Oxygen Saturation (Exit) 97 %    Rating of Perceived Exertion (Exercise) 13    Perceived Dyspnea (Exercise) 2    Duration Continue with 30 min of aerobic exercise without signs/symptoms of physical distress.    Intensity THRR unchanged      Progression   Progression Continue to progress workloads to maintain intensity without signs/symptoms of physical distress.    Average METs 2      Resistance Training   Training Prescription Yes    Weight blue bands    Reps 10-15    Time 10 Minutes      NuStep   Level 3    SPM 80    Minutes 15    METs 1.3      Arm  Ergometer   Level 2    RPM 60    Minutes 15           Nutrition:  Target Goals: Understanding of nutrition guidelines, daily intake of sodium 1500mg , cholesterol 200mg , calories 30% from fat and 7% or less from saturated fats, daily to have 5 or more servings of fruits and vegetables.  Biometrics:  Pre Biometrics - 01/10/20 1615      Pre Biometrics   Grip Strength 33 kg            Nutrition Therapy Plan and Nutrition Goals:  Nutrition Therapy & Goals - 01/17/20 1424      Nutrition Therapy   Diet Carb modified/heart healthy    Drug/Food Interactions Coumadin/Vit K      Personal Nutrition Goals   Nutrition Goal Improved blood glucose control as evidenced by pt's A1c trending from 9.0 toward less than 7.0.    Personal Goal #2 CBG concentrations in the normal range or as close to normal as is safely possible.    Personal Goal #3 Pt to identify food quantities necessary to achieve weight loss of 6-24 lb at graduation from cardiac rehab.      Intervention Plan   Intervention Prescribe, educate and counsel regarding individualized specific dietary modifications aiming towards targeted core components such as weight, hypertension, lipid management, diabetes, heart failure and other comorbidities.    Expected Outcomes Short Term Goal: A plan has been developed with personal nutrition goals set during dietitian appointment.;Long Term Goal: Adherence to prescribed nutrition plan.           Nutrition Assessments:  Nutrition Assessments - 02/13/20 1418      Rate Your Plate Scores   Pre Score 58           Nutrition Goals Re-Evaluation:  Nutrition Goals Re-Evaluation    Row Name 01/17/20 1424 02/13/20 1350           Goals   Current  Weight 280 lb (127 kg) 281 lb 8.4 oz (127.7 kg)      Nutrition Goal -- Improved blood glucose control as evidenced by pt's A1c trending from 9.0 toward less than 7.0.        Personal Goal #2 Re-Evaluation   Personal Goal #2 -- CBG  concentrations in the normal range or as close to normal as is safely possible.        Personal Goal #3 Re-Evaluation   Personal Goal #3 -- Pt to identify food quantities necessary to achieve weight loss of 6-24 lb at graduation from cardiac rehab.             Nutrition Goals Discharge (Final Nutrition Goals Re-Evaluation):  Nutrition Goals Re-Evaluation - 02/13/20 1350      Goals   Current Weight 281 lb 8.4 oz (127.7 kg)    Nutrition Goal Improved blood glucose control as evidenced by pt's A1c trending from 9.0 toward less than 7.0.      Personal Goal #2 Re-Evaluation   Personal Goal #2 CBG concentrations in the normal range or as close to normal as is safely possible.      Personal Goal #3 Re-Evaluation   Personal Goal #3 Pt to identify food quantities necessary to achieve weight loss of 6-24 lb at graduation from cardiac rehab.           Psychosocial: Target Goals: Acknowledge presence or absence of significant depression and/or stress, maximize coping skills, provide positive support system. Participant is able to verbalize types and ability to use techniques and skills needed for reducing stress and depression.  Initial Review & Psychosocial Screening:  Initial Psych Review & Screening - 01/10/20 1056      Initial Review   Current issues with Current Stress Concerns    Source of Stress Concerns Unable to participate in former interests or hobbies;Unable to perform yard/household activities    Comments COVID is a big stressor for him. Unable to do things because of illness      Family Dynamics   Good Support System? Yes   His friends and roomate is his support system     Barriers   Psychosocial barriers to participate in program The patient should benefit from training in stress management and relaxation.      Screening Interventions   Interventions Encouraged to exercise    Expected Outcomes Short Term goal: Utilizing psychosocial counselor, staff and physician to  assist with identification of specific Stressors or current issues interfering with healing process. Setting desired goal for each stressor or current issue identified.;Long Term Goal: Stressors or current issues are controlled or eliminated.;Short Term goal: Identification and review with participant of any Quality of Life or Depression concerns found by scoring the questionnaire.;Long Term goal: The participant improves quality of Life and PHQ9 Scores as seen by post scores and/or verbalization of changes           Quality of Life Scores:  Scores of 19 and below usually indicate a poorer quality of life in these areas.  A difference of  2-3 points is a clinically meaningful difference.  A difference of 2-3 points in the total score of the Quality of Life Index has been associated with significant improvement in overall quality of life, self-image, physical symptoms, and general health in studies assessing change in quality of life.  PHQ-9: Recent Review Flowsheet Data    Depression screen [facility] 2/9 01/10/2020   Decreased Interest 2   Down, Depressed, Hopeless 1  PHQ - 2 Score 3   Altered sleeping 1   Tired, decreased energy 1   Change in appetite 2   Feeling bad or failure about yourself  1   Trouble concentrating 0   Moving slowly or fidgety/restless 0   Suicidal thoughts 0   PHQ-9 Score 8   Difficult doing work/chores Somewhat difficult     Interpretation of Total Score  Total Score Depression Severity:  1-4 = Minimal depression, 5-9 = Mild depression, 10-14 = Moderate depression, 15-19 = Moderately severe depression, 20-27 = Severe depression   Psychosocial Evaluation and Intervention:  Psychosocial Evaluation - 02/13/20 1227      Psychosocial Evaluation & Interventions   Interventions Stress management education;Encouraged to exercise with the program and follow exercise prescription           Psychosocial Re-Evaluation:  Psychosocial Re-Evaluation    Row Name 01/15/20  1447 02/13/20 1220           Psychosocial Re-Evaluation   Current issues with Current Stress Concerns Current Stress Concerns      Comments Pt has completed 1 exercise sessions.  Will continue to engage rapport with pt and develop a relationship of trust. Woodley is off to a good start and engages well with staff.  Pt continues to have stress with his roomate -admits at times he could have handled things better.      Expected Outcomes -- Camil will develop positive and healthy coping skills in dealing with stressful situations and he will begin to see resolution in lingering Covid related health issues      Interventions Stress management education;Encouraged to attend Pulmonary Rehabilitation for the exercise;Physician referral Encouraged to attend Pulmonary Rehabilitation for the exercise;Stress management education      Continue Psychosocial Services  Follow up required by staff Follow up required by staff      Comments -- COVID is a big stressor for him. Unable to do things because of illness        Initial Review   Source of Stress Concerns -- Unable to participate in former interests or hobbies;Unable to perform yard/household activities             Psychosocial Discharge (Final Psychosocial Re-Evaluation):  Psychosocial Re-Evaluation - 02/13/20 1220      Psychosocial Re-Evaluation   Current issues with Current Stress Concerns    Comments Cleburn is off to a good start and engages well with staff.  Pt continues to have stress with his roomate -admits at times he could have handled things better.    Expected Outcomes Loris will develop positive and healthy coping skills in dealing with stressful situations and he will begin to see resolution in lingering Covid related health issues    Interventions Encouraged to attend Pulmonary Rehabilitation for the exercise;Stress management education    Continue Psychosocial Services  Follow up required by staff    Comments COVID is a big stressor  for him. Unable to do things because of illness      Initial Review   Source of Stress Concerns Unable to participate in former interests or hobbies;Unable to perform yard/household activities           Education: Education Goals: Education classes will be provided on a weekly basis, covering required topics. Participant will state understanding/return demonstration of topics presented.  Learning Barriers/Preferences:  Learning Barriers/Preferences - 01/10/20 1102      Learning Barriers/Preferences   Learning Barriers Hearing   Right ear hearing problems  Learning Preferences Group Instruction;Skilled Demonstration;Written Material;Video           Education Topics: Risk Factor Reduction:  -Group instruction that is supported by a PowerPoint presentation. Instructor discusses the definition of a risk factor, different risk factors for pulmonary disease, and how the heart and lungs work together.     PULMONARY REHAB OTHER RESPIRATORY from 02/14/2020 in [facility] CARDIAC REHAB  Date 02/14/20  Educator Handout  Instruction Review Code 1- Verbalizes Understanding      Nutrition for Pulmonary Patient:  -Group instruction provided by PowerPoint slides, verbal discussion, and written materials to support subject matter. The instructor gives an explanation and review of healthy diet recommendations, which includes a discussion on weight management, recommendations for fruit and vegetable consumption, as well as protein, fluid, caffeine, fiber, sodium, sugar, and alcohol. Tips for eating when patients are short of breath are discussed.   Pursed Lip Breathing:  -Group instruction that is supported by demonstration and informational handouts. Instructor discusses the benefits of pursed lip and diaphragmatic breathing and detailed demonstration on how to preform both.     PULMONARY REHAB OTHER RESPIRATORY from 02/14/2020 in [facility] CARDIAC REHAB   Date 01/31/20  Educator Handout  Instruction Review Code 1- Verbalizes Understanding      Oxygen Safety:  -Group instruction provided by PowerPoint, verbal discussion, and written material to support subject matter. There is an overview of "What is Oxygen" and "Why do we need it".  Instructor also reviews how to create a safe environment for oxygen use, the importance of using oxygen as prescribed, and the risks of noncompliance. There is a brief discussion on traveling with oxygen and resources the patient may utilize.   Oxygen Equipment:  -Group instruction provided by [facility] Staff utilizing handouts, written materials, and equipment demonstrations.   Signs and Symptoms:  -Group instruction provided by written material and verbal discussion to support subject matter. Warning signs and symptoms of infection, stroke, and heart attack are reviewed and when to call the physician/911 reinforced. Tips for preventing the spread of infection discussed.   Advanced Directives:  -Group instruction provided by verbal instruction and written material to support subject matter. Instructor reviews Advanced Directive laws and proper instruction for filling out document.   Pulmonary Video:  -Group video education that reviews the importance of medication and oxygen compliance, exercise, good nutrition, pulmonary hygiene, and pursed lip and diaphragmatic breathing for the pulmonary patient.   Exercise for the Pulmonary Patient:  -Group instruction that is supported by a PowerPoint presentation. Instructor discusses benefits of exercise, core components of exercise, frequency, duration, and intensity of an exercise routine, importance of utilizing pulse oximetry during exercise, safety while exercising, and options of places to exercise outside of rehab.     Pulmonary Medications:  -Verbally interactive group education provided by instructor with focus on inhaled medications and proper  administration.   Anatomy and Physiology of the Respiratory System and Intimacy:  -Group instruction provided by PowerPoint, verbal discussion, and written material to support subject matter. Instructor reviews respiratory cycle and anatomical components of the respiratory system and their functions. Instructor also reviews differences in obstructive and restrictive respiratory diseases with examples of each. Intimacy, Sex, and Sexuality differences are reviewed with a discussion on how relationships can change when diagnosed with pulmonary disease. Common sexual concerns are reviewed.   PULMONARY REHAB OTHER RESPIRATORY from 02/14/2020 in [facility] CARDIAC REHAB  Date 02/07/20  Educator Handout  MD DAY -A group question and answer session with a medical doctor that allows participants to ask questions that relate to their pulmonary disease state.   OTHER EDUCATION -Group or individual verbal, written, or video instructions that support the educational goals of the pulmonary rehab program.   PULMONARY REHAB OTHER RESPIRATORY from 02/14/2020 in [facility] CARDIAC REHAB  Date 02/07/20  Educator handout  [facility] Plate]  Instruction Review Code 2- Demonstrated Understanding      Holiday Eating Survival Tips:  -Group instruction provided by PowerPoint slides, verbal discussion, and written materials to support subject matter. The instructor gives patients tips, tricks, and techniques to help them not only survive but enjoy the holidays despite the onslaught of food that accompanies the holidays.   Knowledge Questionnaire Score:  Knowledge Questionnaire Score - 01/10/20 1621      Knowledge Questionnaire Score   Pre Score 11/18           Core Components/Risk Factors/Patient Goals at Admission:  Personal Goals and Risk Factors at Admission - 02/13/20 1224      Core Components/Risk Factors/Patient Goals on Admission    Weight Management  Yes;Obesity    Diabetes Yes    Intervention Provide education about signs/symptoms and action to take for hypo/hyperglycemia.;Provide education about proper nutrition, including hydration, and aerobic/resistive exercise prescription along with prescribed medications to achieve blood glucose in normal ranges: Fasting glucose 65-99 mg/dL    Expected Outcomes Short Term: Participant verbalizes understanding of the signs/symptoms and immediate care of hyper/hypoglycemia, proper foot care and importance of medication, aerobic/resistive exercise and nutrition plan for blood glucose control.;Long Term: Attainment of HbA1C < 7%.    Hypertension Yes    Intervention Provide education on lifestyle modifcations including regular physical activity/exercise, weight management, moderate sodium restriction and increased consumption of fresh fruit, vegetables, and low fat dairy, alcohol moderation, and smoking cessation.;Monitor prescription use compliance.    Expected Outcomes Long Term: Maintenance of blood pressure at goal levels.;Short Term: Continued assessment and intervention until BP is < 140/71mm HG in hypertensive participants. < 130/26mm HG in hypertensive participants with diabetes, heart failure or chronic kidney disease.    Lipids Yes    Intervention Provide education and support for participant on nutrition & aerobic/resistive exercise along with prescribed medications to achieve LDL 70mg , HDL >40mg .    Expected Outcomes Short Term: Participant states understanding of desired cholesterol values and is compliant with medications prescribed. Participant is following exercise prescription and nutrition guidelines.;Long Term: Cholesterol controlled with medications as prescribed, with individualized exercise RX and with personalized nutrition plan. Value goals: LDL < 70mg , HDL > 40 mg.    Stress Yes    Intervention Offer individual and/or small group education and counseling on adjustment to heart disease,  stress management and health-related lifestyle change. Teach and support self-help strategies.;Refer participants experiencing significant psychosocial distress to appropriate mental health specialists for further evaluation and treatment. When possible, include family members and significant others in education/counseling sessions.    Expected Outcomes Short Term: Participant demonstrates changes in health-related behavior, relaxation and other stress management skills, ability to obtain effective social support, and compliance with psychotropic medications if prescribed.;Long Term: Emotional wellbeing is indicated by absence of clinically significant psychosocial distress or social isolation.           Core Components/Risk Factors/Patient Goals Review:   Goals and Risk Factor Review    Row Name 01/15/20 1449 02/13/20 1227 02/13/20 1328         Core  Components/Risk Factors/Patient Goals Review   Personal Goals Review Develop more efficient breathing techniques such as purse lipped breathing and diaphragmatic breathing and practicing self-pacing with activity.;Increase knowledge of respiratory medications and ability to use respiratory devices properly.;Improve shortness of breath with ADL's Develop more efficient breathing techniques such as purse lipped breathing and diaphragmatic breathing and practicing self-pacing with activity.;Increase knowledge of respiratory medications and ability to use respiratory devices properly.;Stress;Improve shortness of breath with ADL's;Hypertension;Lipids;Diabetes Develop more efficient breathing techniques such as purse lipped breathing and diaphragmatic breathing and practicing self-pacing with activity.;Increase knowledge of respiratory medications and ability to use respiratory devices properly.;Stress;Improve shortness of breath with ADL's;Diabetes     Review Aldyn has completed 1 exercise session.  Will continue to support pt with instruction on breathing  techniques to improve shortness of breath.  Pt observed ambulating independently - reminder to use cane for stability Jacob has completed 9 exercise sessions. Pt weight shows decrease of 2.1 kg.  Pt receptive to nutritional information provided by the RD.  pt continues to have varying blood glucose readings typical post exercise run mid to upper 100's.  Pt with blood pressure readings within normal limits - will resolve this risk factor. [facility]. Per pt last lipid panel was good.  Pt is compliant with statin therapy and avoiding foods high in fat - will resolve this risk factor.  Will continue to work with pt on positive and healhty stress management techniques. Eustace has completed 9 exercise sessions. Pt weight shows decrease of 2.1 kg.  Pt receptive to nutritional information provided by the RD.  pt continues to have varying blood glucose readings typical post exercise run mid to upper 100's.  Pt with blood pressure readings within normal limits - will resolve this risk factor. [facility]. Per pt last lipid panel was good.  Pt is compliant with statin therapy and avoiding foods high in fat - will resolve this risk factor. 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  Level 3 on nustep and level 2 on the arm crank.  Will continue to work with pt on positive and healhty stress management techniques.     Expected Outcomes See Admission Goals See Admission Goals --            Core Components/Risk Factors/Patient Goals at Discharge (Final Review):   Goals and Risk Factor Review - 02/13/20 1328      Core Components/Risk Factors/Patient Goals Review   Personal Goals Review Develop more efficient breathing techniques such as purse lipped breathing and diaphragmatic breathing and practicing self-pacing with activity.;Increase knowledge of respiratory medications and ability to  use respiratory devices properly.;Stress;Improve shortness of breath with ADL's;Diabetes    Review Rolf has completed 9 exercise sessions. Pt weight shows decrease of 2.1 kg.  Pt receptive to nutritional information provided by the RD.  pt continues to have varying blood glucose readings typical post exercise run mid to upper 100's.  Pt with blood pressure readings within normal limits - will resolve this risk factor. [facility]. Per pt last lipid panel was good.  Pt is compliant with statin therapy and avoiding foods high in fat - will resolve this risk factor. 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  Level 3 on nustep and level 2 on the  arm crank.  Will continue to work with pt on positive and healhty stress management techniques.           ITP Comments:   Comments:  Colston has completed 10 exercise session in Pulmonary rehab. Pt maintains good attendance and consistent home exercise. Pulmonary rehab staff will  continue to monitor and reassess progress toward goals during her participation in Pulmonary Rehab. Alanson Aly, BSN Cardiac and Emergency planning/management officer

## 2020-02-19 ENCOUNTER — Other Ambulatory Visit: Payer: Self-pay

## 2020-02-19 ENCOUNTER — Encounter (HOSPITAL_COMMUNITY)
Admission: RE | Admit: 2020-02-19 | Discharge: 2020-02-19 | Disposition: A | Discharge status: Home / Self Care | Payer: No Typology Code available for payment source | Source: Ambulatory Visit | Attending: Cardiology | Admitting: Cardiology

## 2020-02-19 VITALS — Wt 284.0 lb

## 2020-02-19 DIAGNOSIS — R0600 Dyspnea, unspecified: Secondary | ICD-10-CM | POA: Insufficient documentation

## 2020-02-19 DIAGNOSIS — R0609 Other forms of dyspnea: Secondary | ICD-10-CM

## 2020-02-19 NOTE — Progress Notes (Signed)
Daily Session Note  Patient Details  Name: Adam Ponce MRN: [id number] Date of Birth: [birth year] Referring Provider:     Pulmonary Rehab Walk Test from 01/10/2020 in [place]  Referring Provider Dr. Radford Pax ([place])      Encounter Date: 02/19/2020  Check In:  Session Check In - 02/19/20 1341      Check-In   Supervising physician immediately available to respond to emergencies [facility] immediately available    Physician(s) Dr. Darrick Meigs    Location [facility]    Staff Present Rosebud Poles, RN, BSN;Lisa Ysidro Evert, RN;Jo-Ann Johanning Hassell Done, MS, ACSM-CEP, Exercise Physiologist    Virtual Visit No    Medication changes reported     No    Fall or balance concerns reported    No    Tobacco Cessation No Change    Warm-up and Cool-down Performed as group-led instruction    Resistance Training Performed Yes    VAD Patient? No    PAD/SET Patient? No      Pain Assessment   Currently in Pain? No/denies    Multiple Pain Sites No           Capillary Blood Glucose: No results found for this or any previous visit (from the past 24 hour(s)).   Exercise Prescription Changes - 02/19/20 1400      Response to Exercise   Blood Pressure (Admit) 122/72    Blood Pressure (Exercise) 130/78    Blood Pressure (Exit) 118/64    Heart Rate (Admit) 61 bpm    Heart Rate (Exercise) 72 bpm    Heart Rate (Exit) 58 bpm    Oxygen Saturation (Admit) 97 %    Oxygen Saturation (Exercise) 96 %    Oxygen Saturation (Exit) 98 %    Rating of Perceived Exertion (Exercise) 11    Perceived Dyspnea (Exercise) 2    Duration Continue with 30 min of aerobic exercise without signs/symptoms of physical distress.    Intensity THRR unchanged      Progression   Progression Continue to progress workloads to maintain intensity without signs/symptoms of physical distress.      Resistance Training   Training Prescription Yes    Weight Blue bands    Reps 10-15    Time 10  Minutes      NuStep   Level 4    SPM 80    Minutes 15    METs 1.5      Arm Ergometer   Level 2.5    RPM 60    Minutes 15           Social History   Tobacco Use  Smoking Status Former Smoker  . Packs/day: 1.00  . Types: Cigarettes  Smokeless Tobacco Never Used    Goals Met:  Proper associated with RPD/PD & O2 Sat Exercise tolerated well No report of cardiac concerns or symptoms Strength training completed today  Goals Unmet:  Not Applicable  Comments: Service time is from 1305 to 1412    Dr. Fransico Him is Medical Director for Cardiac Rehab at [facility].

## 2020-02-21 ENCOUNTER — Encounter (HOSPITAL_COMMUNITY): Payer: No Typology Code available for payment source

## 2020-02-26 ENCOUNTER — Other Ambulatory Visit: Payer: Self-pay

## 2020-02-26 ENCOUNTER — Encounter (HOSPITAL_COMMUNITY)
Admission: RE | Admit: 2020-02-26 | Discharge: 2020-02-26 | Disposition: A | Discharge status: Home / Self Care | Payer: No Typology Code available for payment source | Source: Ambulatory Visit | Attending: Cardiology | Admitting: Cardiology

## 2020-02-26 DIAGNOSIS — R0600 Dyspnea, unspecified: Secondary | ICD-10-CM

## 2020-02-26 DIAGNOSIS — R0609 Other forms of dyspnea: Secondary | ICD-10-CM

## 2020-02-26 NOTE — Progress Notes (Signed)
Daily Session Note  Patient Details  Name: Adam Ponce MRN: [id number] Date of Birth: [birth year] Referring Provider:     Pulmonary Rehab Walk Test from 01/10/2020 in [place]  Referring Provider Dr. Radford Pax ([place])      Encounter Date: 02/26/2020  Check In:  Session Check In - 02/26/20 1431      Check-In   Supervising physician immediately available to respond to emergencies [facility] immediately available    Physician(s) Dr. Verlon Au    Location [facility]    Staff Present Rosebud Poles, RN, BSN;Lisa Ysidro Evert, RN;Sundy Houchins Hassell Done, MS, ACSM-CEP, Exercise Physiologist    Virtual Visit No    Medication changes reported     No    Fall or balance concerns reported    No    Tobacco Cessation No Change    Warm-up and Cool-down Performed as group-led instruction    Resistance Training Performed Yes    VAD Patient? No    PAD/SET Patient? No      Pain Assessment   Currently in Pain? No/denies    Multiple Pain Sites No           Capillary Blood Glucose: No results found for this or any previous visit (from the past 24 hour(s)).    Social History   Tobacco Use  Smoking Status Former Smoker  . Packs/day: 1.00  . Types: Cigarettes  Smokeless Tobacco Never Used    Goals Met:  Proper associated with RPD/PD & O2 Sat Exercise tolerated well No report of cardiac concerns or symptoms Strength training completed today  Goals Unmet:  Not Applicable  Comments: Service time is from 1300 to 1415    Dr. Fransico Him is Medical Director for Cardiac Rehab at [facility].

## 2020-02-26 NOTE — Progress Notes (Signed)
Nutrition Note  Reviewed pt diet recall and follow up on goals.  Choosing more protein, veg, and fruits. Less sugary drinks, he has started using SF powders in his water. He is snacking a lot to keep blood sugars from dropping. He has not had any low blood sugars but feels fearful of having them.  He thinks he could try eating smaller snacks every 4 hours. Morning CBGs still 170 mg/dl. Was not able to incorporate peddling after meals but is doing his stretches/band exercises sometimes.  Reviewed label reading to help pt with snack and meal choices. He was able to walk RD through the label.  Goals: Choose snacks 100-200 calories Choose meaks <60 g carbs  Will continue to monitor pt.  Andrey Campanile, MS, RDN, LDN

## 2020-02-28 ENCOUNTER — Other Ambulatory Visit: Payer: Self-pay

## 2020-02-28 ENCOUNTER — Encounter (HOSPITAL_COMMUNITY)
Admission: RE | Admit: 2020-02-28 | Discharge: 2020-02-28 | Disposition: A | Discharge status: Home / Self Care | Payer: No Typology Code available for payment source | Source: Ambulatory Visit | Attending: Cardiology | Admitting: Cardiology

## 2020-02-28 DIAGNOSIS — R0609 Other forms of dyspnea: Secondary | ICD-10-CM

## 2020-02-28 DIAGNOSIS — R0600 Dyspnea, unspecified: Secondary | ICD-10-CM | POA: Diagnosis not present

## 2020-02-28 NOTE — Progress Notes (Signed)
Daily Session Note  Patient Details  Name: Adam Ponce MRN: [id number] Date of Birth: Feb 02, 1951 Referring Provider:     Pulmonary Rehab Walk Test from 01/10/2020 in [place]  Referring Provider Dr. Radford Pax ([place])      Encounter Date: 02/28/2020  Check In:  Session Check In - 02/28/20 1409      Check-In   Supervising physician immediately available to respond to emergencies [facility] immediately available    Physician(s) Dr. Wynelle [place]    Location [facility]    Staff Present Rosebud Poles, RN, BSN;Reine Bristow Ysidro Evert, RN;Jessica Hassell Done, MS, ACSM-CEP, Exercise Physiologist    Virtual Visit No    Medication changes reported     No    Fall or balance concerns reported    No    Tobacco Cessation No Change    Warm-up and Cool-down Performed on first and last piece of equipment    Resistance Training Performed Yes    VAD Patient? No    PAD/SET Patient? No      Pain Assessment   Currently in Pain? No/denies    Pain Score 0-No pain    Multiple Pain Sites No           Capillary Blood Glucose: No results found for this or any previous visit (from the past 24 hour(s)).    Social History   Tobacco Use  Smoking Status Former Smoker  . Packs/day: 1.00  . Types: Cigarettes  Smokeless Tobacco Never Used    Goals Met:  Exercise tolerated well No report of cardiac concerns or symptoms Strength training completed today  Goals Unmet:  Not Applicable  Comments: Service time is from 1300 to 1405    Dr. Fransico Him is Medical Director for Cardiac Rehab at [facility].

## 2020-03-04 ENCOUNTER — Encounter (HOSPITAL_COMMUNITY)
Admission: RE | Admit: 2020-03-04 | Discharge: 2020-03-04 | Disposition: A | Discharge status: Home / Self Care | Payer: No Typology Code available for payment source | Source: Ambulatory Visit | Attending: Cardiology | Admitting: Cardiology

## 2020-03-04 ENCOUNTER — Other Ambulatory Visit: Payer: Self-pay

## 2020-03-04 VITALS — Wt 282.0 lb

## 2020-03-04 DIAGNOSIS — R0600 Dyspnea, unspecified: Secondary | ICD-10-CM

## 2020-03-04 DIAGNOSIS — R0609 Other forms of dyspnea: Secondary | ICD-10-CM

## 2020-03-04 NOTE — Progress Notes (Signed)
Daily Session Note  Patient Details  Name: Adam Ponce MRN: [id number] Date of Birth: [birth year] Referring Provider:     Pulmonary Rehab Walk Test from 01/10/2020 in [place]  Referring Provider Dr. Radford Pax ([place])      Encounter Date: 03/04/2020  Check In:  Session Check In - 03/04/20 1439      Check-In   Supervising physician immediately available to respond to emergencies [facility] immediately available    Physician(s) Dr. Ree Kida    Location [facility]    Staff Present Rosebud Poles, RN, BSN;Nicolai Labonte Ysidro Evert, RN;Jessica Hassell Done, MS, ACSM-CEP, Exercise Physiologist    Virtual Visit No    Medication changes reported     No    Fall or balance concerns reported    No    Tobacco Cessation No Change    Warm-up and Cool-down Performed on first and last piece of equipment    Resistance Training Performed Yes    VAD Patient? No    PAD/SET Patient? No      Pain Assessment   Currently in Pain? No/denies    Pain Score 0-No pain    Multiple Pain Sites No           Capillary Blood Glucose: No results found for this or any previous visit (from the past 24 hour(s)).  POCT Glucose - 03/04/20 1454      POCT Blood Glucose   Pre-Exercise 156 mg/dL    Post-Exercise 292 mg/dL           Exercise Prescription Changes - 03/04/20 1400      Response to Exercise   Blood Pressure (Admit) 116/60    Blood Pressure (Exercise) 100/64    Blood Pressure (Exit) 120/64    Heart Rate (Admit) 77 bpm    Heart Rate (Exercise) 76 bpm    Heart Rate (Exit) 76 bpm    Oxygen Saturation (Admit) 96 %    Oxygen Saturation (Exercise) 96 %    Oxygen Saturation (Exit) 97 %    Rating of Perceived Exertion (Exercise) 11    Perceived Dyspnea (Exercise) 2    Duration Continue with 45 min of aerobic exercise without signs/symptoms of physical distress.    Intensity THRR unchanged      Progression   Progression Continue to progress workloads to maintain  intensity without signs/symptoms of physical distress.      Resistance Training   Training Prescription Yes    Weight Blue bands    Reps 10-15    Time 10 Minutes      NuStep   Level 4    SPM 80    Minutes 15    METs 1.4      Arm Ergometer   Level 2.5    RPM 60    Minutes 15           Social History   Tobacco Use  Smoking Status Former Smoker  . Packs/day: 1.00  . Types: Cigarettes  Smokeless Tobacco Never Used    Goals Met:  Exercise tolerated well No report of cardiac concerns or symptoms Strength training completed today  Goals Unmet:  Not Applicable  Comments: Service time is from 1308 to 1415    Dr. Fransico Him is Medical Director for Cardiac Rehab at [facility].

## 2020-03-06 ENCOUNTER — Encounter (HOSPITAL_COMMUNITY)
Admission: RE | Admit: 2020-03-06 | Discharge: 2020-03-06 | Disposition: A | Discharge status: Home / Self Care | Payer: No Typology Code available for payment source | Source: Ambulatory Visit | Attending: Cardiology | Admitting: Cardiology

## 2020-03-06 ENCOUNTER — Other Ambulatory Visit: Payer: Self-pay

## 2020-03-06 DIAGNOSIS — R0600 Dyspnea, unspecified: Secondary | ICD-10-CM

## 2020-03-06 DIAGNOSIS — R0609 Other forms of dyspnea: Secondary | ICD-10-CM

## 2020-03-06 NOTE — Progress Notes (Signed)
Daily Session Note  Patient Details  Name: HASSELL PATRAS MRN: [id number] Date of Birth: 09/06/1950 Referring Provider:     Pulmonary Rehab Walk Test from 01/10/2020 in [place]  Referring Provider Dr. Radford Pax ([place])      Encounter Date: 03/06/2020  Check In:  Session Check In - 03/06/20 1343      Check-In   Supervising physician immediately available to respond to emergencies [facility] immediately available    Physician(s) Dr. Algis Liming    Location [facility]    Staff Present Rosebud Poles, RN, BSN;Jamileth Putzier Ysidro Evert, RN;Jessica Hassell Done, MS, ACSM-CEP, Exercise Physiologist    Virtual Visit No    Medication changes reported     No    Fall or balance concerns reported    No    Tobacco Cessation No Change    Warm-up and Cool-down Performed as group-led instruction    Resistance Training Performed Yes    VAD Patient? No    PAD/SET Patient? No      Pain Assessment   Currently in Pain? No/denies    Multiple Pain Sites No           Capillary Blood Glucose: No results found for this or any previous visit (from the past 24 hour(s)).    Social History   Tobacco Use  Smoking Status Former Smoker  . Packs/day: 1.00  . Types: Cigarettes  Smokeless Tobacco Never Used    Goals Met:  Exercise tolerated well No report of cardiac concerns or symptoms Strength training completed today  Goals Unmet:  Not Applicable  Comments: Service time is from 1305 to 1410    Dr. Fransico Him is Medical Director for Cardiac Rehab at [facility].

## 2020-03-06 NOTE — Progress Notes (Signed)
I have reviewed a Home Exercise Prescription with Henrine Screws . Hershy is currently exercising at home. He states he does chair exercises and uses his resistance bands 2-3 times per week. The patient was advised to continue with chair exercises and resistance bands at least 3 days a week for 15-30 minutes.  Fayrene Fearing and I discussed how to progress their exercise prescription.  The patient stated that their goals were to remain healthy and decrease shortness of breath.  The patient stated that they understand the exercise prescription.  We reviewed exercise guidelines, target heart rate during exercise, RPE Scale, weather conditions, Rescue Inhaler use, endpoints for exercise, warmup and cool down.  Patient is encouraged to come to me with any questions. I will continue to follow up with the patient to assist them with progression and safety.    Norris Cross MS, ACSM CEP

## 2020-03-10 NOTE — Progress Notes (Signed)
Pulmonary Individual Treatment Plan  Patient Details  Name: Adam Ponce MRN: [id number] Date of Birth: September 29, 1950 Referring Provider:     Pulmonary Rehab Walk Test from 01/10/2020 in [facility] CARDIAC [facility]  Referring Provider Dr. Mayford Knife ([place])      Initial Encounter Date:    Pulmonary Rehab Walk Test from 01/10/2020 in [facility] CARDIAC REHAB  Date 01/10/20      Visit Diagnosis: Dyspnea on exertion  Patient's Home Medications on Admission:   Current Outpatient Medications:    albuterol (PROVENTIL HFA;VENTOLIN HFA) 108 (90 Base) MCG/ACT inhaler, Inhale into the lungs every 6 (six) hours as needed for wheezing or shortness of breath., Disp: , Rfl:    betamethasone valerate (VALISONE) 0.1 % cream, Apply topically 2 (two) times daily., Disp: , Rfl:    cetirizine (ZYRTEC) 10 MG tablet, Take 10 mg by mouth daily., Disp: , Rfl:    Cholecalciferol (VITAMIN D3) 10000 units TABS, Take by mouth., Disp: , Rfl:    ciprofloxacin-dexamethasone (CIPRODEX) otic suspension, 4 drops 2 (two) times daily., Disp: , Rfl:    cyanocobalamin (,VITAMIN B-12,) 1000 MCG/ML injection, Inject 1,000 mcg into the muscle once., Disp: , Rfl:    darunavir (PREZISTA) 800 MG tablet, Take 800 mg by mouth., Disp: , Rfl:    docusate sodium (COLACE) 100 MG capsule, Take 100 mg by mouth 2 (two) times daily., Disp: , Rfl:    EMTRICITABINE-TENOFOVIR AF PO, Take by mouth., Disp: , Rfl:    fenofibrate (TRICOR) 145 MG tablet, Take 145 mg by mouth daily., Disp: , Rfl:    glipiZIDE (GLUCOTROL) 10 MG tablet, Take 10 mg by mouth daily before breakfast., Disp: , Rfl:    INSULIN GLARGINE [place], Inject into the skin., Disp: , Rfl:    lisinopril (PRINIVIL,ZESTRIL) 40 MG tablet, Take 40 mg by mouth daily., Disp: , Rfl:    magnesium citrate SOLN, Take 1 Bottle by mouth once., Disp: , Rfl:    metFORMIN (GLUCOPHAGE) 1000 MG tablet, Take 1,000 mg by mouth 2 (two) times daily with a meal., Disp:  , Rfl:    pravastatin (PRAVACHOL) 80 MG tablet, Take 80 mg by mouth daily., Disp: , Rfl:    pregabalin (LYRICA) 75 MG capsule, Take 75 mg by mouth 2 (two) times daily., Disp: , Rfl:    Psyllium 48.57 % POWD, Take by mouth., Disp: , Rfl:    ranitidine (ZANTAC) 150 MG capsule, Take 150 mg by mouth 2 (two) times daily., Disp: , Rfl:    ritonavir (NORVIR) 100 MG TABS tablet, Take by mouth., Disp: , Rfl:    sodium chloride (OCEAN) 0.65 % nasal spray, Place 1 spray into the nose as needed for congestion., Disp: , Rfl:    traMADol (ULTRAM) 50 MG tablet, Take by mouth every 6 (six) hours as needed., Disp: , Rfl:    warfarin (COUMADIN) 5 MG tablet, Take 5 mg by mouth daily., Disp: , Rfl:   Past Medical History: Past Medical History:  Diagnosis Date   Arthritis    Asthma    Constipation    Diabetes mellitus    HIV disease (HCC) 1995   Hypertension     Tobacco Use: Social History   Tobacco Use  Smoking Status Former Smoker   Packs/day: 1.00   Types: Cigarettes  Smokeless Tobacco Never Used    Labs: Recent Review Flowsheet Data   There is no flowsheet data to display.     Capillary Blood Glucose: Lab Results  Component Value Date   GLUCAP 100 (H) 08/23/2008   GLUCAP 118 (H) 08/23/2008   GLUCAP 116 (H) 08/22/2008   GLUCAP 101 (H) 08/22/2008   GLUCAP 103 (H) 08/22/2008    POCT Glucose    Row Name 01/29/20 1447 02/19/20 1459 03/04/20 1454         POCT Blood Glucose   Pre-Exercise 167 mg/dL -- 161 mg/dL     Post-Exercise 096 mg/dL -- 045 mg/dL     Pre-Exercise #2 409 mg/dL -- --     Post-Exercise #2 171 mg/dL -- --     Pre-Exercise #3 193 mg/dL -- --     Post-Exercise #3 122 mg/dL -- --     Random 811 mg/dL 914 mg/dL --            Pulmonary Assessment Scores:  Pulmonary Assessment Scores    Row Name 01/10/20 1621 01/10/20 1642       ADL UCSD   ADL Phase Entry --    SOB Score total 61 --      CAT Score   CAT Score 8  entry --      mMRC Score    mMRC Score -- 4          UCSD: Self-administered rating of dyspnea associated with activities of daily living (ADLs) 6-point scale (0 = "not at all" to 5 = "maximal or unable to do because of breathlessness")  Scoring Scores range from 0 to 120.  Minimally important difference is 5 units  CAT: CAT can identify the health impairment of COPD patients and is better correlated with disease progression.  CAT has a scoring range of zero to 40. The CAT score is classified into four groups of low (less than 10), medium (10 - 20), high (21-30) and very high (31-40) based on the impact level of disease on health status. A CAT score over 10 suggests significant symptoms.  A worsening CAT score could be explained by an exacerbation, poor medication adherence, poor inhaler technique, or progression of COPD or comorbid conditions.  CAT MCID is 2 points  mMRC: mMRC (Modified Medical Research Council) Dyspnea Scale is used to assess the degree of baseline functional disability in patients of respiratory disease due to dyspnea. No minimal important difference is established. A decrease in score of 1 point or greater is considered a positive change.   Pulmonary Function Assessment:   Exercise Target Goals: Exercise Program Goal: Individual exercise prescription set using results from initial 6 min walk test and THRR while considering  patients activity barriers and safety.   Exercise Prescription Goal: Initial exercise prescription builds to 30-45 minutes a day of aerobic activity, 2-3 days per week.  Home exercise guidelines will be given to patient during program as part of exercise prescription that the participant will acknowledge.  Activity Barriers & Risk Stratification:  Activity Barriers & Cardiac Risk Stratification - 01/10/20 1053      Activity Barriers & Cardiac Risk Stratification   Activity Barriers Arthritis;Joint Problems;Right Hip Replacement;Left Hip  Replacement;Deconditioning;Assistive Device    Cardiac Risk Stratification High           6 Minute Walk:  6 Minute Walk    Row Name 01/10/20 1217         6 Minute Walk   Phase Initial     Distance 942 feet     Walk Time 6 minutes     # of Rest Breaks 0     MPH 1.78  METS 2.03     RPE 13     Perceived Dyspnea  1     VO2 Peak 7.1     Symptoms No     Resting HR 60 bpm     Resting BP 126/62     Resting Oxygen Saturation  96 %     Exercise Oxygen Saturation  during 6 min walk 92 %     Max Ex. HR 126 bpm     Max Ex. BP 140/56     2 Minute Post BP 136/60       Interval HR   1 Minute HR 89     2 Minute HR 95     3 Minute HR 94     4 Minute HR 107     5 Minute HR 122     6 Minute HR 126     2 Minute Post HR 60     Interval Heart Rate? Yes       Interval Oxygen   Interval Oxygen? Yes     Baseline Oxygen Saturation % 96 %     1 Minute Oxygen Saturation % 94 %     1 Minute Liters of Oxygen 0 L     2 Minute Oxygen Saturation % 94 %     2 Minute Liters of Oxygen 0 L     3 Minute Oxygen Saturation % 92 %     3 Minute Liters of Oxygen 0 L     4 Minute Oxygen Saturation % 94 %     4 Minute Liters of Oxygen 0 L     5 Minute Oxygen Saturation % 94 %     5 Minute Liters of Oxygen 0 L     6 Minute Oxygen Saturation % 95 %     6 Minute Liters of Oxygen 0 L     2 Minute Post Oxygen Saturation % 97 %     2 Minute Post Liters of Oxygen 0 L            Oxygen Initial Assessment:  Oxygen Initial Assessment - 01/10/20 1217      Initial 6 min Walk   Oxygen Used None      Program Oxygen Prescription   Program Oxygen Prescription None           Oxygen Re-Evaluation:  Oxygen Re-Evaluation    Row Name 01/15/20 1625 02/14/20 0847 03/06/20 0802         Program Oxygen Prescription   Program Oxygen Prescription None None None       Home Oxygen   Home Oxygen Device None None None     Sleep Oxygen Prescription CPAP CPAP CPAP     Liters per minute --  does not use  -- --     Home Exercise Oxygen Prescription None None None     Home Resting Oxygen Prescription None None None     Compliance with Home Oxygen Use No  Does not use CPAP as prescribed No No  Does not use CPAP as prescribed     Comments -- --  Does not use CPAP --       Goals/Expected Outcomes   Short Term Goals To learn and exhibit compliance with exercise, home and travel O2 prescription;To learn and understand importance of monitoring SPO2 with pulse oximeter and demonstrate accurate use of the pulse oximeter.;To learn and understand importance of maintaining oxygen saturations>88%;To learn and demonstrate proper pursed lip breathing techniques or  other breathing techniques.;To learn and demonstrate proper use of respiratory medications To learn and exhibit compliance with exercise, home and travel O2 prescription;To learn and understand importance of monitoring SPO2 with pulse oximeter and demonstrate accurate use of the pulse oximeter.;To learn and understand importance of maintaining oxygen saturations>88%;To learn and demonstrate proper pursed lip breathing techniques or other breathing techniques.;To learn and demonstrate proper use of respiratory medications To learn and exhibit compliance with exercise, home and travel O2 prescription;To learn and understand importance of monitoring SPO2 with pulse oximeter and demonstrate accurate use of the pulse oximeter.;To learn and understand importance of maintaining oxygen saturations>88%;To learn and demonstrate proper pursed lip breathing techniques or other breathing techniques.;To learn and demonstrate proper use of respiratory medications     Long  Term Goals Exhibits compliance with exercise, home and travel O2 prescription;Verbalizes importance of monitoring SPO2 with pulse oximeter and return demonstration;Maintenance of O2 saturations>88%;Exhibits proper breathing techniques, such as pursed lip breathing or other method taught during program  session;Compliance with respiratory medication;Demonstrates proper use of MDIs Exhibits compliance with exercise, home and travel O2 prescription;Verbalizes importance of monitoring SPO2 with pulse oximeter and return demonstration;Maintenance of O2 saturations>88%;Exhibits proper breathing techniques, such as pursed lip breathing or other method taught during program session;Compliance with respiratory medication;Demonstrates proper use of MDIs Exhibits compliance with exercise, home and travel O2 prescription;Verbalizes importance of monitoring SPO2 with pulse oximeter and return demonstration;Maintenance of O2 saturations>88%;Exhibits proper breathing techniques, such as pursed lip breathing or other method taught during program session;Compliance with respiratory medication;Demonstrates proper use of MDIs     Comments -- -- Pt does not use CPAP as prescribed, states it interferes with his sleep.     Goals/Expected Outcomes compliance and understanding of oxygen saturation and pursed lip breathing compliance and understanding of oxygen saturation and pursed lip breathing compliance and understanding of oxygen saturation and pursed lip breathing            Oxygen Discharge (Final Oxygen Re-Evaluation):  Oxygen Re-Evaluation - 03/06/20 0802      Program Oxygen Prescription   Program Oxygen Prescription None      Home Oxygen   Home Oxygen Device None    Sleep Oxygen Prescription CPAP    Home Exercise Oxygen Prescription None    Home Resting Oxygen Prescription None    Compliance with Home Oxygen Use No   Does not use CPAP as prescribed     Goals/Expected Outcomes   Short Term Goals To learn and exhibit compliance with exercise, home and travel O2 prescription;To learn and understand importance of monitoring SPO2 with pulse oximeter and demonstrate accurate use of the pulse oximeter.;To learn and understand importance of maintaining oxygen saturations>88%;To learn and demonstrate proper  pursed lip breathing techniques or other breathing techniques.;To learn and demonstrate proper use of respiratory medications    Long  Term Goals Exhibits compliance with exercise, home and travel O2 prescription;Verbalizes importance of monitoring SPO2 with pulse oximeter and return demonstration;Maintenance of O2 saturations>88%;Exhibits proper breathing techniques, such as pursed lip breathing or other method taught during program session;Compliance with respiratory medication;Demonstrates proper use of MDIs    Comments Pt does not use CPAP as prescribed, states it interferes with his sleep.    Goals/Expected Outcomes compliance and understanding of oxygen saturation and pursed lip breathing           Initial Exercise Prescription:  Initial Exercise Prescription - 01/11/20 1400      Date of Initial Exercise RX and Referring Provider  Date 01/10/20    Referring Provider Dr. Mayford Knife ([place])    Expected Discharge Date 03/20/20      NuStep   Level 2    SPM 80    Minutes 15      Arm Ergometer   Level 1    RPM 60    Minutes 15      Prescription Details   Frequency (times per week) 2    Duration Progress to 30 minutes of continuous aerobic without signs/symptoms of physical distress      Intensity   THRR 40-80% of Max Heartrate 60-121    Ratings of Perceived Exertion 11-13    Perceived Dyspnea 0-4      Progression   Progression Continue to progress workloads to maintain intensity without signs/symptoms of physical distress.      Resistance Training   Training Prescription Yes    Weight blue bands    Reps 10-15           Perform Capillary Blood Glucose checks as needed.  Exercise Prescription Changes:  Exercise Prescription Changes    Row Name 01/15/20 1600 01/29/20 1400 02/14/20 0800 02/19/20 1400 03/04/20 1400     Response to Exercise   Blood Pressure (Admit) 140/52 118/56 118/60 122/72 116/60   Blood Pressure (Exercise) 144/68 120/50 140/60 130/78 100/64   Blood  Pressure (Exit) 122/56 110/54 124/76 118/64 120/64   Heart Rate (Admit) 60 bpm 58 bpm 57 bpm 61 bpm 77 bpm   Heart Rate (Exercise) 70 bpm 74 bpm 75 bpm 72 bpm 76 bpm   Heart Rate (Exit) 59 bpm 58 bpm 56 bpm 58 bpm 76 bpm   Oxygen Saturation (Admit) 98 % 97 % 96 % 97 % 96 %   Oxygen Saturation (Exercise) 97 % 97 % 95 % 96 % 96 %   Oxygen Saturation (Exit) 98 % 97 % 97 % 98 % 97 %   Rating of Perceived Exertion (Exercise) 11 13 13 11 11    Perceived Dyspnea (Exercise) 1 0 2 2 2    Duration Continue with 30 min of aerobic exercise without signs/symptoms of physical distress. Continue with 30 min of aerobic exercise without signs/symptoms of physical distress. Continue with 30 min of aerobic exercise without signs/symptoms of physical distress. Continue with 30 min of aerobic exercise without signs/symptoms of physical distress. Continue with 45 min of aerobic exercise without signs/symptoms of physical distress.   Intensity THRR unchanged THRR unchanged THRR unchanged THRR unchanged THRR unchanged     Progression   Progression Continue to progress workloads to maintain intensity without signs/symptoms of physical distress. Continue to progress workloads to maintain intensity without signs/symptoms of physical distress. Continue to progress workloads to maintain intensity without signs/symptoms of physical distress. Continue to progress workloads to maintain intensity without signs/symptoms of physical distress. Continue to progress workloads to maintain intensity without signs/symptoms of physical distress.   Average METs 1.4 1.9 2 -- --     Resistance Training   Training Prescription Yes Yes Yes Yes Yes   Weight blue bands blue bands blue bands Blue bands Blue bands   Reps 10-15 10-15 10-15 10-15 10-15   Time -- 10 Minutes 10 Minutes 10 Minutes 10 Minutes     NuStep   Level 2 3 3 4 4    SPM 80 80 80 80 80   Minutes 15 15 15 15 15    METs -- -- 1.3 1.5 1.4     Arm Ergometer   Level 1 2 2  2.5  2.5   RPM 60 60 60 60 60   Minutes 15 15 15 15 15    Row Name 03/06/20 1500             Home Exercise Plan   Plans to continue exercise at Home (comment)       Frequency Add 3 additional days to program exercise sessions.       Initial Home Exercises Provided 03/06/20              Exercise Comments:  Exercise Comments    Row Name 01/15/20 1624 03/06/20 1526         Exercise Comments Pt completed first day of exercise with no complaints or concerns Completed home exercise prescription. Pt states he does chair exercises and resistance bands 2-3 times per week. We discussed importance of continuing exercise after he finishes rehab. Pt was receptive             Exercise Goals and Review:  Exercise Goals    Row Name 01/10/20 1216             Exercise Goals   Increase Physical Activity Yes       Intervention Provide advice, education, support and counseling about physical activity/exercise needs.;Develop an individualized exercise prescription for aerobic and resistive training based on initial evaluation findings, risk stratification, comorbidities and participant's personal goals.       Expected Outcomes Short Term: Attend rehab on a regular basis to increase amount of physical activity.;Long Term: Add in home exercise to make exercise part of routine and to increase amount of physical activity.;Long Term: Exercising regularly at least 3-5 days a week.       Increase Strength and Stamina Yes       Intervention Provide advice, education, support and counseling about physical activity/exercise needs.;Develop an individualized exercise prescription for aerobic and resistive training based on initial evaluation findings, risk stratification, comorbidities and participant's personal goals.       Expected Outcomes Short Term: Increase workloads from initial exercise prescription for resistance, speed, and METs.;Short Term: Perform resistance training exercises routinely during rehab  and add in resistance training at home;Long Term: Improve cardiorespiratory fitness, muscular endurance and strength as measured by increased METs and functional capacity ( )       Able to understand and use rate of perceived exertion (RPE) scale Yes       Intervention Provide education and explanation on how to use RPE scale       Expected Outcomes Short Term: Able to use RPE daily in rehab to express subjective intensity level;Long Term:  Able to use RPE to guide intensity level when exercising independently       Able to understand and use Dyspnea scale Yes       Intervention Provide education and explanation on how to use Dyspnea scale       Expected Outcomes Short Term: Able to use Dyspnea scale daily in rehab to express subjective sense of shortness of breath during exertion;Long Term: Able to use Dyspnea scale to guide intensity level when exercising independently       Knowledge and understanding of Target Heart Rate Range (THRR) Yes       Intervention Provide education and explanation of THRR including how the numbers were predicted and where they are located for reference       Expected Outcomes Short Term: Able to state/look up THRR;Long Term: Able to use THRR to govern intensity when exercising independently;Short Term:  Able to use daily as guideline for intensity in rehab       Understanding of Exercise Prescription Yes       Intervention Provide education, explanation, and written materials on patient's individual exercise prescription       Expected Outcomes Short Term: Able to explain program exercise prescription;Long Term: Able to explain home exercise prescription to exercise independently              Exercise Goals Re-Evaluation :  Exercise Goals Re-Evaluation    Row Name 01/15/20 1619 02/14/20 0845 03/06/20 0759         Exercise Goal Re-Evaluation   Exercise Goals Review Increase Physical Activity;Increase Strength and Stamina;Able to understand and use rate of  perceived exertion (RPE) scale;Able to understand and use Dyspnea scale;Knowledge and understanding of Target Heart Rate Range (THRR);Understanding of Exercise Prescription Increase Physical Activity;Increase Strength and Stamina;Able to understand and use rate of perceived exertion (RPE) scale;Able to understand and use Dyspnea scale;Knowledge and understanding of Target Heart Rate Range (THRR);Understanding of Exercise Prescription Increase Physical Activity;Increase Strength and Stamina;Able to understand and use rate of perceived exertion (RPE) scale;Able to understand and use Dyspnea scale;Knowledge and understanding of Target Heart Rate Range (THRR);Understanding of Exercise Prescription     Comments Pt has completed 1 exercise session with rehab and tolerated it well with no complaints or concerns. He exercised at 1.4 METS on the Nustep and 1.5 METS on the arm ergometer. We will continue to monitor and progress as he is able Pt has completed 9 exercise sessions and has been a little slow to make progressions. He has knee problems and comes in with pain most days, so we progress as he is able. He is exercising at 1.3 METS on the Nustep and 2 METS on the Arm Ergometer. Pt has completed 14 exercise sessions and has been slow to make progression with METS, although he is making progression with workloads. Pt gets distracted very easily on the exercise machines and struglles to maintain a consisten pace. He is exercising at 1.4 METS on the Nustep and 1.6 METS on the Arm Ergometer. Will continue to monitor and progress as he is able.     Expected Outcomes Through exercise at rehab and home the patient will decrease shortness of breath with daily activities and feel confident in carrying out an exercise regimn at home Through exercise at rehab and home the patient will decrease shortness of breath with daily activities and feel confident in carrying out an exercise regimn at home Through exercise at rehab and home  the patient will decrease shortness of breath with daily activities and feel confident in carrying out an exercise regimn at home            Discharge Exercise Prescription (Final Exercise Prescription Changes):  Exercise Prescription Changes - 03/06/20 1500      Home Exercise Plan   Plans to continue exercise at Home (comment)    Frequency Add 3 additional days to program exercise sessions.    Initial Home Exercises Provided 03/06/20           Nutrition:  Target Goals: Understanding of nutrition guidelines, daily intake of sodium 1500mg , cholesterol 200mg , calories 30% from fat and 7% or less from saturated fats, daily to have 5 or more servings of fruits and vegetables.  Biometrics:  Pre Biometrics - 01/10/20 1615      Pre Biometrics   Grip Strength 33 kg  Nutrition Therapy Plan and Nutrition Goals:  Nutrition Therapy & Goals - 01/17/20 1424      Nutrition Therapy   Diet Carb modified/heart healthy    Drug/Food Interactions Coumadin/Vit K      Personal Nutrition Goals   Nutrition Goal Improved blood glucose control as evidenced by pt's A1c trending from 9.0 toward less than 7.0.    Personal Goal #2 CBG concentrations in the normal range or as close to normal as is safely possible.    Personal Goal #3 Pt to identify food quantities necessary to achieve weight loss of 6-24 lb at graduation from cardiac rehab.      Intervention Plan   Intervention Prescribe, educate and counsel regarding individualized specific dietary modifications aiming towards targeted core components such as weight, hypertension, lipid management, diabetes, heart failure and other comorbidities.    Expected Outcomes Short Term Goal: A plan has been developed with personal nutrition goals set during dietitian appointment.;Long Term Goal: Adherence to prescribed nutrition plan.           Nutrition Assessments:  Nutrition Assessments - 02/13/20 1418      Rate Your Plate Scores    Pre Score 58          MEDIFICTS Score Key:  ?70 Need to make dietary changes   40-70 Heart Healthy Diet  ? 40 Therapeutic Level Cholesterol Diet   Picture Your Plate Scores:  <63 Unhealthy dietary pattern with much room for improvement.  41-50 Dietary pattern unlikely to meet recommendations for good health and room for improvement.  51-60 More healthful dietary pattern, with some room for improvement.   >60 Healthy dietary pattern, although there may be some specific behaviors that could be improved.    Nutrition Goals Re-Evaluation:  Nutrition Goals Re-Evaluation    Row Name 01/17/20 1424 02/13/20 1350 03/06/20 0847         Goals   Current Weight 280 lb (127 kg) 281 lb 8.4 oz (127.7 kg) 281 lb 15.5 oz (127.9 kg)     Nutrition Goal -- Improved blood glucose control as evidenced by pt's A1c trending from 9.0 toward less than 7.0. Improved blood glucose control as evidenced by pt's A1c trending from 9.0 toward less than 7.0.     Comment -- -- Reviewed plate method and pairing carbs with protein/fat to manage blood sugars. He is working on choosing healthier snacks to reduce highly processed foods.       Personal Goal #2 Re-Evaluation   Personal Goal #2 -- CBG concentrations in the normal range or as close to normal as is safely possible. CBG concentrations in the normal range or as close to normal as is safely possible.       Personal Goal #3 Re-Evaluation   Personal Goal #3 -- Pt to identify food quantities necessary to achieve weight loss of 6-24 lb at graduation from cardiac rehab. Pt to identify food quantities necessary to achieve weight loss of 6-24 lb at graduation from cardiac rehab.            Nutrition Goals Discharge (Final Nutrition Goals Re-Evaluation):  Nutrition Goals Re-Evaluation - 03/06/20 0847      Goals   Current Weight 281 lb 15.5 oz (127.9 kg)    Nutrition Goal Improved blood glucose control as evidenced by pt's A1c trending from 9.0 toward less  than 7.0.    Comment Reviewed plate method and pairing carbs with protein/fat to manage blood sugars. He is working on choosing healthier snacks to  reduce highly processed foods.      Personal Goal #2 Re-Evaluation   Personal Goal #2 CBG concentrations in the normal range or as close to normal as is safely possible.      Personal Goal #3 Re-Evaluation   Personal Goal #3 Pt to identify food quantities necessary to achieve weight loss of 6-24 lb at graduation from cardiac rehab.           Psychosocial: Target Goals: Acknowledge presence or absence of significant depression and/or stress, maximize coping skills, provide positive support system. Participant is able to verbalize types and ability to use techniques and skills needed for reducing stress and depression.  Initial Review & Psychosocial Screening:  Initial Psych Review & Screening - 01/10/20 1056      Initial Review   Current issues with Current Stress Concerns    Source of Stress Concerns Unable to participate in former interests or hobbies;Unable to perform yard/household activities    Comments COVID is a big stressor for him. Unable to do things because of illness      Family Dynamics   Good Support System? Yes   His friends and roomate is his support system     Barriers   Psychosocial barriers to participate in program The patient should benefit from training in stress management and relaxation.      Screening Interventions   Interventions Encouraged to exercise    Expected Outcomes Short Term goal: Utilizing psychosocial counselor, staff and physician to assist with identification of specific Stressors or current issues interfering with healing process. Setting desired goal for each stressor or current issue identified.;Long Term Goal: Stressors or current issues are controlled or eliminated.;Short Term goal: Identification and review with participant of any Quality of Life or Depression concerns found by scoring the  questionnaire.;Long Term goal: The participant improves quality of Life and PHQ9 Scores as seen by post scores and/or verbalization of changes           Quality of Life Scores:  Scores of 19 and below usually indicate a poorer quality of life in these areas.  A difference of  2-3 points is a clinically meaningful difference.  A difference of 2-3 points in the total score of the Quality of Life Index has been associated with significant improvement in overall quality of life, self-image, physical symptoms, and general health in studies assessing change in quality of life.  PHQ-9: Recent Review Flowsheet Data    Depression screen [facility] 2/9 01/10/2020   Decreased Interest 2   Down, Depressed, Hopeless 1   PHQ - 2 Score 3   Altered sleeping 1   Tired, decreased energy 1   Change in appetite 2   Feeling bad or failure about yourself  1   Trouble concentrating 0   Moving slowly or fidgety/restless 0   Suicidal thoughts 0   PHQ-9 Score 8   Difficult doing work/chores Somewhat difficult     Interpretation of Total Score  Total Score Depression Severity:  1-4 = Minimal depression, 5-9 = Mild depression, 10-14 = Moderate depression, 15-19 = Moderately severe depression, 20-27 = Severe depression   Psychosocial Evaluation and Intervention:  Psychosocial Evaluation - 02/13/20 1227      Psychosocial Evaluation & Interventions   Interventions Stress management education;Encouraged to exercise with the program and follow exercise prescription           Psychosocial Re-Evaluation:  Psychosocial Re-Evaluation    Row Name 01/15/20 1447 02/13/20 1220 03/10/20 1247  Psychosocial Re-Evaluation   Current issues with Current Stress Concerns Current Stress Concerns Current Stress Concerns     Comments Pt has completed 1 exercise sessions.  Will continue to engage rapport with pt and develop a relationship of trust. Adam Ponce is off to a good start and engages well with staff.  Pt continues  to have stress with his roomate -admits at times he could have handled things better. Adam Ponce seems to enjoy exercising in pulmonary rehab and is handling his chronic illness stressor well.  Some days it is hard for him to exercise d/t bilateral knee pain.     Expected Outcomes -- Adam Ponce will develop positive and healthy coping skills in dealing with stressful situations and he will begin to see resolution in lingering Covid related health issues For Adam Ponce to handle his chronic illness stress in healthy ways.     Interventions Stress management education;Encouraged to attend Pulmonary Rehabilitation for the exercise;Physician referral Encouraged to attend Pulmonary Rehabilitation for the exercise;Stress management education Stress management education;Encouraged to attend Pulmonary Rehabilitation for the exercise     Continue Psychosocial Services  Follow up required by staff Follow up required by staff Follow up required by staff     Comments -- COVID is a big stressor for him. Unable to do things because of illness --       Initial Review   Source of Stress Concerns -- Unable to participate in former interests or hobbies;Unable to perform yard/household activities Chronic Illness;Unable to perform yard/household activities;Unable to participate in former interests or hobbies            Psychosocial Discharge (Final Psychosocial Re-Evaluation):  Psychosocial Re-Evaluation - 03/10/20 1247      Psychosocial Re-Evaluation   Current issues with Current Stress Concerns    Comments Adam Ponce seems to enjoy exercising in pulmonary rehab and is handling his chronic illness stressor well.  Some days it is hard for him to exercise d/t bilateral knee pain.    Expected Outcomes For Adam Ponce to handle his chronic illness stress in healthy ways.    Interventions Stress management education;Encouraged to attend Pulmonary Rehabilitation for the exercise    Continue Psychosocial Services  Follow up required by staff       Initial Review   Source of Stress Concerns Chronic Illness;Unable to perform yard/household activities;Unable to participate in former interests or hobbies           Education: Education Goals: Education classes will be provided on a weekly basis, covering required topics. Participant will state understanding/return demonstration of topics presented.  Learning Barriers/Preferences:  Learning Barriers/Preferences - 01/10/20 1102      Learning Barriers/Preferences   Learning Barriers Hearing   Right ear hearing problems   Learning Preferences Group Instruction;Skilled Demonstration;Written Material;Video           Education Topics: Risk Factor Reduction:  -Group instruction that is supported by a PowerPoint presentation. Instructor discusses the definition of a risk factor, different risk factors for pulmonary disease, and how the heart and lungs work together.     PULMONARY REHAB OTHER RESPIRATORY from 03/06/2020 in [facility] CARDIAC REHAB  Date 03/06/20  [ow to beat a sedentary lifestyle]  Educator Handout  [Label reading]      Nutrition for Pulmonary Patient:  -Group instruction provided by PowerPoint slides, verbal discussion, and written materials to support subject matter. The instructor gives an explanation and review of healthy diet recommendations, which includes a discussion on weight management, recommendations for fruit  and vegetable consumption, as well as protein, fluid, caffeine, fiber, sodium, sugar, and alcohol. Tips for eating when patients are short of breath are discussed.   Pursed Lip Breathing:  -Group instruction that is supported by demonstration and informational handouts. Instructor discusses the benefits of pursed lip and diaphragmatic breathing and detailed demonstration on how to preform both.     PULMONARY REHAB OTHER RESPIRATORY from 03/06/2020 in [facility] CARDIAC REHAB  Date 01/31/20  Educator Handout   Instruction Review Code 1- Verbalizes Understanding      Oxygen Safety:  -Group instruction provided by PowerPoint, verbal discussion, and written material to support subject matter. There is an overview of What is Oxygen and Why do we need it.  Instructor also reviews how to create a safe environment for oxygen use, the importance of using oxygen as prescribed, and the risks of noncompliance. There is a brief discussion on traveling with oxygen and resources the patient may utilize.   Oxygen Equipment:  -Group instruction provided by [facility] Staff utilizing handouts, written materials, and equipment demonstrations.   Signs and Symptoms:  -Group instruction provided by written material and verbal discussion to support subject matter. Warning signs and symptoms of infection, stroke, and heart attack are reviewed and when to call the physician/911 reinforced. Tips for preventing the spread of infection discussed.   Advanced Directives:  -Group instruction provided by verbal instruction and written material to support subject matter. Instructor reviews Advanced Directive laws and proper instruction for filling out document.   Pulmonary Video:  -Group video education that reviews the importance of medication and oxygen compliance, exercise, good nutrition, pulmonary hygiene, and pursed lip and diaphragmatic breathing for the pulmonary patient.   Exercise for the Pulmonary Patient:  -Group instruction that is supported by a PowerPoint presentation. Instructor discusses benefits of exercise, core components of exercise, frequency, duration, and intensity of an exercise routine, importance of utilizing pulse oximetry during exercise, safety while exercising, and options of places to exercise outside of rehab.     Pulmonary Medications:  -Verbally interactive group education provided by instructor with focus on inhaled medications and proper administration.   Anatomy and Physiology  of the Respiratory System and Intimacy:  -Group instruction provided by PowerPoint, verbal discussion, and written material to support subject matter. Instructor reviews respiratory cycle and anatomical components of the respiratory system and their functions. Instructor also reviews differences in obstructive and restrictive respiratory diseases with examples of each. Intimacy, Sex, and Sexuality differences are reviewed with a discussion on how relationships can change when diagnosed with pulmonary disease. Common sexual concerns are reviewed.   PULMONARY REHAB OTHER RESPIRATORY from 03/06/2020 in [facility] CARDIAC REHAB  Date 02/07/20  Educator Handout      MD DAY -A group question and answer session with a medical doctor that allows participants to ask questions that relate to their pulmonary disease state.   OTHER EDUCATION -Group or individual verbal, written, or video instructions that support the educational goals of the pulmonary rehab program.   PULMONARY REHAB OTHER RESPIRATORY from 03/06/2020 in [facility] CARDIAC REHAB  Date 02/07/20  Educator handout  [facility] Plate]  Instruction Review Code 2- Demonstrated Understanding      Holiday Eating Survival Tips:  -Group instruction provided by PowerPoint slides, verbal discussion, and written materials to support subject matter. The instructor gives patients tips, tricks, and techniques to help them not only survive but enjoy the holidays despite the onslaught of food  that accompanies the holidays.   Knowledge Questionnaire Score:  Knowledge Questionnaire Score - 01/10/20 1621      Knowledge Questionnaire Score   Pre Score 11/18           Core Components/Risk Factors/Patient Goals at Admission:  Personal Goals and Risk Factors at Admission - 02/13/20 1224      Core Components/Risk Factors/Patient Goals on Admission    Weight Management Yes;Obesity    Diabetes Yes    Intervention Provide  education about signs/symptoms and action to take for hypo/hyperglycemia.;Provide education about proper nutrition, including hydration, and aerobic/resistive exercise prescription along with prescribed medications to achieve blood glucose in normal ranges: Fasting glucose 65-99 mg/dL    Expected Outcomes Short Term: Participant verbalizes understanding of the signs/symptoms and immediate care of hyper/hypoglycemia, proper foot care and importance of medication, aerobic/resistive exercise and nutrition plan for blood glucose control.;Long Term: Attainment of HbA1C < 7%.    Hypertension Yes    Intervention Provide education on lifestyle modifcations including regular physical activity/exercise, weight management, moderate sodium restriction and increased consumption of fresh fruit, vegetables, and low fat dairy, alcohol moderation, and smoking cessation.;Monitor prescription use compliance.    Expected Outcomes Long Term: Maintenance of blood pressure at goal levels.;Short Term: Continued assessment and intervention until BP is < 140/69mm HG in hypertensive participants. < 130/17mm HG in hypertensive participants with diabetes, heart failure or chronic kidney disease.    Lipids Yes    Intervention Provide education and support for participant on nutrition & aerobic/resistive exercise along with prescribed medications to achieve LDL 70mg , HDL >40mg .    Expected Outcomes Short Term: Participant states understanding of desired cholesterol values and is compliant with medications prescribed. Participant is following exercise prescription and nutrition guidelines.;Long Term: Cholesterol controlled with medications as prescribed, with individualized exercise RX and with personalized nutrition plan. Value goals: LDL < 70mg , HDL > 40 mg.    Stress Yes    Intervention Offer individual and/or small group education and counseling on adjustment to heart disease, stress management and health-related lifestyle change.  Teach and support self-help strategies.;Refer participants experiencing significant psychosocial distress to appropriate mental health specialists for further evaluation and treatment. When possible, include family members and significant others in education/counseling sessions.    Expected Outcomes Short Term: Participant demonstrates changes in health-related behavior, relaxation and other stress management skills, ability to obtain effective social support, and compliance with psychotropic medications if prescribed.;Long Term: Emotional wellbeing is indicated by absence of clinically significant psychosocial distress or social isolation.           Core Components/Risk Factors/Patient Goals Review:   Goals and Risk Factor Review    Row Name 01/15/20 1449 02/13/20 1227 02/13/20 1328 03/10/20 1251       Core Components/Risk Factors/Patient Goals Review   Personal Goals Review Develop more efficient breathing techniques such as purse lipped breathing and diaphragmatic breathing and practicing self-pacing with activity.;Increase knowledge of respiratory medications and ability to use respiratory devices properly.;Improve shortness of breath with ADL's Develop more efficient breathing techniques such as purse lipped breathing and diaphragmatic breathing and practicing self-pacing with activity.;Increase knowledge of respiratory medications and ability to use respiratory devices properly.;Stress;Improve shortness of breath with ADL's;Hypertension;Lipids;Diabetes Develop more efficient breathing techniques such as purse lipped breathing and diaphragmatic breathing and practicing self-pacing with activity.;Increase knowledge of respiratory medications and ability to use respiratory devices properly.;Stress;Improve shortness of breath with ADL's;Diabetes Develop more efficient breathing techniques such as purse lipped breathing and diaphragmatic breathing and practicing  self-pacing with activity.;Increase  knowledge of respiratory medications and ability to use respiratory devices properly.;Improve shortness of breath with ADL's    Review Adam Ponce has completed 1 exercise session.  Will continue to support pt with instruction on breathing techniques to improve shortness of breath.  Pt observed ambulating independently - reminder to use cane for stability Adam Ponce has completed 9 exercise sessions. Pt weight shows decrease of 2.1 kg.  Pt receptive to nutritional information provided by the RD.  pt continues to have varying blood glucose readings typical post exercise run mid to upper 100's.  Pt with blood pressure readings within normal limits - will resolve this risk factor. [facility]. Per pt last lipid panel was good.  Pt is compliant with statin therapy and avoiding foods high in fat - will resolve this risk factor.  Will continue to work with pt on positive and healhty stress management techniques. Adam Ponce has completed 9 exercise sessions. Pt weight shows decrease of 2.1 kg.  Pt receptive to nutritional information provided by the RD.  pt continues to have varying blood glucose readings typical post exercise run mid to upper 100's.  Pt with blood pressure readings within normal limits - will resolve this risk factor. [facility]. Per pt last lipid panel was good.  Pt is compliant with statin therapy and avoiding foods high in fat - will resolve this risk factor. 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  Level 3 on nustep and level 2 on the arm crank.  Will continue to work with pt on positive and healhty stress management techniques. Adam Ponce will graduate in 2 weeks from the program and has improved his strength and stamina and is exercising on level 5 of the nustep and level 2.5 of the arm ergomenter.  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    Expected Outcomes  See Admission Goals See Admission Goals -- For Adam Ponce to continue exercising at home after graduation from pulmonary rehab.           Core Components/Risk Factors/Patient Goals at Discharge (Final Review):   Goals and Risk Factor Review - 03/10/20 1251      Core Components/Risk Factors/Patient Goals Review   Personal Goals Review Develop more efficient breathing techniques such as purse lipped breathing and diaphragmatic breathing and practicing self-pacing with activity.;Increase knowledge of respiratory medications and ability to use respiratory devices properly.;Improve shortness of breath with ADL's    Review Adam Ponce will graduate in 2 weeks from the program and has improved his strength and stamina and is exercising on level 5 of the nustep and level 2.5 of the arm ergomenter.  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    Expected Outcomes For Adam Ponce to continue exercising at home after graduation from pulmonary rehab.           ITP Comments:   Comments: ITP REVIEW Pt is making expected progress toward pulmonary rehab goals after completing 15 sessions. Recommend continued exercise, life style modification, education, and utilization of breathing techniques to increase stamina and strength and decrease shortness of breath with exertion.

## 2020-03-11 ENCOUNTER — Encounter (HOSPITAL_COMMUNITY)
Admission: RE | Admit: 2020-03-11 | Discharge: 2020-03-11 | Disposition: A | Discharge status: Home / Self Care | Payer: No Typology Code available for payment source | Source: Ambulatory Visit | Attending: Cardiology | Admitting: Cardiology

## 2020-03-11 ENCOUNTER — Other Ambulatory Visit: Payer: Self-pay

## 2020-03-11 DIAGNOSIS — R0600 Dyspnea, unspecified: Secondary | ICD-10-CM

## 2020-03-11 DIAGNOSIS — R0609 Other forms of dyspnea: Secondary | ICD-10-CM

## 2020-03-11 NOTE — Progress Notes (Signed)
Daily Session Note  Patient Details  Name: Adam Ponce MRN: [id number] Date of Birth: 02-22-1951 Referring Provider:     Pulmonary Rehab Walk Test from 01/10/2020 in [place]  Referring Provider Dr. Radford Pax ([place])      Encounter Date: 03/11/2020  Check In:  Session Check In - 03/11/20 1345      Check-In   Supervising physician immediately available to respond to emergencies [facility] immediately available    Physician(s) Dr. Wynelle Cleveland    Location [facility]    Staff Present Rosebud Poles, RN, BSN;Sahian Kerney Ysidro Evert, RN;Jessica Hassell Done, MS, ACSM-CEP, Exercise Physiologist    Virtual Visit No    Medication changes reported     No    Fall or balance concerns reported    No    Tobacco Cessation No Change    Warm-up and Cool-down Performed as group-led instruction    Resistance Training Performed Yes    VAD Patient? No    PAD/SET Patient? No      Pain Assessment   Currently in Pain? No/denies    Multiple Pain Sites No           Capillary Blood Glucose: No results found for this or any previous visit (from the past 24 hour(s)).    Social History   Tobacco Use  Smoking Status Former Smoker   Packs/day: 1.00   Types: Cigarettes  Smokeless Tobacco Never Used    Goals Met:  Exercise tolerated well No report of cardiac concerns or symptoms Strength training completed today  Goals Unmet:  Not Applicable  Comments: Service time is from 1300 to 1410    Dr. Fransico Him is Medical Director for Cardiac Rehab at [facility].

## 2020-03-18 ENCOUNTER — Encounter (HOSPITAL_COMMUNITY)
Admission: RE | Admit: 2020-03-18 | Discharge: 2020-03-18 | Disposition: A | Discharge status: Home / Self Care | Payer: No Typology Code available for payment source | Source: Ambulatory Visit | Attending: Cardiology | Admitting: Cardiology

## 2020-03-18 ENCOUNTER — Other Ambulatory Visit: Payer: Self-pay

## 2020-03-18 VITALS — Wt 280.6 lb

## 2020-03-18 DIAGNOSIS — R0609 Other forms of dyspnea: Secondary | ICD-10-CM

## 2020-03-18 DIAGNOSIS — R0600 Dyspnea, unspecified: Secondary | ICD-10-CM

## 2020-03-18 NOTE — Progress Notes (Signed)
Daily Session Note  Patient Details  Name: Adam Ponce MRN: [id number] Date of Birth: [birth year] Referring Provider:     Pulmonary Rehab Walk Test from 01/10/2020 in [place]  Referring Provider Dr. Radford Pax ([place])      Encounter Date: 03/18/2020  Check In:  Session Check In - 03/18/20 1421      Check-In   Supervising physician immediately available to respond to emergencies [facility] immediately available    Physician(s) Dr. Tyrell Antonio    Location [facility]    Staff Present Rosebud Poles, RN, BSN;Lisa Ysidro Evert, RN;Alyssamarie Mounsey Hassell Done, MS, ACSM-CEP, Exercise Physiologist    Virtual Visit No    Medication changes reported     No    Fall or balance concerns reported    No    Tobacco Cessation No Change    Warm-up and Cool-down Performed as group-led instruction    Resistance Training Performed No    VAD Patient? No    PAD/SET Patient? No      Pain Assessment   Currently in Pain? No/denies    Multiple Pain Sites No           Capillary Blood Glucose: No results found for this or any previous visit (from the past 24 hour(s)).  POCT Glucose - 03/18/20 1508      POCT Blood Glucose   Pre-Exercise 162 mg/dL    Post-Exercise 99 mg/dL           Exercise Prescription Changes - 03/18/20 1500      Response to Exercise   Blood Pressure (Admit) 144/74    Blood Pressure (Exercise) 150/78    Blood Pressure (Exit) 120/64    Heart Rate (Admit) 75 bpm    Heart Rate (Exercise) 75 bpm    Heart Rate (Exit) 56 bpm    Oxygen Saturation (Admit) 97 %    Oxygen Saturation (Exercise) 96 %    Oxygen Saturation (Exit) 98 %    Rating of Perceived Exertion (Exercise) 11    Perceived Dyspnea (Exercise) 2    Duration Continue with 45 min of aerobic exercise without signs/symptoms of physical distress.    Intensity THRR unchanged      Progression   Progression Continue to progress workloads to maintain intensity without signs/symptoms of  physical distress.      Resistance Training   Training Prescription Yes    Weight Blue bands    Reps 10-15    Time 10 Minutes      NuStep   Level 5    SPM 80    Minutes 15    METs 1.6      Arm Ergometer   Level 2.5    Minutes 15    METs 1.6           Social History   Tobacco Use  Smoking Status Former Smoker  . Packs/day: 1.00  . Types: Cigarettes  Smokeless Tobacco Never Used    Goals Met:  Proper associated with RPD/PD & O2 Sat Exercise tolerated well No report of cardiac concerns or symptoms Strength training completed today  Goals Unmet:  Not Applicable  Comments: Service time is from 1300 to 1445    Dr. Fransico Him is Medical Director for Cardiac Rehab at [facility].

## 2020-03-20 ENCOUNTER — Other Ambulatory Visit: Payer: Self-pay

## 2020-03-20 ENCOUNTER — Encounter (HOSPITAL_COMMUNITY)
Admission: RE | Admit: 2020-03-20 | Discharge: 2020-03-20 | Disposition: A | Discharge status: Home / Self Care | Payer: No Typology Code available for payment source | Source: Ambulatory Visit | Attending: Cardiology | Admitting: Cardiology

## 2020-03-20 DIAGNOSIS — R0600 Dyspnea, unspecified: Secondary | ICD-10-CM | POA: Diagnosis present

## 2020-03-20 DIAGNOSIS — R0609 Other forms of dyspnea: Secondary | ICD-10-CM

## 2020-03-20 NOTE — Progress Notes (Signed)
Daily Session Note  Patient Details  Name: Adam Ponce MRN: [id number] Date of Birth: 1950-05-03 Referring Provider:     Pulmonary Rehab Walk Test from 01/10/2020 in [place]  Referring Provider Dr. Radford Pax ([place])      Encounter Date: 03/20/2020  Check In:  Session Check In - 03/20/20 1334      Check-In   Supervising physician immediately available to respond to emergencies [facility] immediately available    Physician(s) Dr. Roderic Palau    Location [facility]    Staff Present Rosebud Poles, RN, Isaac Laud, MS, ACSM-CEP, Exercise Physiologist;Lisa Ysidro Evert, RN    Virtual Visit No    Medication changes reported     No    Fall or balance concerns reported    No    Tobacco Cessation No Change    Warm-up and Cool-down Not performed (comment)    Resistance Training Performed No    VAD Patient? No    PAD/SET Patient? No      Pain Assessment   Currently in Pain? No/denies    Multiple Pain Sites No           Capillary Blood Glucose: No results found for this or any previous visit (from the past 24 hour(s)).    Social History   Tobacco Use  Smoking Status Former Smoker  . Packs/day: 1.00  . Types: Cigarettes  Smokeless Tobacco Never Used    Goals Met:  Proper associated with RPD/PD & O2 Sat Achieving weight loss Exercise tolerated well No report of cardiac concerns or symptoms  Goals Unmet:  Not Applicable  Comments: Service time is from 1300 to 1330 Patient completed post 6 minute walk test today and completed pulmonary rehab program. He plans on returning to pulmonary rehab for our maintenance program.    Dr. Fransico Him is Medical Director for Cardiac Rehab at [facility].

## 2020-04-10 NOTE — Progress Notes (Signed)
Discharge Progress Report  Patient Details  Name: Adam Ponce MRN: [id number] Date of Birth: 05/17/1950 Referring Provider:   April Manson Pulmonary Rehab Walk Test from 01/10/2020 in [place]  Referring Provider Dr. Radford Pax ([place])       Number of Visits: 18  Reason for Discharge:  Patient reached a stable level of exercise. Patient independent in their exercise. Patient has met program and personal goals.  Smoking History:  Social History   Tobacco Use  Smoking Status Former Smoker  . Packs/day: 1.00  . Types: Cigarettes  Smokeless Tobacco Never Used    Diagnosis:  Dyspnea on exertion  ADL UCSD:  Pulmonary Assessment Scores    Row Name 01/10/20 1621 01/10/20 1642 03/25/20 0931     ADL UCSD   ADL Phase Entry -- Exit   SOB Score total 61 -- 69     CAT Score   CAT Score 8  entry -- 27     mMRC Score   mMRC Score -- 4 3          Initial Exercise Prescription:  Initial Exercise Prescription - 01/11/20 1400      Date of Initial Exercise RX and Referring Provider   Date 01/10/20    Referring Provider Dr. Radford Pax ([place])    Expected Discharge Date 03/20/20      NuStep   Level 2    SPM 80    Minutes 15      Arm Ergometer   Level 1    RPM 60    Minutes 15      Prescription Details   Frequency (times per week) 2    Duration Progress to 30 minutes of continuous aerobic without signs/symptoms of physical distress      Intensity   THRR 40-80% of Max Heartrate 60-121    Ratings of Perceived Exertion 11-13    Perceived Dyspnea 0-4      Progression   Progression Continue to progress workloads to maintain intensity without signs/symptoms of physical distress.      Resistance Training   Training Prescription Yes    Weight blue bands    Reps 10-15           Discharge Exercise Prescription (Final Exercise Prescription Changes):  Exercise Prescription Changes - 03/18/20 1500      Response to Exercise   Blood Pressure  (Admit) 144/74    Blood Pressure (Exercise) 150/78    Blood Pressure (Exit) 120/64    Heart Rate (Admit) 75 bpm    Heart Rate (Exercise) 75 bpm    Heart Rate (Exit) 56 bpm    Oxygen Saturation (Admit) 97 %    Oxygen Saturation (Exercise) 96 %    Oxygen Saturation (Exit) 98 %    Rating of Perceived Exertion (Exercise) 11    Perceived Dyspnea (Exercise) 2    Duration Continue with 45 min of aerobic exercise without signs/symptoms of physical distress.    Intensity THRR unchanged      Progression   Progression Continue to progress workloads to maintain intensity without signs/symptoms of physical distress.      Resistance Training   Training Prescription Yes    Weight Blue bands    Reps 10-15    Time 10 Minutes      NuStep   Level 5    SPM 80    Minutes 15    METs 1.6      Arm Ergometer   Level 2.5  Minutes 15    METs 1.6           Functional Capacity:  6 Minute Walk    Row Name 01/10/20 1217 03/20/20 1435       6 Minute Walk   Phase Initial Discharge    Distance 942 feet 800 feet    Distance % Change -- -15.07 %    Distance Feet Change -- -142 ft    Walk Time 6 minutes 6 minutes    # of Rest Breaks 0 1  Rested at minute 4 due to knee pain and shortness of breath    MPH 1.78 1.5    METS 2.03 1.59    RPE 13 15    Perceived Dyspnea  1 3    VO2 Peak 7.1 5.58    Symptoms No Yes (comment)    Comments -- Pt has chronic bilater knee pain that severely affects his walking    Resting HR 60 bpm 61 bpm    Resting BP 126/62 114/58    Resting Oxygen Saturation  96 % 97 %    Exercise Oxygen Saturation  during 6 min walk 92 % 96 %    Max Ex. HR 126 bpm 110 bpm    Max Ex. BP 140/56 140/60    2 Minute Post BP 136/60 122/54         Interval HR   1 Minute HR 89 110    2 Minute HR 95 110    3 Minute HR 94 109    4 Minute HR 107 110    5 Minute HR 122 88    6 Minute HR 126 107    2 Minute Post HR 60 54    Interval Heart Rate? Yes Yes         Interval Oxygen    Interval Oxygen? Yes Yes    Baseline Oxygen Saturation % 96 % 97 %    1 Minute Oxygen Saturation % 94 % 96 %    1 Minute Liters of Oxygen 0 L 0 L    2 Minute Oxygen Saturation % 94 % 96 %    2 Minute Liters of Oxygen 0 L 0 L    3 Minute Oxygen Saturation % 92 % 97 %    3 Minute Liters of Oxygen 0 L 0 L    4 Minute Oxygen Saturation % 94 % 97 %    4 Minute Liters of Oxygen 0 L 0 L    5 Minute Oxygen Saturation % 94 % 98 %    5 Minute Liters of Oxygen 0 L 0 L    6 Minute Oxygen Saturation % 95 % 97 %    6 Minute Liters of Oxygen 0 L 0 L    2 Minute Post Oxygen Saturation % 97 % 98 %    2 Minute Post Liters of Oxygen 0 L 0 L           Psychological, QOL, Others - Outcomes: PHQ 2/9: Depression screen [facility] 2/9 03/20/2020 01/10/2020  Decreased Interest 0 2  Down, Depressed, Hopeless 0 1  PHQ - 2 Score 0 3  Altered sleeping - 1  Tired, decreased energy 0 1  Change in appetite 0 2  Feeling bad or failure about yourself  0 1  Trouble concentrating 0 0  Moving slowly or fidgety/restless 0 0  Suicidal thoughts 0 0  PHQ-9 Score - 8  Difficult doing work/chores Not difficult at all Somewhat difficult  Quality of Life:   Personal Goals: Goals established at orientation with interventions provided to work toward goal.  Personal Goals and Risk Factors at Admission - 02/13/20 1224      Core Components/Risk Factors/Patient Goals on Admission    Weight Management Yes;Obesity    Diabetes Yes    Intervention Provide education about signs/symptoms and action to take for hypo/hyperglycemia.;Provide education about proper nutrition, including hydration, and aerobic/resistive exercise prescription along with prescribed medications to achieve blood glucose in normal ranges: Fasting glucose 65-99 mg/dL    Expected Outcomes Short Term: Participant verbalizes understanding of the signs/symptoms and immediate care of hyper/hypoglycemia, proper foot care and importance of medication,  aerobic/resistive exercise and nutrition plan for blood glucose control.;Long Term: Attainment of HbA1C < 7%.    Hypertension Yes    Intervention Provide education on lifestyle modifcations including regular physical activity/exercise, weight management, moderate sodium restriction and increased consumption of fresh fruit, vegetables, and low fat dairy, alcohol moderation, and smoking cessation.;Monitor prescription use compliance.    Expected Outcomes Long Term: Maintenance of blood pressure at goal levels.;Short Term: Continued assessment and intervention until BP is < 140/2m HG in hypertensive participants. < 130/880mHG in hypertensive participants with diabetes, heart failure or chronic kidney disease.    Lipids Yes    Intervention Provide education and support for participant on nutrition & aerobic/resistive exercise along with prescribed medications to achieve LDL <7039mHDL >14m64m  Expected Outcomes Short Term: Participant states understanding of desired cholesterol values and is compliant with medications prescribed. Participant is following exercise prescription and nutrition guidelines.;Long Term: Cholesterol controlled with medications as prescribed, with individualized exercise RX and with personalized nutrition plan. Value goals: LDL < 70mg68mL > 40 mg.    Stress Yes    Intervention Offer individual and/or small group education and counseling on adjustment to heart disease, stress management and health-related lifestyle change. Teach and support self-help strategies.;Refer participants experiencing significant psychosocial distress to appropriate mental health specialists for further evaluation and treatment. When possible, include family members and significant others in education/counseling sessions.    Expected Outcomes Short Term: Participant demonstrates changes in health-related behavior, relaxation and other stress management skills, ability to obtain effective social support, and  compliance with psychotropic medications if prescribed.;Long Term: Emotional wellbeing is indicated by absence of clinically significant psychosocial distress or social isolation.            Personal Goals Discharge:  Goals and Risk Factor Review    Row Name 01/15/20 1449 02/13/20 1227 02/13/20 1328 03/10/20 1251       Core Components/Risk Factors/Patient Goals Review   Personal Goals Review Develop more efficient breathing techniques such as purse lipped breathing and diaphragmatic breathing and practicing self-pacing with activity.;Increase knowledge of respiratory medications and ability to use respiratory devices properly.;Improve shortness of breath with ADL's Develop more efficient breathing techniques such as purse lipped breathing and diaphragmatic breathing and practicing self-pacing with activity.;Increase knowledge of respiratory medications and ability to use respiratory devices properly.;Stress;Improve shortness of breath with ADL's;Hypertension;Lipids;Diabetes Develop more efficient breathing techniques such as purse lipped breathing and diaphragmatic breathing and practicing self-pacing with activity.;Increase knowledge of respiratory medications and ability to use respiratory devices properly.;Stress;Improve shortness of breath with ADL's;Diabetes Develop more efficient breathing techniques such as purse lipped breathing and diaphragmatic breathing and practicing self-pacing with activity.;Increase knowledge of respiratory medications and ability to use respiratory devices properly.;Improve shortness of breath with ADL's    Review JamesChiokecompleted 1 exercise session.  Will continue to support pt with instruction on breathing techniques to improve shortness of breath.  Pt observed ambulating independently - reminder to use cane for stability Jahmad has completed 9 exercise sessions. Pt weight shows decrease of 2.1 kg.  Pt receptive to nutritional information provided by the RD.  pt  continues to have varying blood glucose readings typical post exercise run mid to upper 100's.  Pt with blood pressure readings within normal limits - will resolve this risk factor. [facility]. Per pt last lipid panel was good.  Pt is compliant with statin therapy and avoiding foods high in fat - will resolve this risk factor.  Will continue to work with pt on positive and healhty stress management techniques. Jaiyon has completed 9 exercise sessions. Pt weight shows decrease of 2.1 kg.  Pt receptive to nutritional information provided by the RD.  pt continues to have varying blood glucose readings typical post exercise run mid to upper 100's.  Pt with blood pressure readings within normal limits - will resolve this risk factor. [facility]. Per pt last lipid panel was good.  Pt is compliant with statin therapy and avoiding foods high in fat - will resolve this risk factor. 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  Level 3 on nustep and level 2 on the arm crank.  Will continue to work with pt on positive and healhty stress management techniques. Kartel will graduate in 2 weeks from the program and has improved his strength and stamina and is exercising on level 5 of the nustep and level 2.5 of the arm ergomenter.  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    Expected Outcomes See Admission Goals See Admission Goals -- For Curt to continue exercising at home after graduation from pulmonary rehab.           Exercise Goals and Review:  Exercise Goals    Row Name 01/10/20 1216             Exercise Goals   Increase Physical Activity Yes       Intervention Provide advice, education, support and counseling about physical activity/exercise needs.;Develop an individualized exercise prescription for aerobic and resistive training based on initial  evaluation findings, risk stratification, comorbidities and participant's personal goals.       Expected Outcomes Short Term: Attend rehab on a regular basis to increase amount of physical activity.;Long Term: Add in home exercise to make exercise part of routine and to increase amount of physical activity.;Long Term: Exercising regularly at least 3-5 days a week.       Increase Strength and Stamina Yes       Intervention Provide advice, education, support and counseling about physical activity/exercise needs.;Develop an individualized exercise prescription for aerobic and resistive training based on initial evaluation findings, risk stratification, comorbidities and participant's personal goals.       Expected Outcomes Short Term: Increase workloads from initial exercise prescription for resistance, speed, and METs.;Short Term: Perform resistance training exercises routinely during rehab and add in resistance training at home;Long Term: Improve cardiorespiratory fitness, muscular endurance and strength as measured by increased METs and functional capacity (6MWT)       Able to understand and use rate of perceived exertion (RPE) scale Yes       Intervention Provide education and explanation on how to use RPE scale  Expected Outcomes Short Term: Able to use RPE daily in rehab to express subjective intensity level;Long Term:  Able to use RPE to guide intensity level when exercising independently       Able to understand and use Dyspnea scale Yes       Intervention Provide education and explanation on how to use Dyspnea scale       Expected Outcomes Short Term: Able to use Dyspnea scale daily in rehab to express subjective sense of shortness of breath during exertion;Long Term: Able to use Dyspnea scale to guide intensity level when exercising independently       Knowledge and understanding of Target Heart Rate Range (THRR) Yes       Intervention Provide education and explanation of THRR including how  the numbers were predicted and where they are located for reference       Expected Outcomes Short Term: Able to state/look up THRR;Long Term: Able to use THRR to govern intensity when exercising independently;Short Term: Able to use daily as guideline for intensity in rehab       Understanding of Exercise Prescription Yes       Intervention Provide education, explanation, and written materials on patient's individual exercise prescription       Expected Outcomes Short Term: Able to explain program exercise prescription;Long Term: Able to explain home exercise prescription to exercise independently              Exercise Goals Re-Evaluation:  Exercise Goals Re-Evaluation    Row Name 01/15/20 1619 02/14/20 0845 03/06/20 0759         Exercise Goal Re-Evaluation   Exercise Goals Review Increase Physical Activity;Increase Strength and Stamina;Able to understand and use rate of perceived exertion (RPE) scale;Able to understand and use Dyspnea scale;Knowledge and understanding of Target Heart Rate Range (THRR);Understanding of Exercise Prescription Increase Physical Activity;Increase Strength and Stamina;Able to understand and use rate of perceived exertion (RPE) scale;Able to understand and use Dyspnea scale;Knowledge and understanding of Target Heart Rate Range (THRR);Understanding of Exercise Prescription Increase Physical Activity;Increase Strength and Stamina;Able to understand and use rate of perceived exertion (RPE) scale;Able to understand and use Dyspnea scale;Knowledge and understanding of Target Heart Rate Range (THRR);Understanding of Exercise Prescription     Comments Pt has completed 1 exercise session with rehab and tolerated it well with no complaints or concerns. He exercised at 1.4 METS on the Nustep and 1.5 METS on the arm ergometer. We will continue to monitor and progress as he is able Pt has completed 9 exercise sessions and has been a little slow to make progressions. He has knee  problems and comes in with pain most days, so we progress as he is able. He is exercising at 1.3 METS on the Nustep and 2 METS on the Arm Ergometer. Pt has completed 14 exercise sessions and has been slow to make progression with METS, although he is making progression with workloads. Pt gets distracted very easily on the exercise machines and struglles to maintain a consisten pace. He is exercising at 1.4 METS on the Nustep and 1.6 METS on the Arm Ergometer. Will continue to monitor and progress as he is able.     Expected Outcomes Through exercise at rehab and home the patient will decrease shortness of breath with daily activities and feel confident in carrying out an exercise regimn at home Through exercise at rehab and home the patient will decrease shortness of breath with daily activities and feel confident in carrying out an  exercise regimn at home Through exercise at rehab and home the patient will decrease shortness of breath with daily activities and feel confident in carrying out an exercise regimn at home            Nutrition & Weight - Outcomes:  Pre Biometrics - 01/10/20 1615      Pre Biometrics   Grip Strength 33 kg           Post Biometrics - 03/20/20 1434       Post  Biometrics   Grip Strength 27 kg           Nutrition:  Nutrition Therapy & Goals - 01/17/20 1424      Nutrition Therapy   Diet Carb modified/heart healthy    Drug/Food Interactions Coumadin/Vit K      Personal Nutrition Goals   Nutrition Goal Improved blood glucose control as evidenced by pt's A1c trending from 9.0 toward less than 7.0.    Personal Goal #2 CBG concentrations in the normal range or as close to normal as is safely possible.    Personal Goal #3 Pt to identify food quantities necessary to achieve weight loss of 6-24 lb at graduation from cardiac rehab.      Intervention Plan   Intervention Prescribe, educate and counsel regarding individualized specific dietary modifications aiming  towards targeted core components such as weight, hypertension, lipid management, diabetes, heart failure and other comorbidities.    Expected Outcomes Short Term Goal: A plan has been developed with personal nutrition goals set during dietitian appointment.;Long Term Goal: Adherence to prescribed nutrition plan.           Nutrition Discharge:  Nutrition Assessments - 04/07/20 1347      Rate Your Plate Scores   Post Score 48           Education Questionnaire Score:  Knowledge Questionnaire Score - 03/25/20 0936      Knowledge Questionnaire Score   Post Score 15/18           Goals reviewed with patient; copy given to patient.

## 2020-04-22 ENCOUNTER — Telehealth (HOSPITAL_COMMUNITY): Payer: Self-pay | Admitting: *Deleted

## 2020-06-10 ENCOUNTER — Encounter (HOSPITAL_COMMUNITY): Payer: Self-pay | Admitting: *Deleted

## 2020-06-10 NOTE — Progress Notes (Signed)
Pt.did not return either of my calls to a start Pulmonary Maintenance program. I will close his referral.

## 2020-11-07 ENCOUNTER — Ambulatory Visit (HOSPITAL_COMMUNITY)
Admission: EM | Admit: 2020-11-07 | Discharge: 2020-11-07 | Disposition: A | Discharge status: Home / Self Care | Payer: No Typology Code available for payment source | Attending: Emergency Medicine | Admitting: Emergency Medicine

## 2020-11-07 ENCOUNTER — Other Ambulatory Visit: Payer: Self-pay

## 2020-11-07 ENCOUNTER — Encounter (HOSPITAL_COMMUNITY): Payer: Self-pay

## 2020-11-07 DIAGNOSIS — R197 Diarrhea, unspecified: Secondary | ICD-10-CM | POA: Insufficient documentation

## 2020-11-07 DIAGNOSIS — Z1152 Encounter for screening for COVID-19: Secondary | ICD-10-CM | POA: Diagnosis present

## 2020-11-07 LAB — SARS CORONAVIRUS 2 (TAT 6-24 HRS): SARS Coronavirus 2: NEGATIVE

## 2020-11-07 NOTE — ED Triage Notes (Signed)
Pt presents with diarrhea X 1 day.  States he has been exposed to COVID and denies other sxs. States he is vaccinated.

## 2020-11-07 NOTE — Discharge Instructions (Addendum)
We will contact you if your COVID test is positive.  Please quarantine while you wait for the results.  If your test is negative you may resume normal activities.  If your test is positive please continue to quarantine for at least 5 days from your symptom onset or until you are without a fever for at least 24 hours after the medications.  Try a BRAT (bananas, rice, applesause, toast) or bland food diet for the next few days.  Stick with foods that are gentle on your stomach.  Avoid foods that are difficult to digest, such as diary.  As you feel better, you can start eating like you do normally.    You can use ginger (ginger ale, ginger candy) and mint for nausea.    Make sure you are drinking plenty of fluids, such as water, powerade/gatorade, pedialyte, juices, and teas.    Return or go to the Emergency Department if symptoms worsen or do not improve in the next few days.

## 2020-11-07 NOTE — ED Provider Notes (Signed)
[facility]    CSN: [id number] Arrival date & time: 11/07/20  1512      History   Chief Complaint Chief Complaint  Patient presents with   Diarrhea    HPI Adam Ponce is a 70 y.o. male.   Patient here for evaluation of diarrhea that has been ongoing for the past day after an exposure to COVID.  Patient does report having similar symptoms in the past associated with his medications.  Denies any fevers, cough, congestion.  Has not taken any OTC medications or treatments.  Denies any trauma, injury, or other precipitating event.  Denies any specific alleviating or aggravating factors.  Denies any fevers, chest pain, shortness of breath, N/V/D, numbness, tingling, weakness, abdominal pain, or headaches.     The history is provided by the patient.  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 Authorizing Provider  albuterol (PROVENTIL HFA;VENTOLIN HFA) 108 (90 Base) MCG/ACT inhaler Inhale into the lungs every 6 (six) hours as needed for wheezing or shortness of breath.    [provider]  betamethasone valerate (VALISONE) 0.1 % cream Apply topically 2 (two) times daily.    [provider]  cetirizine (ZYRTEC) 10  MG tablet Take 10 mg by mouth daily.    [provider]  Cholecalciferol (VITAMIN D3) 10000 units TABS Take by mouth.    [provider]  ciprofloxacin-dexamethasone (CIPRODEX) otic suspension 4 drops 2 (two) times daily.    [provider]  cyanocobalamin (,VITAMIN B-12,) 1000 MCG/ML injection Inject 1,000 mcg into the muscle once.    [provider]  darunavir (PREZISTA) 800 MG tablet Take 800 mg by mouth.    [provider]  docusate sodium (COLACE) 100 MG capsule Take 100 mg by mouth 2 (two) times daily.    [provider]  EMTRICITABINE-TENOFOVIR AF PO Take by mouth.    [provider]  fenofibrate (TRICOR) 145 MG tablet Take 145 mg by mouth daily.    [provider]  glipiZIDE (GLUCOTROL) 10 MG tablet Take 10 mg by mouth daily before breakfast.    [provider]  INSULIN GLARGINE [place] Inject into the skin.    [provider]  lisinopril (PRINIVIL,ZESTRIL) 40 MG tablet Take 40 mg by mouth daily.    [provider]  magnesium citrate SOLN Take 1 Bottle by mouth once.    [provider]  metFORMIN (GLUCOPHAGE) 1000 MG tablet Take 1,000 mg by mouth 2 (two) times daily with a meal.    [provider]  pravastatin (PRAVACHOL) 80 MG tablet Take 80 mg by mouth daily.  [provider]  pregabalin (LYRICA) 75 MG capsule Take 75 mg by mouth 2 (two) times daily.    [provider]  Psyllium 48.57 % POWD Take by mouth.    [provider]  ranitidine (ZANTAC) 150 MG capsule Take 150 mg by mouth 2 (two) times daily.    [provider]  ritonavir (NORVIR) 100 MG TABS tablet Take by mouth.    [provider]  sodium chloride (OCEAN) 0.65 % nasal spray Place 1 spray into the nose as needed for congestion.    [provider]  traMADol (ULTRAM) 50 MG tablet Take by mouth every 6 (six) hours as needed.    [provider]  warfarin  (COUMADIN) 5 MG tablet Take 5 mg by mouth daily.    [provider]    Family History History reviewed. No pertinent family history.  Social History Social History   Tobacco Use   Smoking status: Former    Packs/day: 1.00    Types: Cigarettes   Smokeless tobacco: Never  Substance Use Topics   Alcohol use: No    Comment: 2 pints a week   Drug use: No     Allergies   Ciprofloxacin, Ciprofloxacin-dexamethasone, and Codeine   Review of Systems Review of Systems  Gastrointestinal:  Positive for diarrhea.  All other systems reviewed and are negative.   Physical Exam Triage Vital Signs ED Triage Vitals  Enc Vitals Group     BP 11/07/20 1653 (!) 139/57     Pulse Rate 11/07/20 1653 (!) 56     Resp 11/07/20 1653 19     Temp 11/07/20 1653 98.4 F (36.9 C)     Temp Source 11/07/20 1653 Oral     SpO2 11/07/20 1653 98 %     Weight --      Height --      Head Circumference --      Peak Flow --      Pain Score 11/07/20 1651 0     Pain Loc --      Pain Edu? --      Excl. in GC? --    No data found.  Updated Vital Signs BP (!) 139/57 (BP Location: Right Arm)   Pulse (!) 56   Temp 98.4 F (36.9 C) (Oral)   Resp 19   SpO2 98%   Visual Acuity Right Eye Distance:   Left Eye Distance:   Bilateral Distance:    Right Eye Near:   Left Eye Near:    Bilateral Near:     Physical Exam Vitals and nursing note reviewed.  Constitutional:      General: He is not in acute distress.    Appearance: Normal appearance. He is not ill-appearing, toxic-appearing or diaphoretic.  HENT:     Head: Normocephalic and atraumatic.     Nose: Nose normal. No congestion or rhinorrhea.     Mouth/Throat:     Pharynx: No oropharyngeal exudate or posterior oropharyngeal erythema.  Eyes:     Conjunctiva/sclera: Conjunctivae normal.  Cardiovascular:     Rate and Rhythm: Normal rate and regular rhythm.     Pulses: Normal pulses.     Heart sounds: Normal heart sounds.  Pulmonary:      Effort: Pulmonary effort is normal.     Breath sounds: Normal breath sounds.  Abdominal:     General: Abdomen is flat.     Palpations: Abdomen is soft.  Musculoskeletal:  General: Normal range of motion.     Cervical back: Normal range of motion.  Skin:    General: Skin is warm and dry.  Neurological:     General: No focal deficit present.     Mental Status: He is alert and oriented to person, place, and time.  Psychiatric:        Mood and Affect: Mood normal.     UC Treatments / Results  Labs (all labs ordered are listed, but only abnormal results are displayed) Labs Reviewed  SARS CORONAVIRUS 2 (TAT 6-24 HRS)    EKG   Radiology No results found.  Procedures Procedures (including critical care time)  Medications Ordered in UC Medications - No data to display  Initial Impression / Assessment and Plan / UC Course  I have reviewed the triage vital signs and the nursing notes.  Pertinent labs & imaging results that were available during my care of the patient were reviewed by me and considered in my medical decision making (see chart for details).    Assessment negative for red flags or concerns.  COVID test pending.  Recommend quarantine while waiting on results and for at least 5 days from symptom onset if test is positive.  Diarrhea likely related to medications as patient reports similar symptoms in the past.  Encouraged fluids and rest.  May try a BRAT or bland food diet and then advance as tolerated. Follow up with primary care as needed.  Final Clinical Impressions(s) / UC Diagnoses   Final diagnoses:  Diarrhea, unspecified type  Encounter for screening for COVID-19     Discharge Instructions      We will contact you if your COVID test is positive.  Please quarantine while you wait for the results.  If your test is negative you may resume normal activities.  If your test is positive please continue to quarantine for at least 5 days from your  symptom onset or until you are without a fever for at least 24 hours after the medications.  Try a BRAT (bananas, rice, applesause, toast) or bland food diet for the next few days.  Stick with foods that are gentle on your stomach.  Avoid foods that are difficult to digest, such as diary.  As you feel better, you can start eating like you do normally.    You can use ginger (ginger ale, ginger candy) and mint for nausea.    Make sure you are drinking plenty of fluids, such as water, powerade/gatorade, pedialyte, juices, and teas.    Return or go to the Emergency Department if symptoms worsen or do not improve in the next few days.      ED Prescriptions   None    PDMP not reviewed this encounter.   Ivette Loyal, NP 11/07/20 1726

## 2021-09-15 ENCOUNTER — Telehealth: Payer: Self-pay | Admitting: Neurology

## 2021-09-15 NOTE — Telephone Encounter (Signed)
LVM and sent mychart msg informing pt of r/s needed for 6/29 appt- MD out. 

## 2021-10-15 ENCOUNTER — Institutional Professional Consult (permissible substitution): Payer: No Typology Code available for payment source | Admitting: Neurology

## 2021-10-27 ENCOUNTER — Encounter: Payer: Self-pay | Admitting: *Deleted

## 2021-10-28 ENCOUNTER — Ambulatory Visit (INDEPENDENT_AMBULATORY_CARE_PROVIDER_SITE_OTHER): Payer: No Typology Code available for payment source | Admitting: Neurology

## 2021-10-28 ENCOUNTER — Encounter: Payer: Self-pay | Admitting: Neurology

## 2021-10-28 VITALS — BP 151/71 | HR 62 | Ht 71.0 in | Wt 278.0 lb

## 2021-10-28 DIAGNOSIS — G478 Other sleep disorders: Secondary | ICD-10-CM | POA: Diagnosis not present

## 2021-10-28 DIAGNOSIS — E669 Obesity, unspecified: Secondary | ICD-10-CM

## 2021-10-28 DIAGNOSIS — G4719 Other hypersomnia: Secondary | ICD-10-CM | POA: Diagnosis not present

## 2021-10-28 DIAGNOSIS — G4733 Obstructive sleep apnea (adult) (pediatric): Secondary | ICD-10-CM | POA: Diagnosis not present

## 2021-10-28 NOTE — Patient Instructions (Addendum)
Thank you for choosing [facility] for your sleep related care! It was nice to meet you today!   Here is what we discussed today:    Based on your symptoms and your exam I believe you are at risk for obstructive sleep apnea (aka OSA). We should proceed with a sleep study to determine whether you do or do not have OSA and how severe it is. Even, if you have mild OSA, I may want you to consider treatment with CPAP, as treatment of even borderline or mild sleep apnea can result and improvement of symptoms such as sleep disruption, daytime sleepiness, nighttime bathroom breaks, restless leg symptoms, improvement of headache syndromes, even improved mood disorder.  Unfortunately, I do not believe you are a good candidate for inspire which is a surgically placed device, it requires for you to adjust your treatments over time, it does require surgery and anesthesia and given that you have had some memory issues and difficulty with healing, recent toe amputations, and obesity, you are currently not considered a good candidate for this.  We will consider CPAP therapy again after sleep testing.  As explained, an attended sleep study (meaning you get to stay overnight in the sleep lab), lets [place] monitor sleep-related behaviors such as sleep talking and leg movements in sleep, in addition to monitoring for sleep apnea.  A home sleep test is a screening tool for sleep apnea diagnosis only, but unfortunately, does not help with any other sleep-related diagnoses.  Please remember, the long-term risks and ramifications of untreated moderate to severe obstructive sleep apnea may include (but are not limited to): increased risk for cardiovascular disease, including congestive heart failure, stroke, difficult to control hypertension, treatment resistant obesity, arrhythmias, especially irregular heartbeat commonly known as A. Fib. (atrial fibrillation); even type 2 diabetes has been linked to untreated OSA.    Other correlations that untreated obstructive sleep apnea include macular edema which is swelling of the retina in the eyes, droopy eyelid syndrome, and elevated hemoglobin and hematocrit levels (often referred to as polycythemia).  Sleep apnea can cause disruption of sleep and sleep deprivation in most cases, which, in turn, can cause recurrent headaches, problems with memory, mood, concentration, focus, and vigilance. Most people with untreated sleep apnea report excessive daytime sleepiness, which can affect their ability to drive. Please do not drive or use heavy equipment or machinery, if you feel sleepy! Patients with sleep apnea can also develop difficulty initiating and maintaining sleep (aka insomnia).   Having sleep apnea may increase your risk for other sleep disorders, including involuntary behaviors sleep such as sleep terrors, sleep talking, sleepwalking.    Having sleep apnea can also increase your risk for restless leg syndrome and leg movements at night.   Please note that untreated obstructive sleep apnea may carry additional perioperative morbidity. Patients with significant obstructive sleep apnea (typically, in the moderate to severe degree) should receive, if possible, perioperative PAP (positive airway pressure) therapy and the surgeons and particularly the anesthesiologists should be informed of the diagnosis and the severity of the sleep disordered breathing.   We will call you or email you through MyChart with regards to your test results and plan a follow-up in sleep clinic accordingly. Most likely, you will hear from one of our nurses.   Our sleep lab administrative assistant will call you to schedule your sleep study and give you further instructions, regarding the check in process for the sleep study, arrival time, what to bring, when you  can expect to leave after the study, etc., and to answer any other logistical questions you may have. If you don't hear back from her  by about 2 weeks from now, please feel free to call her direct line at [phone] or you can call our general clinic number, or email [place] through My Chart.

## 2021-10-28 NOTE — Progress Notes (Signed)
Subjective:    Patient ID: Adam Ponce is a 71 y.o. male.  HPI    Adam Foley, MD, PhD [facility] [address] [address] [place], [place] [address]  I saw Mr. Adam Ponce in my sleep clinic today for initial consultation of his sleep disorder, in particular, evaluation of his prior diagnosis of obstructive sleep apnea.  The patient is unaccompanied today and referred by the [place]. I am not sure as to the provider who referred him.  He is interested in talking about inspire as he saw an ad on TV for it.  Adam Ponce is a 71 year old right-handed gentleman with an underlying medical history of hypertension, diabetes, asthma, arthritis, HIV disease, history of aseptic necrosis of the femur, cellulitis of the right lower extremity, chronic kidney disease, and obesity, who was previously diagnosed with obstructive sleep apnea and placed on PAP therapy. Prior sleep study results are not available for my review today.  [facility], no sleep study reports in the [place] records.  He reports that sleep testing was 3 to 4 years ago and his machine was used for about a week, he felt that he could not keep the mask on as he tosses and turns.  He still has the machine but has not used it for 3 to 4 years, he believes sleep testing was maybe 5 years ago or over 5 years ago even.  He is unable to provide any details.  He lives with his roommate, goes to bed around 11 and rise time is around 3.  His roommate has to wake up early for dialysis and patient wakes up with him.  He takes a nap throughout the day, unclear how long.  He does not drink caffeine daily but does drink some diet sodas, no alcohol currently, quit smoking some 15 or 20 years ago.  He does endorse being forgetful and having trouble remembering information.  He is followed by infectious diseases through the [place].  He reports no longer taking Coumadin but [facility] still [facility] on [facility] list.  He  reports a history of atrial fibrillation and having had surgery for this, possibly an ablation.  His Epworth sleepiness score is 7 out of 24, fatigue severity score is 55 out of 63.  He is supposed to have knee replacements done but he reports that they will not do it until he loses weight.  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    His Past Medical History Is Significant For: Past Medical History:  Diagnosis Date   Arthritis    Asthma    Constipation    Diabetes mellitus    Heart disease    HIV disease (HCC) 04/19/1993   Hypertension     His Past Surgical History Is Significant For: Past Surgical History:  Procedure Laterality Date   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   JOINT REPLACEMENT  2010   Bilateral hip    His Family History Is Significant For: Family History  Problem Relation Age of Onset   Heart attack Father     His Social History Is Significant For: Social History   Socioeconomic History   Marital status: Single    Spouse name: Not on file   Number of children: Not on file   Years of education: Not on file   Highest education level: 12th grade  Occupational  History   Not on file  Tobacco Use   Smoking status: Former    Packs/day: 1.00    Types: Cigarettes   Smokeless tobacco: Never  Substance and Sexual Activity   Alcohol use: No    Comment: 2 pints a week quit 2001   Drug use: No   Sexual activity: Not on file  Other Topics Concern   Not on file  Social History Narrative   No caffeine use   Lives at home with partner    Social Determinants of Health   Financial Resource Strain: Not on file  Food Insecurity: Not on file  Transportation Needs: Not on file  Physical Activity: Not on file  Stress: Not on file  Social Connections: Not on file    His Allergies Are:  Allergies  Allergen Reactions   Ciprofloxacin Other (See Comments)    unknown   Ciprofloxacin-Dexamethasone Other (See  Comments)    unknown   Codeine   :   His Current Medications Are:  Outpatient Encounter Medications as of 10/28/2021  Medication Sig   albuterol (PROVENTIL HFA;VENTOLIN HFA) 108 (90 Base) MCG/ACT inhaler Inhale into the lungs every 6 (six) hours as needed for wheezing or shortness of breath.   Azelastine HCl 137 MCG/SPRAY SOLN Place into the nose.   bacitracin 500 UNIT/GM ointment Apply 1 Application topically 2 (two) times daily. Small amount   benzonatate (TESSALON) 100 MG capsule Take 100 mg by mouth every 8 (eight) hours as needed for cough.   betamethasone valerate (VALISONE) 0.1 % cream Apply topically 2 (two) times daily.   cetirizine (ZYRTEC) 10 MG tablet Take 10 mg by mouth daily.   Cholecalciferol (VITAMIN D3) 10000 units TABS Take by mouth.   ciprofloxacin-dexamethasone (CIPRODEX) otic suspension 4 drops 2 (two) times daily.   cyanocobalamin (,VITAMIN B-12,) 1000 MCG/ML injection Inject 1,000 mcg into the muscle once.   darunavir (PREZISTA) 800 MG tablet Take 800 mg by mouth.   docusate sodium (COLACE) 100 MG capsule Take 100 mg by mouth 2 (two) times daily.   EMTRICITABINE-TENOFOVIR AF PO Take by mouth.   ezetimibe (ZETIA) 10 MG tablet Take 10 mg by mouth daily.   famotidine (PEPCID) 20 MG tablet Take 20 mg by mouth 2 (two) times daily.   fenofibrate (TRICOR) 145 MG tablet Take 145 mg by mouth daily.   glipiZIDE (GLUCOTROL) 10 MG tablet Take 10 mg by mouth daily before breakfast.   guaiFENesin (MUCINEX) 600 MG 12 hr tablet Take 600 mg by mouth every 12 (twelve) hours as needed for cough (congestion).   hydrocerin (EUCERIN) CREA Apply 1 Application topically as needed.   INSULIN GLARGINE [place] Inject into the skin.   ipratropium (ATROVENT) 0.06 % nasal spray Place 2 sprays into both nostrils 3 (three) times daily as needed for rhinitis.   lisinopril (PRINIVIL,ZESTRIL) 40 MG tablet Take 40 mg by mouth daily.   metFORMIN (GLUCOPHAGE) 1000 MG tablet Take 1,000 mg by mouth 2 (two)  times daily with a meal.   pravastatin (PRAVACHOL) 80 MG tablet Take 80 mg by mouth daily.   pregabalin (LYRICA) 100 MG capsule Take 100 mg by mouth 2 (two) times daily.   ritonavir (NORVIR) 100 MG TABS tablet Take by mouth.   SEMAGLUTIDE, 1 MG/DOSE, [place] Inject 1 mg into the skin once a week.   sennosides-docusate sodium (SENOKOT-S) 8.6-50 MG tablet Take 1 tablet by mouth daily.   sodium chloride (OCEAN) 0.65 % nasal spray Place 1 spray into the nose 4 (  four) times daily as needed for congestion.   traMADol (ULTRAM) 50 MG tablet Take by mouth every 6 (six) hours as needed.   triamcinolone cream (KENALOG) 0.1 % Apply 1 Application topically daily as needed (itching on ears).   VALACYCLOVIR HCL PO Take 1 g by mouth in the morning and at bedtime. As needed for 5 days (for outbreaks)   warfarin (COUMADIN) 5 MG tablet Take 7.5 mg by mouth daily.   No facility-administered encounter medications on file as of 10/28/2021.  :   Review of Systems:  Out of a complete 14 point review of systems, all are reviewed and negative with the exception of these symptoms as listed below:  Review of Systems  Neurological:        Here for sleep consult. Pt reports prior sleep study and CPAP therapy. Reports he does snore.   Reports he stopped CPAP therapy due to movement at night and not being able to keep the mask on. Had seen commercials for inspire and would like to discuss.    Objective:  Neurological Exam  Physical Exam Physical Examination:   Vitals:   10/28/21 1018  BP: (!) 151/71  Pulse: 62    General Examination: The patient is a very pleasant 71 y.o. male in no acute distress. He appears well-developed and well-nourished and adequately groomed.   HEENT: Normocephalic, atraumatic, pupils are equal, round and reactive to light, extraocular tracking is good without limitation to gaze excursion or nystagmus noted. Hearing is grossly intact. Face is symmetric with normal facial animation. Speech  is clear with no dysarthria noted. There is no hypophonia. There is no lip, neck/head, jaw or voice tremor. Neck is supple with full range of passive and active motion. There are no carotid bruits on auscultation. Oropharynx exam reveals: moderate mouth dryness, marginal dental hygiene with nearly edentulous state on top, marked airway crowding secondary to redundant soft palate and wider uvula, wider tongue, tonsils not fully visualized, Mallampati class IV.  Slight underbite.   Chest: Clear to auscultation without wheezing, rhonchi or crackles noted.  Heart: S1+S2+0, regular and normal without murmurs, rubs or gallops noted.   Abdomen: Soft, non-tender and non-distended.  Extremities: There is no significant pitting edema.   Skin: Warm and dry without trophic changes noted.   Musculoskeletal: exam reveals status post amputation of toes on the right foot.   Neurologically:  Mental status: The patient is awake, alert and oriented in all 4 spheres.  His memory seems impaired, he is unable to provide any details of his sleep related history, prior testing, or details of his medical history.    Cranial nerves II - XII are as described above under HEENT exam.  Motor exam: Normal bulk, strength and tone is noted. There is no obvious tremor, fine motor skills are mildly impaired globally.  Cerebellar testing: No dysmetria or intention tremor. There is no truncal or gait ataxia.  Sensory exam: intact to light touch in the upper and lower extremities.               Assessment and Plan:  In summary, Adam Ponce is a very pleasant 71 y.o.-year old male with an underlying medical history of hypertension, diabetes, asthma, arthritis, HIV disease, history of aseptic necrosis of the femur, cellulitis of the right lower extremity, chronic kidney disease, history of A-fib, previously on Coumadin and obesity, who presents for evaluation of his obstructive sleep apnea.  He was diagnosed with sleep apnea  several years ago,  maybe 5+ years ago, unclear of severity.  He tried CPAP for 4 weeks as per his recollection and has not used her machine in 3 to 4 years, maybe longer.  We talked about the importance of treating obstructive sleep apnea, I advised him to proceed with reevaluation with a sleep study.  We talked about treatment options, I do not believe he is a great candidate for inspire, I explained that this is a surgical procedure, it requires quite a bit of self-directed learning and adjustment of the stimulation level.  He agrees that he is not sure that he would be able to do this, in addition, he does have a history of for diabetes control and recently had toe amputations, requires knee surgery but needs to lose weight.  Inspire candidacy also requires some weight loss although the FDA has approved it for higher weight and the higher sleep apnea severity.  I do not consider him a good candidate for inspire therapy.  Nevertheless, I would like to offer him treatment for sleep apnea if he is a candidate for PAP therapy.  We will proceed with sleep testing and consider treatment with a CPAP or AutoPap machine accordingly.  We will plan a follow-up after testing.  I answered all his questions today and he was in agreement with our plan.  Of note, he will not be a candidate for a dental device at this time.  He is encouraged to continue to work on weight loss and maintaining good sleep hygiene.  Current, very consistent sleep schedule.  Adam Foley, MD, PhD

## 2021-11-09 ENCOUNTER — Telehealth: Payer: Self-pay

## 2021-11-09 NOTE — Telephone Encounter (Signed)
LVM for pt to call me back to schedule sleep study  

## 2021-11-24 NOTE — Telephone Encounter (Signed)
[facility]- [place] [facility]: [id number] (exp. 08/12/21 to 02/08/22).   Patient is scheduled at [facility] for 12/14/21 at 9 pm.  Mailed packet to the patient.

## 2021-11-24 NOTE — Telephone Encounter (Signed)
LVM for pt to call back to schedule sleep study.  

## 2021-12-14 ENCOUNTER — Ambulatory Visit (INDEPENDENT_AMBULATORY_CARE_PROVIDER_SITE_OTHER): Payer: No Typology Code available for payment source | Admitting: Neurology

## 2021-12-14 DIAGNOSIS — G4761 Periodic limb movement disorder: Secondary | ICD-10-CM

## 2021-12-14 DIAGNOSIS — G4720 Circadian rhythm sleep disorder, unspecified type: Secondary | ICD-10-CM

## 2021-12-14 DIAGNOSIS — G4733 Obstructive sleep apnea (adult) (pediatric): Secondary | ICD-10-CM | POA: Diagnosis not present

## 2021-12-14 DIAGNOSIS — E669 Obesity, unspecified: Secondary | ICD-10-CM

## 2021-12-14 DIAGNOSIS — G4719 Other hypersomnia: Secondary | ICD-10-CM

## 2021-12-14 DIAGNOSIS — G478 Other sleep disorders: Secondary | ICD-10-CM

## 2021-12-23 NOTE — Procedures (Signed)
[facility] POLYSOMNOGRAPHY  INTERPRETATION REPORT   STUDY DATE:  12/14/2021     PATIENT NAME:  .Adam Ponce         DATE OF BIRTH:  March 15, 1951  PATIENT ID:  [id number]    TYPE OF STUDY:  PSG  READING PHYSICIAN: Huston Foley, MD, PhD REFERRED BY: [facility] SCORING TECHNICIAN: [place], RPSGT   HISTORY:  71 year old right-handed gentleman with an underlying medical history of hypertension, diabetes, asthma, arthritis, HIV disease, history of aseptic necrosis of the femur, cellulitis of the right lower extremity, chronic kidney disease, and obesity, who was previously diagnosed with obstructive sleep apnea and placed on PAP therapy. Prior sleep study results are not available for my review today. ?[facility], [facility] reports in the [place] records. ?He reports that sleep testing was 3 to 4 years ago and his machine was used for about a week, he felt that he could not keep the mask on as he tosses and turns. The Epworth Sleepiness Score is 7/24.   Height: 71 in Weight: 278 lb (BMI 38) Neck Size: 0 in  MEDICATIONS: Albuterol, Azelastine, Bacitracin, Valisone, Zytrec, Vitamin D3, Ciprodex, Vitamin B12, Prezista, Colace, Emtricitabine-Tenofovir AF, Zetia, Pepcid, Tricor, Glucotrol, Mucinex, Insulin Glargine, Atrovent, Lisinopril, Glucophage, Pravachol, Lyrica, Norvir, Ocean, Semaglutide, Senokot-S, Eucerin, Ultram, Kenalog, Valacyclovir HCL, Coumadin TECHNICAL DESCRIPTION: A registered sleep technologist ( RPSGT)  was in attendance for the duration of the recording.  Data collection, scoring, video monitoring, and reporting were performed in compliance with the AASM Manual for the Scoring of Sleep and Associated Events; (Hypopnea is scored based on the criteria listed in Section VIII D. 1b in the AASM Manual V2.6 using a 4% oxygen desaturation rule or Hypopnea is scored based on the criteria listed in Section VIII D. 1a in the AASM Manual V2.6 using 3% oxygen  desaturation and /or arousal rule).   SLEEP CONTINUITY AND SLEEP ARCHITECTURE:  Lights-out was at 22:01: and lights-on at  05:07:, with  7 h and 6.5 minutes of recording time . Total sleep time ( TST) was 202.0 minutes with a decreased sleep efficiency at 47.4%.  BODY POSITION:  TST was divided  between the following sleep positions: 48.0% supine;  51.7% lateral;  0% prone. Duration of total sleep and percent of total sleep in their respective position is as follows: supine 97 minutes (48%), non-supine 105 minutes (52%); right 35 minutes (17%), left 69 minutes (34%), and prone 00 minutes (0%).  Total supine REM sleep time was 21 minutes (66% of total REM sleep). Sleep latency was increased at 106.5 minutes.  REM sleep latency was mildly reduced at 56.5 minutes. Of the total sleep time, the percentage of stage N1 sleep was 3.2%, stage N2 sleep was 81%, which is markedly increased, stage N3 sleep was absent, and REM sleep was 16.1%, which is reduced. There were 2 Stage R periods observed on this study night, 14 awakenings (i.e. transitions to Stage W from any sleep stage), and 38 total stage transitions. Wake after sleep onset (WASO) time accounted for 118 minutes with moderate sleep fragmentation noted.   RESPIRATORY MONITORING:  Based on CMS criteria (using a 4% oxygen desaturation rule for scoring hypopneas), there were 10 apneas (1 obstructive; 9 central; 0 mixed), and 44 hypopneas.  Apnea index was 3.0. Hypopnea index was 13.1. The apnea-hypopnea index was 16.0/hour overall (17.3/hour in supine sleep, 33 non-supine; 27.7/hour in REM sleep, 25.1 supine REM).  There were 0 respiratory effort-related  arousals (RERAs).  The RERA index was 0 events/h. Total respiratory disturbance index (RDI) was 16.0 events/h. RDI results showed: supine RDI  17.3 /h; non-supine RDI 14.9 /h; REM RDI 27.7 /h, supine REM RDI 25.1 /h.   Based on AASM criteria (using a 3% oxygen desaturation and /or arousal rule for scoring  hypopneas), there were 10 apneas (1 obstructive; 9 central; 0 mixed), and 44 hypopneas. Apnea index was 3.0. Hypopnea index was 13.1. The apnea-hypopnea index was 16.0 overall (17.3 supine, 33 non-supine; 27.7 REM, 25.1 supine REM).  There were 0 respiratory effort-related arousals (RERAs).  The RERA index was 0 events/h. Total respiratory disturbance index (RDI) was 16.0 events/h. RDI results showed: supine RDI  17.3 /h; non-supine RDI 14.9 /h; REM RDI 27.7 /h, supine REM RDI 25.1 /h.  OXIMETRY: Oxyhemoglobin Saturation Nadir during sleep was at  78%) from a mean of 94%.  Of the Total sleep time (TST)   hypoxemia (=<88%) was present for  4.6 minutes, or 2.3% of total sleep time.  LIMB MOVEMENTS: There were 152 periodic limb movements of sleep (45.1/hr), of which 0 (0.0/hr) were associated with an arousal. AROUSAL: There were 14 arousals in total, for an arousal index of 4 arousals/hour.  Of these, 13 were identified as respiratory-related arousals (4 /h), 0 were PLM-related arousals (0 /h), and 11 were non-specific arousals (3 /h). There were 0 occurrences of Cheyne Stokes breathing.  Snoring was classified as mild. EEG:  EEG was of normal amplitude and frequency, with symmetric manifestation of sleep stages. EKG: The electrocardiogram showed normal sinus rhythm (NSR).  The average heart rate during sleep was 50 bpm.  The heart rate during sleep varied between a minimum of Bradycardia and  a maximum of  62 bpm. AUDIO and VIDEO:  The video and audio analysis did not show any abnormal or unusual behaviors, movements, phonations or vocalizations. The patient took 2 bathroom breaks. Post study, the patient indicated, that sleep was worse than usual. IMPRESSION: 1. Obstructive Sleep Apnea  2. Dysfunctions associated with sleep stages or arousal from sleep 3. PLMD (periodic limb movement disorder)  RECOMMENDATIONS: 1. This study demonstrates moderate obstructive sleep apnea, with a total AHI of 16/hour,  REM AHI of 27.7/hour, supine AHI of 17.3/hour and O2 nadir of 78%. Treatment with positive airway pressure in the form of CPAP is recommended. This will require a full night titration study to optimize therapy settings, mask fit, monitoring of tolerance and of proper oxygen saturations. Other treatment options may be limited, and may include (generally speaking) surgical options in selected patients or the use of an oral appliance in certain patients. Concomitant weight loss is recommended. Please note that untreated obstructive sleep apnea may carry additional perioperative morbidity. Patients with significant obstructive sleep apnea should receive perioperative PAP therapy and the surgeons and particularly the anesthesiologist should be informed of the diagnosis and the severity of the sleep disordered breathing. 2. This study shows sleep fragmentation and abnormal sleep stage percentages; these are nonspecific findings and per se do not signify an intrinsic sleep disorder or a cause for the patient's sleep-related symptoms. Causes include (but are not limited to) the first night effect of the sleep study, circadian rhythm disturbances, medication effect or an underlying mood disorder or medical problem.  3. The patient should be cautioned not to drive, work at heights, or operate dangerous or heavy equipment when tired or sleepy. Review and reiteration of good sleep hygiene measures should be pursued with any patient. 4.  Moderate PLMs (periodic limb movements of sleep) were noted during this study with no significant arousals; clinical correlation is recommended.  5. The patient will be seen in follow-up in the sleep clinic at [facility] for discussion of the test results, symptom and treatment compliance review, further management strategies, etc. The patient and the referring provider will be notified of the test results.  I certify that I have reviewed the entire raw data recording prior to the issuance of this  report in accordance with the Standards of Accreditation of the American Academy of Sleep Medicine (AASM).    Huston Foley,  MD, PhD

## 2021-12-23 NOTE — Addendum Note (Signed)
Addended by: Huston Foley on: 12/23/2021 05:59 PM   Modules accepted: Orders

## 2021-12-24 ENCOUNTER — Telehealth: Payer: Self-pay | Admitting: *Deleted

## 2021-12-24 NOTE — Telephone Encounter (Signed)
Called pt & LVM asking for call back. Also sent mychart message. When the patient calls back, please let him know that his recent sleep study showed moderate obstructive sleep apnea. Dr Frances Furbish recommends treatment for this in the form of CPAP. This will require a repeat sleep study for proper titration and mask fitting and correct monitoring of the oxygen saturations. We need to put him on CPAP and make sure we have him on the best settings to treat his sleep apnea. Our sleep lab will obtain insurance authorization and will then call him to schedule.   Let us know if he has any questions.

## 2021-12-24 NOTE — Telephone Encounter (Signed)
-----   Message from Huston Foley, MD sent at 12/23/2021  5:59 PM EDT ----- Patient referred by the [facility] for re-eval of his OSA, seen by me on 10/28/21, diagnostic PSG on 12/14/21.   Please call and notify the patient that the recent sleep study showed moderate obstructive sleep apnea. I recommend treatment for this in the form of CPAP. This will require a repeat sleep study for proper titration and mask fitting and correct monitoring of the oxygen saturations. Please explain to patient. I have placed an order in the chart. Thanks.  Huston Foley, MD, PhD [facility])

## 2021-12-29 NOTE — Telephone Encounter (Signed)
The patient returned my call. We discussed his sleep study results, recommendations, and proposed plan. The patient stated he has trouble tolerating [facility] and he is hesitant but he will go forward with the plan to do a CPAP titration study. His questions were answered. He will f/u in one week if he has not received a call to schedule. He verbalized appreciation.

## 2021-12-29 NOTE — Telephone Encounter (Signed)
Patient left a voicemail for a call back to go over his sleep results.

## 2021-12-29 NOTE — Telephone Encounter (Signed)
Called pt back & LVM asking for call back.

## 2021-12-30 ENCOUNTER — Telehealth: Payer: Self-pay | Admitting: Neurology

## 2021-12-30 NOTE — Telephone Encounter (Signed)
CPAP Titration- [place] Berkley Harvey: [id number] (exp. 08/12/21 to 02/08/22) & [facility] Berkley Harvey: [id number] (exp. 12/30/21 to 03/30/22).  Patient is scheduled at [facility] for 01/19/22 at 9 pm.  Mailed packet to the patient.

## 2022-01-19 ENCOUNTER — Ambulatory Visit (INDEPENDENT_AMBULATORY_CARE_PROVIDER_SITE_OTHER): Payer: No Typology Code available for payment source | Admitting: Neurology

## 2022-01-19 DIAGNOSIS — G4720 Circadian rhythm sleep disorder, unspecified type: Secondary | ICD-10-CM

## 2022-01-19 DIAGNOSIS — G4733 Obstructive sleep apnea (adult) (pediatric): Secondary | ICD-10-CM

## 2022-01-19 DIAGNOSIS — G4761 Periodic limb movement disorder: Secondary | ICD-10-CM

## 2022-01-19 DIAGNOSIS — E669 Obesity, unspecified: Secondary | ICD-10-CM

## 2022-01-19 DIAGNOSIS — G4719 Other hypersomnia: Secondary | ICD-10-CM

## 2022-01-29 NOTE — Addendum Note (Signed)
Addended by: Star Age on: 01/29/2022 12:30 PM   Modules accepted: Orders

## 2022-01-29 NOTE — Procedures (Signed)
Physician Interpretation:     [facility] PAP TITRATION INTERPRETATION REPORT   STUDY DATE: 01/19/2022      PATIENT NAME:  .Adam Ponce         DATE OF BIRTH:  1950/06/09  PATIENT ID:  [id number]    TYPE OF STUDY:  CPAP  READING PHYSICIAN: Huston Foley, MD, PhD SCORING TECHNICIAN: Harvest [place], RPSGT   HISTORY: 71 year old male with a history of hypertension, diabetes, asthma, arthritis, HIV disease, history of aseptic necrosis of the femur, cellulitis of the right lower extremity, chronic kidney disease, and obesity, who presents for a full night titration to treat his obstructive sleep apnea. His baseline sleep study from 12/14/2021 showed moderate obstructive sleep apnea, with a total AHI of 16/hour, REM AHI of 27.7/hour, supine AHI of 17.3/hour and O2 nadir of 78%. ADDITIONAL INFORMATION:  Height: 71.0 in Weight: 278 lb (BMI 38) Neck Size: 0.0 in    MEDICATIONS: Albuterol, Azelastine, Bacitracin, Valisone, Zytrec, Vitamin D3, Ciprodex, Vitamin B12, Prezista, Colace, Emtricitabine-Tenofovir AF, Zetia, Pepcid, Tricor, Glucotrol, Mucinex, Insulin Glargine, Atrovent, Lisinopril, Glucophage, Pravachol, Lyrica, Norvir, Ocean, Semaglutide, Senokot-S, Eucerin, Ultram, Kenalog, Valacyclovir HCL, Coumadin DESCRIPTION: A sleep technologist was in attendance for the duration of the recording.  Data collection, scoring, video monitoring, and reporting were performed in compliance with the AASM Manual for the Scoring of Sleep and Associated Events; (Hypopnea is scored based on the criteria listed in Section VIII D. 1b in the AASM Manual V2.6 using a 4% oxygen desaturation rule or Hypopnea is scored based on the criteria listed in Section VIII D. 1a in the AASM Manual V2.6 using 3% oxygen desaturation and /or arousal rule).  A physician certified by the American Board of Sleep Medicine reviewed each epoch of the study.  SLEEP CONTINUITY AND SLEEP ARCHITECTURE:  Lights off was  at 21:41: and lights on 05:05: (7.4 hours in bed). Total sleep time was 270.0 minutes (43.3% supine;  56.7% lateral;  0.0% prone, 21.7% REM sleep), with a decreased sleep efficiency at 60.9%. Sleep latency was increased at 56.5 minutes. REM latency markedly delayed at 200 min. Of the total sleep time, the percentage of stage N1 sleep was 5.0%, stage N2 sleep was 73.3%, which is increased, stage N3 sleep was absent, and REM sleep was 21.7%, which is normal. Wake after sleep onset (WASO) time accounted for 116 minutes with mild to moderate sleep fragmentation noted and several longer periods of wakefulness.  AROUSAL: There were 3 arousals in total, for an arousal index of 0.7 arousals/hour.  Of these, 4 were identified as respiratory-related arousals (0.9 /h), 0 were PLM-related arousals (0.0 /h), and 12 were non-specific arousals (2.7 /h)  RESPIRATORY MONITORING:  Based on CMS criteria (using a 4% oxygen desaturation rule for scoring hypopneas), there were 9 apneas (6 obstructive; 3 central; 0 mixed), and 74 hypopneas.  Apnea index was 2.0. Hypopnea index was 16.4. The apnea-hypopnea index was 18.4 overall (9.7 supine, 8.9 non-supine; 7.2 REM, 3.3 supine REM). There were 0 respiratory effort-related arousals (RERAs).  The RERA index was 0.0 events/h. Total respiratory disturbance index (RDI) was 18.4 events/h. RDI results showed: supine RDI  9.7 /h; non-supine RDI 25.1 /h; REM RDI 7.2 /h, supine REM RDI 3.3 /h.   Based on AASM criteria (using a 3% oxygen desaturation and /or arousal rule for scoring hypopneas), there were 9 apneas (6 obstructive; 3 central; 0 mixed), and 76 hypopneas. Apnea index was 2.0. Hypopnea index was 16.9. The apnea-hypopnea index was  18.9 overall (9.7 supine, 8.9 non-supine; 7.2 REM, 3.3 supine REM). There were 0 respiratory effort-related arousals (RERAs).  The RERA index was 0.0 events/h. Total respiratory disturbance index (RDI) was 18.9 events/h. RDI results showed: supine RDI  9.7  /h; non-supine RDI 25.9 /h; REM RDI 7.2 /h, supine REM RDI 3.3 /h.  Respiratory events were associated with oxyhemoglobin desaturations (nadir during sleep 87%) from a mean of 96%).  OXIMETRY: Total sleep time spent at, or below 88% was 0.1 minutes, or 0.0% of total sleep time.  BODY POSITION: Duration of total sleep and percent of total sleep in their respective position is as follows: supine 117 minutes (43.3%), non-supine 153.0 minutes (56.7%); right 30 minutes (11.1%), left 123 minutes (45.6%), and prone 00 minutes (0.0%). Total supine REM sleep time was 18 minutes (30.8% of total REM sleep). LIMB MOVEMENTS: There were 64 periodic limb movements of sleep (14.2/h), of which 0 (0.0/h) were associated with an arousal. TITRATION DETAILS (SEE ALSO TABLE AT THE END OF THE REPORT):  The patient was shown several different interfaces and was subsequently fitted with a medium Simplus fullface mask due to oral venting. The patient was started on a pressure of 5 cm of water pressure and gradually titrated to a final titration pressure of 13 cm. The AHI was improved and optimized at a pressure of 12 cm, on which the patient achieved a total sleep time of 107 minutes. Residual AHI was 3.93/hour, O2 nadir of 90%, with supine REM sleep achieved.  The video and audio analysis did not show any abnormal or unusual behaviors, movements, phonations or vocalizations. Post study, the patient indicated, that sleep was the same as usual. IMPRESSION: 1.  Obstructive Sleep Apnea  2. Dysfunctions associated with sleep stages or arousal from sleep RECOMMENDATIONS: 1.?This study demonstrates resolution of the patient's obstructive sleep apnea with CPAP therapy. I will, therefore, start the patient on home CPAP treatment at a pressure of 12 cm via medium fullface mask with heated humidity. The patient should be reminded to be fully compliant with PAP therapy to improve sleep related symptoms and decrease long term cardiovascular  risks. The patient should be reminded, that it may take up to 3 months to get fully used to using PAP with all planned sleep. The earlier full compliance is achieved, the better long term compliance tends to be. Please note that untreated obstructive sleep apnea carries additional perioperative morbidity. Patients with significant obstructive sleep apnea should receive perioperative PAP therapy and the surgeons and particularly the anesthesiologist should be informed of the diagnosis and the severity of the sleep disordered breathing. 2.?This study shows sleep fragmentation and abnormal sleep stage percentages; these are nonspecific findings and per se do not signify an intrinsic sleep disorder or a cause for the patient's sleep-related symptoms. Causes include (but are not limited to) the first night effect of the sleep study, circadian rhythm disturbances, medication effect or an underlying mood disorder or medical problem.  3.?The patient should be cautioned not to drive, work at heights, or operate dangerous or heavy equipment when tired or sleepy. Review and reiteration of good sleep hygiene measures should be pursued with any patient. 4.?The patient will be seen in follow-up in the sleep clinic at [facility] for discussion of the test results, symptom and treatment compliance review, further management strategies, etc. The patient and the referring provider will be notified of the test results.  I certify that I have reviewed the entire raw data recording prior to the issuance  of this report in accordance with the Standards of Accreditation of the American Academy of Sleep Medicine (AASM). ? Huston Foley, MD, PhD [facility]) Diplomat, ABPN (Neurology and Sleep)                Technical Report:   [facility] CPAP Summary    General Information  Name: Adam Ponce, Adam Ponce BMI: 38.77 Physician: Huston Foley, MD  ID: [id number] Height: 71.0  in Technician: Harvest Forest, RPSGT  Sex: Male Weight: 278.0 lb Record: [id number]  Age: 6 [December 10, 1950] Date: 01/19/2022     Medical & Medication History    Adam Ponce is a 71 year old right-handed gentleman with an underlying medical history of hypertension, diabetes, asthma, arthritis, HIV disease, history of aseptic necrosis of the femur, cellulitis of the right lower extremity, chronic kidney disease, and obesity, who was previously diagnosed with obstructive sleep apnea and placed on PAP therapy. Prior sleep study results are not available for my review today. [facility], no sleep study reports in the [place] records. He reports that sleep testing was 3 to 4 years ago and his machine was used for about a week, he felt that he could not keep the mask on as he tosses and turns. He still has the machine but has not used it for 3 to 4 years, he believes sleep testing was maybe 5 years ago or over 5 years ago even. He is unable to provide any details. He lives with his roommate, goes to bed around 11 and rise time is around 3. His roommate has to wake up early for dialysis and patient wakes up with him. He takes a nap throughout the day, unclear how long. He does not drink caffeine daily but does drink some diet sodas, no alcohol currently, quit smoking some 15 or 20 years ago. He does endorse being forgetful and having trouble remembering information. He is followed by infectious diseases through the [place]. He reports no longer taking Coumadin but [facility]in on [facility] med list. He reports a history of atrial fibrillation and having had surgery for this, possibly an ablation. His Epworth sleepiness score is 7 out of 24, fatigue severity score is 55 out of 63. He is supposed to have knee replacements done but he reports that they will not do it until he loses weight. 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  Albuterol,  Azelastine, Bacitracin, Valisone, Zytrec, Vitamin D3, Ciprodex, Vitamin B12, Prezista, Colace, Emtricitabine-Tenofovir AF, Zetia, Pepcid, Tricor, Glucotrol, Mucinex, Insulin Glargine, Atrovent, Lisinopril, Glucophage, Pravachol, Lyrica, Norvir, Ocean, Semaglutide, Senokot-S, Eucerin, Ultram, Kenalog, Valacyclovir HCL, Coumadin   Sleep Disorder      Comments   Patient arrived at the sleep lab for a CPAP study. Patient had PSG study on 12/14/21 and had an overall AHI of 16. 4% desat rule used. Patient wanted to start with nasal pillows, so size large P10 was used for titration. He was eventually switched to a medium Simplus due to oral venting. Occasional mild snoring. 2 bathroom breaks. Bradycardia noted. PLMs noted towards the end of the study.    CPAP start time: 09:41:15 PM CPAP end time: 05:05:02 AM   Time Total Supine Side Prone Upright  Recording (TRT) 7h 23.51m 2h 48.80m 4h 35.16m 0h 0.10m 0h 0.73m  Sleep (TST) 4h 30.64m 1h 57.47m 2h 33.28m 0h 0.48m 0h 0.58m   Latency N1 N2  N3 REM Onset Per. Slp. Eff.  Actual 0h 0.273m 0h 1.8746m 0h 0.423m 3h 20.5846m 0h 56.3546m 1h 34.7646m 60.99%   Stg Dur Wake N1 N2 N3 REM  Total 173.0 13.5 198.0 0.0 58.5  Supine 51.5 1.5 97.5 0.0 18.0  Side 121.5 12.0 100.5 0.0 40.5  Prone 0.0 0.0 0.0 0.0 0.0  Upright 0.0 0.0 0.0 0.0 0.0   Stg % Wake N1 N2 N3 REM  Total 39.0 5.0 73.2 0.0 21.6  Supine 11.6 0.6 36.0 0.0 6.7  Side 27.4 4.4 37.2 0.0 15.0  Prone 0.0 0.0 0.0 0.0 0.0  Upright 0.0 0.0 0.0 0.0 0.0     Apnea Summary Sub Supine Side Prone Upright  Total 9 Total 9 3 6  0 0    REM 0 0 0 0 0    NREM 9 3 6  0 0  Obs 6 REM 0 0 0 0 0    NREM 6 2 4  0 0  Mix 0 REM 0 0 0 0 0    NREM 0 0 0 0 0  Cen 3 REM 0 0 0 0 0    NREM 3 1 2  0 0   Rera Summary Sub Supine Side Prone Upright  Total 0 Total 0 0 0 0 0    REM 0 0 0 0 0    NREM 0 0 0 0 0   Hypopnea Summary Sub Supine Side Prone Upright  Total 76 Total 76 16 60 0 0    REM 7 1 6  0 0    NREM 69 15 54 0 0   4% Hypopnea Summary Sub  Supine Side Prone Upright  Total (4%) 74 Total 74 16 58 0 0    REM 7 1 6  0 0    NREM 67 15 52 0 0     AHI Total Obs Mix Cen  18.85 Apnea 2.00 1.33 0.00 0.67   Hypopnea 16.86 -- -- --  18.41 Hypopnea (4%) 16.41 -- -- --    Total Supine Side Prone Upright  Position AHI 18.85 9.74 25.80 0.00 0.00  REM AHI 7.18   NREM AHI 22.13   Position RDI 18.85 9.74 25.80 0.00 0.00  REM RDI 7.18   NREM RDI 22.13    4% Hypopnea Total Supine Side Prone Upright  Position AHI (4%) 18.41 9.74 25.02 0.00 0.00  REM AHI (4%) 7.18   NREM AHI (4%) 21.56   Position RDI (4%) 18.41 9.74 25.02 0.00 0.00  REM RDI (4%) 7.18   NREM RDI (4%) 21.56    Desaturation Information  <100% <90% <80% <70% <60% <50% <40%  Supine 53 3 0 0 0 0 0  Side 156 7 0 0 0 0 0  Prone 0 0 0 0 0 0 0  Upright 0 0 0 0 0 0 0  Total 209 10 0 0 0 0 0  Desaturation threshold setting: 3% Minimum desaturation setting: 10 seconds SaO2 nadir: 76% The longest event was a 33 sec obstructive Hypopnea with a minimum SaO2 of 94%. The lowest SaO2 was 88% associated with a 16 sec obstructive Apnea. EKG Rates EKG Avg Max Min  Awake 58 84 48  Asleep 54 71 39  EKG Events: N/A Awakening/Arousal Information # of Awakenings 15  Wake after sleep onset 116.4846m  Wake after persistent sleep 97.353m   Arousal Assoc. Arousals Index  Apneas 1 0.2  Hypopneas 3 0.7  Leg Movements 0 0.0  Snore 0.0 0.0  PTT Arousals 0  0.0  Spontaneous 13 2.9  Total 17 3.8  Myoclonus Information PLMS LMs Index  Total LMs during PLMS 64 14.2  LMs w/ Microarousals 0 0.0   LM LMs Index  w/ Microarousal 0 0.0  w/ Awakening 1 0.2  w/ Resp Event 0 0.0  Spontaneous 1 0.2  Total 1 0.2     Titration Table:  [facility] CPAP/Bilevel Report    General Information  Name: Adam Ponce, Adam Ponce BMI: 16 Physician: Huston Foley, MD  ID: [id number] Height: 71 in Technician: Harvest Forest  Sex: Male Weight: 278 lb Record: [id number]   Age: 89 [Feb 15, 1951] Date: 01/19/2022 Scorer: Harvest Forest   Recommended Settings IPAP: N/A cmH20 EPAP: N/A cmH2O AHI: N/A AHI (4%): N/A   Pressure IPAP/EPAP 00 05 07 08 09 11 12 13    O2 Vol 0.0 0.0 0.0 0.0 0.0 0.0 0.0 0.0  Time TRT 0.59m 96.50m 28.24m 71.4m 64.58m 26.20m 111.65m 46.48m   TST 0.50m 20.2m 28.68m 27.75m 57.33m 26.86m 107.63m 5.55m  Sleep Stage % Wake 0.0 79.2 0.0 62.0 11.6 0.0 3.6 89.2   % REM 0.0 0.0 0.0 0.0 5.3 100.0 27.6 0.0   % N1 0.0 32.5 0.0 16.7 1.8 0.0 1.4 0.0   % N2 0.0 67.5 100.0 83.3 92.1 0.0 71.0 100.0   % N3 0.0 0.0 0.0 0.0 0.0 0.0 0.0 0.0  Respiratory Total Events 0 33 3 16 21 4 7 1    Obs. Apn. 0 6 0 0 0 0 0 0   Mixed Apn. 0 0 0 0 0 0 0 0   Cen. Apn. 0 3 0 0 0 0 0 0   Hypopneas 0 24 3 16 21 4 7 1    AHI 0.00 99.00 6.32 35.56 22.11 9.23 3.93 12.00   Supine AHI 0.00 96.00 0.00 0.00 28.42 0.00 3.77 0.00   Prone AHI 0.00 0.00 0.00 0.00 0.00 0.00 0.00 0.00   Side AHI 0.00 99.43 6.32 35.56 18.95 9.23 5.22 12.00  Respiratory (4%) Hypopneas (4%) 0.00 24.00 3.00 15.00 21.00 4.00 7.00 0.00   AHI (4%) 0.00 99.00 6.32 33.33 22.11 9.23 3.93 0.00   Supine AHI (4%) 0.00 96.00 0.00 0.00 28.42 0.00 3.77 0.00   Prone AHI (4%) 0.00 0.00 0.00 0.00 0.00 0.00 0.00 0.00   Side AHI (4%) 0.00 99.43 6.32 33.33 18.95 9.23 5.22 0.00  Desat Profile <= 90% 0.76m 2.22m 0.51m 11.59m 0.79m 0.53m 0.46m 5.99m   <= 80% 0.78m 0.30m 0.47m 11.56m 0.65m 0.46m 0.56m 5.24m   <= 70% 0.68m 0.67m 0.59m 11.51m 0.23m 0.39m 0.59m 5.27m   <= 60% 0.32m 0.7m 0.58m 11.27m 0.75m 0.37m 0.10m 5.32m  Arousal Index Apnea 0.0 3.0 0.0 0.0 0.0 0.0 0.0 0.0   Hypopnea 0.0 9.0 0.0 0.0 0.0 0.0 0.0 0.0   LM 0.0 0.0 0.0 0.0 0.0 0.0 0.0 0.0   Spontaneous 0.0 9.0 0.0 6.7 1.1 2.3 2.2 12.0

## 2022-02-01 ENCOUNTER — Telehealth: Payer: Self-pay

## 2022-02-01 NOTE — Telephone Encounter (Signed)
I called patient to discuss. No answer, left a message asking him to call us back.  

## 2022-02-01 NOTE — Telephone Encounter (Signed)
-----   Message from Star Age, MD sent at 01/29/2022 12:30 PM EDT ----- Patient referred by the [facility] for re-eval of his OSA, seen by me on 10/28/21, diagnostic PSG on 12/14/21.  Patient had a CPAP titration study on 01/19/22.  Please call and inform patient that I have entered an order for treatment with positive airway pressure (PAP) treatment for obstructive sleep apnea (OSA). He did well during the latest sleep study with CPAP. We will, therefore, arrange for a machine for home use through a DME (durable medical equipment) company of His choice; and I will see the patient back in follow-up in about 10 weeks. Please also explain to the patient that I will be looking out for compliance data, which can be downloaded from the machine (stored on an SD card, that is inserted in the machine) or via remote access through a modem, that is built into the machine. At the time of the followup appointment we will discuss sleep study results and how it is going with PAP treatment at home. Please advise patient to bring His machine at the time of the first FU visit, even though this is cumbersome. Bringing the machine for every visit after that will likely not be needed, but often helps for the first visit to troubleshoot if needed. Please re-enforce the importance of compliance with treatment and the need for [place] to monitor compliance data - often an insurance requirement and actually good feedback for the patient as far as how they are doing.  Also remind patient, that any interim PAP machine or mask issues should be first addressed with the DME company, as they can often help better with technical and mask fit issues. Please ask if patient has a preference regarding DME company.  Please also make sure, the patient has a follow-up appointment with me in about 10 weeks from the setup date, thanks. May see one of our nurse practitioners if needed for proper timing of the FU appointment.  Please fax or rout report to the  referring provider. Thanks,   Star Age, MD, PhD [facility])

## 2022-02-02 NOTE — Telephone Encounter (Signed)
I called the pt and discussed his sleep study results. He verbalized understanding and he is amenable to proceeding with CPAP although he did ask about cost. I told him this would need to be discussed with the DME company. He did not have a preference of DME. His questions were answered. We discussed insurance compliance requirements which includes using machine at least 4 hours each night and also being seen in our office for initial follow-up between 30 and 90 days after setup. Pt scheduled appt for Monday 05/03/22 at 945 am arrival 15 minutes early w/ machine and power cord. He took [facility]'s phone number in case he doesn't hear from them within 1 week. Pt verbalized appreciation for the call.  Referral faxed to [place]. Received a receipt of confirmation.

## 2022-03-03 NOTE — Telephone Encounter (Signed)
I called pt back.  [facility] is not in network with [facility].  Will send to [facility] gave him the # [phone].  He will call in about week if not heard from them.  He appreciated call back.. will keep the 04/2022 appt for initial cpap.

## 2022-03-03 NOTE — Telephone Encounter (Signed)
Pt has called to report that [facility] is out of network for him, he would like a call to discuss what [phone]) will cover

## 2022-03-04 NOTE — Telephone Encounter (Signed)
New, Doristine Mango, RN; Henderson Newcomer; Marveen Reeks; Santina Evans; Kathyrn Sheriff Received, Thank you!     Previous Messages    ----- Message ----- From: Adam Begin, RN Sent: 03/03/2022   3:09 PM EST To: Henderson Newcomer; Kathyrn Sheriff; Santina Evans; * Subject: new PAP user                                  New order for pt in [facility]  new cpap user.  Adam Ponce Male, 71 y.o., 03-07-1951 MRN: [id number] Phone: [phone]   Delmer Islam

## 2022-05-03 ENCOUNTER — Ambulatory Visit: Payer: No Typology Code available for payment source | Admitting: Neurology

## 2022-06-07 ENCOUNTER — Ambulatory Visit: Payer: No Typology Code available for payment source | Admitting: Neurology

## 2023-04-14 ENCOUNTER — Inpatient Hospital Stay (HOSPITAL_COMMUNITY)
Admission: EM | Admit: 2023-04-14 | Discharge: 2023-04-23 | DRG: 276 | Disposition: A | Discharge status: Skilled Nursing Facility | Payer: No Typology Code available for payment source | Source: Skilled Nursing Facility | Attending: Family Medicine | Admitting: Family Medicine

## 2023-04-14 ENCOUNTER — Emergency Department (HOSPITAL_COMMUNITY): Payer: No Typology Code available for payment source

## 2023-04-14 ENCOUNTER — Encounter (HOSPITAL_COMMUNITY): Payer: Self-pay

## 2023-04-14 DIAGNOSIS — Z7985 Long-term (current) use of injectable non-insulin antidiabetic drugs: Secondary | ICD-10-CM

## 2023-04-14 DIAGNOSIS — Z885 Allergy status to narcotic agent status: Secondary | ICD-10-CM

## 2023-04-14 DIAGNOSIS — M62572 Muscle wasting and atrophy, not elsewhere classified, left ankle and foot: Secondary | ICD-10-CM | POA: Diagnosis present

## 2023-04-14 DIAGNOSIS — Z881 Allergy status to other antibiotic agents status: Secondary | ICD-10-CM

## 2023-04-14 DIAGNOSIS — I4940 Unspecified premature depolarization: Secondary | ICD-10-CM | POA: Diagnosis not present

## 2023-04-14 DIAGNOSIS — B20 Human immunodeficiency virus [HIV] disease: Secondary | ICD-10-CM | POA: Diagnosis present

## 2023-04-14 DIAGNOSIS — M86172 Other acute osteomyelitis, left ankle and foot: Secondary | ICD-10-CM | POA: Diagnosis present

## 2023-04-14 DIAGNOSIS — L03032 Cellulitis of left toe: Secondary | ICD-10-CM | POA: Diagnosis present

## 2023-04-14 DIAGNOSIS — Z5941 Food insecurity: Secondary | ICD-10-CM

## 2023-04-14 DIAGNOSIS — I10 Essential (primary) hypertension: Secondary | ICD-10-CM | POA: Diagnosis present

## 2023-04-14 DIAGNOSIS — E1140 Type 2 diabetes mellitus with diabetic neuropathy, unspecified: Secondary | ICD-10-CM | POA: Diagnosis present

## 2023-04-14 DIAGNOSIS — F17210 Nicotine dependence, cigarettes, uncomplicated: Secondary | ICD-10-CM | POA: Diagnosis present

## 2023-04-14 DIAGNOSIS — R6520 Severe sepsis without septic shock: Principal | ICD-10-CM | POA: Diagnosis present

## 2023-04-14 DIAGNOSIS — R55 Syncope and collapse: Secondary | ICD-10-CM | POA: Diagnosis present

## 2023-04-14 DIAGNOSIS — I330 Acute and subacute infective endocarditis: Secondary | ICD-10-CM | POA: Diagnosis present

## 2023-04-14 DIAGNOSIS — I071 Rheumatic tricuspid insufficiency: Secondary | ICD-10-CM | POA: Diagnosis present

## 2023-04-14 DIAGNOSIS — A4101 Sepsis due to Methicillin susceptible Staphylococcus aureus: Secondary | ICD-10-CM | POA: Diagnosis present

## 2023-04-14 DIAGNOSIS — Z5982 Transportation insecurity: Secondary | ICD-10-CM

## 2023-04-14 DIAGNOSIS — B961 Klebsiella pneumoniae [K. pneumoniae] as the cause of diseases classified elsewhere: Secondary | ICD-10-CM | POA: Diagnosis present

## 2023-04-14 DIAGNOSIS — Z7901 Long term (current) use of anticoagulants: Secondary | ICD-10-CM

## 2023-04-14 DIAGNOSIS — M869 Osteomyelitis, unspecified: Secondary | ICD-10-CM

## 2023-04-14 DIAGNOSIS — A419 Sepsis, unspecified organism: Principal | ICD-10-CM

## 2023-04-14 DIAGNOSIS — L089 Local infection of the skin and subcutaneous tissue, unspecified: Secondary | ICD-10-CM | POA: Diagnosis present

## 2023-04-14 DIAGNOSIS — F0390 Unspecified dementia without behavioral disturbance: Secondary | ICD-10-CM | POA: Diagnosis present

## 2023-04-14 DIAGNOSIS — K219 Gastro-esophageal reflux disease without esophagitis: Secondary | ICD-10-CM | POA: Diagnosis present

## 2023-04-14 DIAGNOSIS — I4892 Unspecified atrial flutter: Secondary | ICD-10-CM | POA: Diagnosis present

## 2023-04-14 DIAGNOSIS — Z6838 Body mass index (BMI) 38.0-38.9, adult: Secondary | ICD-10-CM

## 2023-04-14 DIAGNOSIS — R748 Abnormal levels of other serum enzymes: Secondary | ICD-10-CM | POA: Diagnosis present

## 2023-04-14 DIAGNOSIS — N400 Enlarged prostate without lower urinary tract symptoms: Secondary | ICD-10-CM | POA: Diagnosis present

## 2023-04-14 DIAGNOSIS — Z8249 Family history of ischemic heart disease and other diseases of the circulatory system: Secondary | ICD-10-CM

## 2023-04-14 DIAGNOSIS — N179 Acute kidney failure, unspecified: Secondary | ICD-10-CM | POA: Diagnosis not present

## 2023-04-14 DIAGNOSIS — E119 Type 2 diabetes mellitus without complications: Secondary | ICD-10-CM

## 2023-04-14 DIAGNOSIS — L02612 Cutaneous abscess of left foot: Secondary | ICD-10-CM | POA: Diagnosis present

## 2023-04-14 DIAGNOSIS — N189 Chronic kidney disease, unspecified: Secondary | ICD-10-CM

## 2023-04-14 DIAGNOSIS — Z7984 Long term (current) use of oral hypoglycemic drugs: Secondary | ICD-10-CM

## 2023-04-14 DIAGNOSIS — Z66 Do not resuscitate: Secondary | ICD-10-CM | POA: Diagnosis present

## 2023-04-14 DIAGNOSIS — Z888 Allergy status to other drugs, medicaments and biological substances status: Secondary | ICD-10-CM

## 2023-04-14 DIAGNOSIS — B9561 Methicillin susceptible Staphylococcus aureus infection as the cause of diseases classified elsewhere: Secondary | ICD-10-CM

## 2023-04-14 DIAGNOSIS — R066 Hiccough: Secondary | ICD-10-CM | POA: Diagnosis present

## 2023-04-14 DIAGNOSIS — Y831 Surgical operation with implant of artificial internal device as the cause of abnormal reaction of the patient, or of later complication, without mention of misadventure at the time of the procedure: Secondary | ICD-10-CM | POA: Diagnosis present

## 2023-04-14 DIAGNOSIS — G4733 Obstructive sleep apnea (adult) (pediatric): Secondary | ICD-10-CM | POA: Diagnosis present

## 2023-04-14 DIAGNOSIS — E11628 Type 2 diabetes mellitus with other skin complications: Secondary | ICD-10-CM | POA: Diagnosis present

## 2023-04-14 DIAGNOSIS — E1165 Type 2 diabetes mellitus with hyperglycemia: Secondary | ICD-10-CM | POA: Diagnosis present

## 2023-04-14 DIAGNOSIS — I129 Hypertensive chronic kidney disease with stage 1 through stage 4 chronic kidney disease, or unspecified chronic kidney disease: Secondary | ICD-10-CM | POA: Diagnosis present

## 2023-04-14 DIAGNOSIS — Z96642 Presence of left artificial hip joint: Secondary | ICD-10-CM | POA: Diagnosis present

## 2023-04-14 DIAGNOSIS — E785 Hyperlipidemia, unspecified: Secondary | ICD-10-CM | POA: Diagnosis present

## 2023-04-14 DIAGNOSIS — E669 Obesity, unspecified: Secondary | ICD-10-CM | POA: Diagnosis present

## 2023-04-14 DIAGNOSIS — I509 Heart failure, unspecified: Secondary | ICD-10-CM

## 2023-04-14 DIAGNOSIS — Z950 Presence of cardiac pacemaker: Secondary | ICD-10-CM

## 2023-04-14 DIAGNOSIS — Z794 Long term (current) use of insulin: Secondary | ICD-10-CM

## 2023-04-14 DIAGNOSIS — T796XXA Traumatic ischemia of muscle, initial encounter: Secondary | ICD-10-CM | POA: Diagnosis present

## 2023-04-14 DIAGNOSIS — N390 Urinary tract infection, site not specified: Secondary | ICD-10-CM | POA: Diagnosis present

## 2023-04-14 DIAGNOSIS — Z79899 Other long term (current) drug therapy: Secondary | ICD-10-CM

## 2023-04-14 DIAGNOSIS — N1831 Chronic kidney disease, stage 3a: Secondary | ICD-10-CM | POA: Diagnosis present

## 2023-04-14 DIAGNOSIS — W19XXXA Unspecified fall, initial encounter: Secondary | ICD-10-CM | POA: Diagnosis present

## 2023-04-14 DIAGNOSIS — T827XXA Infection and inflammatory reaction due to other cardiac and vascular devices, implants and grafts, initial encounter: Secondary | ICD-10-CM | POA: Diagnosis not present

## 2023-04-14 DIAGNOSIS — Z89422 Acquired absence of other left toe(s): Secondary | ICD-10-CM

## 2023-04-14 DIAGNOSIS — R531 Weakness: Secondary | ICD-10-CM

## 2023-04-14 DIAGNOSIS — J4489 Other specified chronic obstructive pulmonary disease: Secondary | ICD-10-CM | POA: Diagnosis present

## 2023-04-14 DIAGNOSIS — J849 Interstitial pulmonary disease, unspecified: Secondary | ICD-10-CM | POA: Diagnosis present

## 2023-04-14 DIAGNOSIS — M6282 Rhabdomyolysis: Secondary | ICD-10-CM

## 2023-04-14 DIAGNOSIS — T8744 Infection of amputation stump, left lower extremity: Secondary | ICD-10-CM | POA: Diagnosis present

## 2023-04-14 DIAGNOSIS — E1122 Type 2 diabetes mellitus with diabetic chronic kidney disease: Secondary | ICD-10-CM | POA: Diagnosis present

## 2023-04-14 HISTORY — DX: Presence of cardiac pacemaker: Z95.0

## 2023-04-14 LAB — CBC WITH DIFFERENTIAL/PLATELET
Abs Immature Granulocytes: 0.13 10*3/uL — ABNORMAL HIGH (ref 0.00–0.07)
Basophils Absolute: 0 10*3/uL (ref 0.0–0.1)
Basophils Relative: 0 %
Eosinophils Absolute: 0 10*3/uL (ref 0.0–0.5)
Eosinophils Relative: 0 %
HCT: 42.7 % (ref 39.0–52.0)
Hemoglobin: 14.3 g/dL (ref 13.0–17.0)
Immature Granulocytes: 1 %
Lymphocytes Relative: 12 %
Lymphs Abs: 1.9 10*3/uL (ref 0.7–4.0)
MCH: 31.4 pg (ref 26.0–34.0)
MCHC: 33.5 g/dL (ref 30.0–36.0)
MCV: 93.8 fL (ref 80.0–100.0)
Monocytes Absolute: 1.4 10*3/uL — ABNORMAL HIGH (ref 0.1–1.0)
Monocytes Relative: 9 %
Neutro Abs: 11.9 10*3/uL — ABNORMAL HIGH (ref 1.7–7.7)
Neutrophils Relative %: 78 %
Platelets: 238 10*3/uL (ref 150–400)
RBC: 4.55 MIL/uL (ref 4.22–5.81)
RDW: 14.3 % (ref 11.5–15.5)
WBC: 15.3 10*3/uL — ABNORMAL HIGH (ref 4.0–10.5)
nRBC: 0 % (ref 0.0–0.2)

## 2023-04-14 LAB — COMPREHENSIVE METABOLIC PANEL
ALT: 22 U/L (ref 0–44)
AST: 39 U/L (ref 15–41)
Albumin: 3.6 g/dL (ref 3.5–5.0)
Alkaline Phosphatase: 55 U/L (ref 38–126)
Anion gap: 12 (ref 5–15)
BUN: 25 mg/dL — ABNORMAL HIGH (ref 8–23)
CO2: 21 mmol/L — ABNORMAL LOW (ref 22–32)
Calcium: 9.8 mg/dL (ref 8.9–10.3)
Chloride: 100 mmol/L (ref 98–111)
Creatinine, Ser: 2.34 mg/dL — ABNORMAL HIGH (ref 0.61–1.24)
GFR, Estimated: 29 mL/min — ABNORMAL LOW (ref >60)
Glucose, Bld: 247 mg/dL — ABNORMAL HIGH (ref 70–99)
Potassium: 4.8 mmol/L (ref 3.5–5.1)
Sodium: 133 mmol/L — ABNORMAL LOW (ref 135–145)
Total Bilirubin: 0.8 mg/dL (ref <1.2)
Total Protein: 8.2 g/dL — ABNORMAL HIGH (ref 6.5–8.1)

## 2023-04-14 MED ORDER — SODIUM CHLORIDE 0.9 % IV SOLN
2.0000 g | Freq: Once | INTRAVENOUS | Status: AC
Start: 2023-04-15 — End: 2023-04-15
  Administered 2023-04-15: 2 g via INTRAVENOUS
  Filled 2023-04-14: qty 12.5

## 2023-04-14 MED ORDER — METRONIDAZOLE 500 MG/100ML IV SOLN
500.0000 mg | Freq: Once | INTRAVENOUS | Status: AC
  Administered 2023-04-15: 500 mg via INTRAVENOUS
  Filled 2023-04-14: qty 100

## 2023-04-14 MED ORDER — VANCOMYCIN HCL IN DEXTROSE 1-5 GM/200ML-% IV SOLN: 1000.0000 mg | Freq: Once | INTRAVENOUS | Status: DC

## 2023-04-14 NOTE — ED Triage Notes (Signed)
Pt is coming in from home, he is coming in for a fall in which he did not hit his head, with no LOC, He states that the fall occurred awhile away, was found by some carpet cleaners bare naked in the floor. He states he fell because his knees gave way. Pt has no other complaints at this time. He has eaten and taken his medications today before the alleged fall.   Medic vitals   148/114 91hr 20rr 96%ra 327bgl

## 2023-04-14 NOTE — ED Provider Triage Note (Signed)
Emergency Medicine Provider Triage Evaluation Note  Adam Ponce , a 72 y.o. male  was evaluated in triage.  Pt complains of fall.  Review of Systems  Positive:  Negative:  Physical Exam  BP (!) 146/70 (BP Location: Right Arm)   Pulse 87   Temp 98.8 F (37.1 C) (Oral)   Resp 17   SpO2 94%  Gen:   Awake, no distress   Resp:  Normal effort  MSK:   Moves extremities without difficulty  Other:    Medical Decision Making  Medically screening exam initiated at 6:25 PM.  Appropriate orders placed.  Henrine Screws was informed that the remainder of the evaluation will be completed by another provider, this initial triage assessment does not replace that evaluation, and the importance of remaining in the ED until their evaluation is complete.  Patient fell while using the bathroom. Unsure if they lost consciousness. States that EMS had to help off of the floor. Concerned for hip, knee, and ankle pains.   Dorthy Cooler, [place] 04/14/23 [phone]

## 2023-04-14 NOTE — ED Notes (Signed)
HAVE THE PT CALL LINDA [phone]

## 2023-04-15 ENCOUNTER — Emergency Department (HOSPITAL_COMMUNITY): Payer: No Typology Code available for payment source

## 2023-04-15 ENCOUNTER — Other Ambulatory Visit: Payer: Self-pay

## 2023-04-15 ENCOUNTER — Inpatient Hospital Stay (HOSPITAL_COMMUNITY): Payer: No Typology Code available for payment source

## 2023-04-15 DIAGNOSIS — A419 Sepsis, unspecified organism: Secondary | ICD-10-CM | POA: Diagnosis not present

## 2023-04-15 DIAGNOSIS — W19XXXA Unspecified fall, initial encounter: Secondary | ICD-10-CM | POA: Diagnosis present

## 2023-04-15 DIAGNOSIS — I509 Heart failure, unspecified: Secondary | ICD-10-CM | POA: Diagnosis not present

## 2023-04-15 DIAGNOSIS — Z794 Long term (current) use of insulin: Secondary | ICD-10-CM | POA: Diagnosis not present

## 2023-04-15 DIAGNOSIS — M869 Osteomyelitis, unspecified: Secondary | ICD-10-CM | POA: Diagnosis not present

## 2023-04-15 DIAGNOSIS — E669 Obesity, unspecified: Secondary | ICD-10-CM | POA: Diagnosis present

## 2023-04-15 DIAGNOSIS — R55 Syncope and collapse: Secondary | ICD-10-CM

## 2023-04-15 DIAGNOSIS — T827XXD Infection and inflammatory reaction due to other cardiac and vascular devices, implants and grafts, subsequent encounter: Secondary | ICD-10-CM | POA: Diagnosis not present

## 2023-04-15 DIAGNOSIS — F0390 Unspecified dementia without behavioral disturbance: Secondary | ICD-10-CM | POA: Diagnosis present

## 2023-04-15 DIAGNOSIS — R6520 Severe sepsis without septic shock: Secondary | ICD-10-CM | POA: Diagnosis present

## 2023-04-15 DIAGNOSIS — E1169 Type 2 diabetes mellitus with other specified complication: Secondary | ICD-10-CM | POA: Diagnosis not present

## 2023-04-15 DIAGNOSIS — M86172 Other acute osteomyelitis, left ankle and foot: Secondary | ICD-10-CM | POA: Diagnosis present

## 2023-04-15 DIAGNOSIS — E119 Type 2 diabetes mellitus without complications: Secondary | ICD-10-CM | POA: Diagnosis not present

## 2023-04-15 DIAGNOSIS — N390 Urinary tract infection, site not specified: Secondary | ICD-10-CM

## 2023-04-15 DIAGNOSIS — N189 Chronic kidney disease, unspecified: Secondary | ICD-10-CM

## 2023-04-15 DIAGNOSIS — Z950 Presence of cardiac pacemaker: Secondary | ICD-10-CM | POA: Diagnosis not present

## 2023-04-15 DIAGNOSIS — R531 Weakness: Secondary | ICD-10-CM

## 2023-04-15 DIAGNOSIS — I4892 Unspecified atrial flutter: Secondary | ICD-10-CM | POA: Diagnosis present

## 2023-04-15 DIAGNOSIS — I110 Hypertensive heart disease with heart failure: Secondary | ICD-10-CM | POA: Diagnosis not present

## 2023-04-15 DIAGNOSIS — B961 Klebsiella pneumoniae [K. pneumoniae] as the cause of diseases classified elsewhere: Secondary | ICD-10-CM | POA: Diagnosis present

## 2023-04-15 DIAGNOSIS — T796XXA Traumatic ischemia of muscle, initial encounter: Secondary | ICD-10-CM | POA: Diagnosis present

## 2023-04-15 DIAGNOSIS — J849 Interstitial pulmonary disease, unspecified: Secondary | ICD-10-CM | POA: Diagnosis present

## 2023-04-15 DIAGNOSIS — N179 Acute kidney failure, unspecified: Secondary | ICD-10-CM | POA: Diagnosis present

## 2023-04-15 DIAGNOSIS — L089 Local infection of the skin and subcutaneous tissue, unspecified: Secondary | ICD-10-CM | POA: Diagnosis not present

## 2023-04-15 DIAGNOSIS — T827XXA Infection and inflammatory reaction due to other cardiac and vascular devices, implants and grafts, initial encounter: Secondary | ICD-10-CM | POA: Diagnosis present

## 2023-04-15 DIAGNOSIS — R7881 Bacteremia: Secondary | ICD-10-CM | POA: Diagnosis not present

## 2023-04-15 DIAGNOSIS — I129 Hypertensive chronic kidney disease with stage 1 through stage 4 chronic kidney disease, or unspecified chronic kidney disease: Secondary | ICD-10-CM | POA: Diagnosis present

## 2023-04-15 DIAGNOSIS — E11628 Type 2 diabetes mellitus with other skin complications: Secondary | ICD-10-CM | POA: Diagnosis not present

## 2023-04-15 DIAGNOSIS — B9561 Methicillin susceptible Staphylococcus aureus infection as the cause of diseases classified elsewhere: Secondary | ICD-10-CM | POA: Diagnosis not present

## 2023-04-15 DIAGNOSIS — I071 Rheumatic tricuspid insufficiency: Secondary | ICD-10-CM | POA: Diagnosis present

## 2023-04-15 DIAGNOSIS — E1122 Type 2 diabetes mellitus with diabetic chronic kidney disease: Secondary | ICD-10-CM | POA: Diagnosis present

## 2023-04-15 DIAGNOSIS — I361 Nonrheumatic tricuspid (valve) insufficiency: Secondary | ICD-10-CM

## 2023-04-15 DIAGNOSIS — J4489 Other specified chronic obstructive pulmonary disease: Secondary | ICD-10-CM | POA: Diagnosis present

## 2023-04-15 DIAGNOSIS — M6282 Rhabdomyolysis: Secondary | ICD-10-CM

## 2023-04-15 DIAGNOSIS — T8744 Infection of amputation stump, left lower extremity: Secondary | ICD-10-CM | POA: Diagnosis present

## 2023-04-15 DIAGNOSIS — J449 Chronic obstructive pulmonary disease, unspecified: Secondary | ICD-10-CM | POA: Diagnosis not present

## 2023-04-15 DIAGNOSIS — L98499 Non-pressure chronic ulcer of skin of other sites with unspecified severity: Secondary | ICD-10-CM | POA: Diagnosis not present

## 2023-04-15 DIAGNOSIS — Z66 Do not resuscitate: Secondary | ICD-10-CM | POA: Diagnosis present

## 2023-04-15 DIAGNOSIS — Z89422 Acquired absence of other left toe(s): Secondary | ICD-10-CM | POA: Diagnosis not present

## 2023-04-15 DIAGNOSIS — B20 Human immunodeficiency virus [HIV] disease: Secondary | ICD-10-CM | POA: Diagnosis present

## 2023-04-15 DIAGNOSIS — I4891 Unspecified atrial fibrillation: Secondary | ICD-10-CM | POA: Diagnosis not present

## 2023-04-15 DIAGNOSIS — N1831 Chronic kidney disease, stage 3a: Secondary | ICD-10-CM | POA: Diagnosis present

## 2023-04-15 DIAGNOSIS — L02612 Cutaneous abscess of left foot: Secondary | ICD-10-CM | POA: Diagnosis present

## 2023-04-15 DIAGNOSIS — A4101 Sepsis due to Methicillin susceptible Staphylococcus aureus: Secondary | ICD-10-CM | POA: Diagnosis present

## 2023-04-15 DIAGNOSIS — Y831 Surgical operation with implant of artificial internal device as the cause of abnormal reaction of the patient, or of later complication, without mention of misadventure at the time of the procedure: Secondary | ICD-10-CM | POA: Diagnosis present

## 2023-04-15 DIAGNOSIS — I330 Acute and subacute infective endocarditis: Secondary | ICD-10-CM | POA: Diagnosis present

## 2023-04-15 LAB — BLOOD CULTURE ID PANEL (REFLEXED) - BCID2

## 2023-04-15 LAB — GLUCOSE, CAPILLARY
Glucose-Capillary: 231 mg/dL — ABNORMAL HIGH (ref 70–99)
Glucose-Capillary: 220 mg/dL — ABNORMAL HIGH (ref 70–99)
Glucose-Capillary: 225 mg/dL — ABNORMAL HIGH (ref 70–99)
Glucose-Capillary: 223 mg/dL — ABNORMAL HIGH (ref 70–99)

## 2023-04-15 LAB — ECHOCARDIOGRAM COMPLETE
AR max vel: 2.26 cm2
AV Area VTI: 1.97 cm2
AV Area mean vel: 2.22 cm2
AV Mean grad: 3.8 mm[Hg]
AV Peak grad: 7 mm[Hg]
Ao pk vel: 1.32 m/s
Area-P 1/2: 4.99 cm2
Calc EF: 55.8 %
Height: 71 in
MV VTI: 1.91 cm2
S' Lateral: 3.5 cm
Single Plane A2C EF: 58.6 %
Single Plane A4C EF: 57.3 %
Weight: 4444.47 [oz_av]

## 2023-04-15 LAB — CBG MONITORING, ED
Glucose-Capillary: 239 mg/dL — ABNORMAL HIGH (ref 70–99)
Glucose-Capillary: 224 mg/dL — ABNORMAL HIGH (ref 70–99)

## 2023-04-15 LAB — C-REACTIVE PROTEIN: CRP: 18.4 mg/dL — ABNORMAL HIGH (ref <1.0)

## 2023-04-15 LAB — URINALYSIS, ROUTINE W REFLEX MICROSCOPIC
Bilirubin Urine: NEGATIVE
Glucose, UA: 50 mg/dL — AB
Ketones, ur: NEGATIVE mg/dL
Nitrite: NEGATIVE
Protein, ur: 100 mg/dL — AB
Specific Gravity, Urine: 1.015 (ref 1.005–1.030)
WBC, UA: >50 WBC/hpf (ref 0–5)
pH: 5 (ref 5.0–8.0)

## 2023-04-15 LAB — CBC
HCT: 40 % (ref 39.0–52.0)
Hemoglobin: 13.7 g/dL (ref 13.0–17.0)
MCH: 32 pg (ref 26.0–34.0)
MCHC: 34.3 g/dL (ref 30.0–36.0)
MCV: 93.5 fL (ref 80.0–100.0)
Platelets: 224 10*3/uL (ref 150–400)
RBC: 4.28 MIL/uL (ref 4.22–5.81)
RDW: 14.4 % (ref 11.5–15.5)
WBC: 15.4 10*3/uL — ABNORMAL HIGH (ref 4.0–10.5)
nRBC: 0 % (ref 0.0–0.2)

## 2023-04-15 LAB — BASIC METABOLIC PANEL
Anion gap: 14 (ref 5–15)
BUN: 27 mg/dL — ABNORMAL HIGH (ref 8–23)
CO2: 19 mmol/L — ABNORMAL LOW (ref 22–32)
Calcium: 9.3 mg/dL (ref 8.9–10.3)
Chloride: 98 mmol/L (ref 98–111)
Creatinine, Ser: 2.16 mg/dL — ABNORMAL HIGH (ref 0.61–1.24)
GFR, Estimated: 32 mL/min — ABNORMAL LOW (ref >60)
Glucose, Bld: 266 mg/dL — ABNORMAL HIGH (ref 70–99)
Potassium: 4.4 mmol/L (ref 3.5–5.1)
Sodium: 131 mmol/L — ABNORMAL LOW (ref 135–145)

## 2023-04-15 LAB — CK: Total CK: 2253 U/L — ABNORMAL HIGH (ref 49–397)

## 2023-04-15 LAB — PROTIME-INR
INR: 1.2 (ref 0.8–1.2)
Prothrombin Time: 14.9 s (ref 11.4–15.2)

## 2023-04-15 LAB — APTT: aPTT: 31 s (ref 24–36)

## 2023-04-15 LAB — HEMOGLOBIN A1C
Hgb A1c MFr Bld: 8.6 % — ABNORMAL HIGH (ref 4.8–5.6)
Mean Plasma Glucose: 200 mg/dL

## 2023-04-15 LAB — BRAIN NATRIURETIC PEPTIDE: B Natriuretic Peptide: 275.4 pg/mL — ABNORMAL HIGH (ref 0.0–100.0)

## 2023-04-15 LAB — SEDIMENTATION RATE: Sed Rate: 91 mm/h — ABNORMAL HIGH (ref 0–16)

## 2023-04-15 LAB — I-STAT CG4 LACTIC ACID, ED
Lactic Acid, Venous: 1.3 mmol/L (ref 0.5–1.9)
Lactic Acid, Venous: 1.3 mmol/L (ref 0.5–1.9)

## 2023-04-15 MED ORDER — INSULIN ASPART 100 UNIT/ML IJ SOLN
0.0000 [IU] | INTRAMUSCULAR | Status: DC
  Administered 2023-04-15 – 2023-04-16 (×8): 3 [IU] via SUBCUTANEOUS
  Administered 2023-04-16: 5 [IU] via SUBCUTANEOUS
  Administered 2023-04-16: 3 [IU] via SUBCUTANEOUS
  Administered 2023-04-16: 5 [IU] via SUBCUTANEOUS
  Administered 2023-04-17: 3 [IU] via SUBCUTANEOUS
  Administered 2023-04-17: 7 [IU] via SUBCUTANEOUS
  Administered 2023-04-17: 3 [IU] via SUBCUTANEOUS
  Administered 2023-04-17: 5 [IU] via SUBCUTANEOUS
  Administered 2023-04-17: 3 [IU] via SUBCUTANEOUS
  Administered 2023-04-17 – 2023-04-18 (×2): 5 [IU] via SUBCUTANEOUS
  Administered 2023-04-18: 3 [IU] via SUBCUTANEOUS
  Administered 2023-04-18 (×2): 5 [IU] via SUBCUTANEOUS
  Administered 2023-04-18: 3 [IU] via SUBCUTANEOUS
  Administered 2023-04-19: 2 [IU] via SUBCUTANEOUS
  Administered 2023-04-19 (×2): 5 [IU] via SUBCUTANEOUS
  Administered 2023-04-19 (×2): 2 [IU] via SUBCUTANEOUS
  Administered 2023-04-20: 3 [IU] via SUBCUTANEOUS
  Administered 2023-04-20 (×2): 1 [IU] via SUBCUTANEOUS
  Administered 2023-04-20 (×2): 2 [IU] via SUBCUTANEOUS
  Administered 2023-04-20: 1 [IU] via SUBCUTANEOUS
  Administered 2023-04-20 – 2023-04-21 (×2): 2 [IU] via SUBCUTANEOUS
  Administered 2023-04-21: 1 [IU] via SUBCUTANEOUS
  Administered 2023-04-21 (×2): 2 [IU] via SUBCUTANEOUS
  Administered 2023-04-21: 3 [IU] via SUBCUTANEOUS
  Administered 2023-04-22 (×4): 2 [IU] via SUBCUTANEOUS
  Administered 2023-04-23 (×2): 1 [IU] via SUBCUTANEOUS
  Administered 2023-04-23: 2 [IU] via SUBCUTANEOUS

## 2023-04-15 MED ORDER — PERFLUTREN LIPID MICROSPHERE
1.0000 mL | INTRAVENOUS | Status: AC | PRN
  Administered 2023-04-15: 2 mL via INTRAVENOUS

## 2023-04-15 MED ORDER — SILVER SULFADIAZINE 1 % EX CREA
TOPICAL_CREAM | Freq: Once | CUTANEOUS | Status: AC
  Filled 2023-04-15: qty 85

## 2023-04-15 MED ORDER — ACETAMINOPHEN 650 MG RE SUPP
650.0000 mg | Freq: Four times a day (QID) | RECTAL | Status: DC | PRN
Start: 2023-04-15 — End: 2023-04-16

## 2023-04-15 MED ORDER — SODIUM CHLORIDE 0.9 % IV SOLN: INTRAVENOUS | Status: AC

## 2023-04-15 MED ORDER — ACETAMINOPHEN 325 MG PO TABS: 650.0000 mg | ORAL_TABLET | Freq: Four times a day (QID) | ORAL | Status: DC | PRN

## 2023-04-15 MED ORDER — SODIUM CHLORIDE 0.9 % IV BOLUS
1000.0000 mL | Freq: Once | INTRAVENOUS | Status: AC
  Administered 2023-04-15: 1000 mL via INTRAVENOUS

## 2023-04-15 MED ORDER — EMTRICITABINE-TENOFOVIR AF 200-25 MG PO TABS
1.0000 | ORAL_TABLET | Freq: Every day | ORAL | Status: DC
  Administered 2023-04-15 – 2023-04-23 (×9): 1 via ORAL
  Filled 2023-04-15 (×9): qty 1

## 2023-04-15 MED ORDER — METRONIDAZOLE 500 MG/100ML IV SOLN
500.0000 mg | Freq: Two times a day (BID) | INTRAVENOUS | Status: DC
  Administered 2023-04-15 – 2023-04-16 (×3): 500 mg via INTRAVENOUS
  Filled 2023-04-15 (×3): qty 100

## 2023-04-15 MED ORDER — SODIUM CHLORIDE 0.9 % IV SOLN
2.0000 g | Freq: Two times a day (BID) | INTRAVENOUS | Status: DC
  Administered 2023-04-15 – 2023-04-16 (×3): 2 g via INTRAVENOUS
  Filled 2023-04-15 (×3): qty 12.5

## 2023-04-15 MED ORDER — VANCOMYCIN HCL IN DEXTROSE 1-5 GM/200ML-% IV SOLN
1000.0000 mg | Freq: Once | INTRAVENOUS | Status: AC
  Administered 2023-04-15: 1000 mg via INTRAVENOUS
  Filled 2023-04-15: qty 200

## 2023-04-15 MED ORDER — VANCOMYCIN HCL IN DEXTROSE 1-5 GM/200ML-% IV SOLN
1000.0000 mg | INTRAVENOUS | Status: AC
  Administered 2023-04-15: 1000 mg via INTRAVENOUS
  Filled 2023-04-15: qty 200

## 2023-04-15 MED ORDER — DARUNAVIR-COBICISTAT 800-150 MG PO TABS
1.0000 | ORAL_TABLET | Freq: Every day | ORAL | Status: DC
  Administered 2023-04-15 – 2023-04-23 (×7): 1 via ORAL
  Filled 2023-04-15 (×11): qty 1

## 2023-04-15 MED ORDER — VANCOMYCIN HCL IN DEXTROSE 1-5 GM/200ML-% IV SOLN
1000.0000 mg | INTRAVENOUS | Status: DC
  Administered 2023-04-16: 1000 mg via INTRAVENOUS
  Filled 2023-04-15: qty 200

## 2023-04-15 MED ORDER — HYDROCODONE-ACETAMINOPHEN 5-325 MG PO TABS
1.0000 | ORAL_TABLET | Freq: Four times a day (QID) | ORAL | Status: DC | PRN
  Administered 2023-04-15 – 2023-04-17 (×5): 2 via ORAL
  Administered 2023-04-18: 1 via ORAL
  Administered 2023-04-19: 2 via ORAL
  Filled 2023-04-15: qty 2
  Filled 2023-04-15: qty 1
  Filled 2023-04-15 (×6): qty 2

## 2023-04-15 NOTE — Progress Notes (Signed)
PHARMACY - PHYSICIAN COMMUNICATION CRITICAL VALUE ALERT - BLOOD CULTURE IDENTIFICATION (BCID)  Adam Ponce is an 72 y.o. male who presented to [facility] on 04/14/2023 with a chief complaint of toe osteomyelitis with abscess, rhabdomyolysis and possible UTI.  Assessment:  Blood cultures 1 of 4 anaerobic growing MSSA. Given osteomyelitis and abscess on MRI, will hold off on narrowing antibiotics for now.     Current antibiotics: Cefepime, metronidazole, vancomycin   Changes to prescribed antibiotics recommended:  Patient is on recommended antibiotics - No changes needed  No results found for this or any previous visit.   Alphia Moh, PharmD, BCPS, BCCP Clinical Pharmacist  Please check AMION for all [facility] phone numbers After 10:00 PM, call Main Pharmacy [phone]

## 2023-04-15 NOTE — Inpatient Diabetes Management (Signed)
Inpatient Diabetes Program Recommendations  AACE/ADA: New Consensus Statement on Inpatient Glycemic Control (2015)  Target Ranges:  Prepandial:   less than 140 mg/dL      Peak postprandial:   less than 180 mg/dL (1-2 hours)      Critically ill patients:  140 - 180 mg/dL    Latest Reference Range & Units 04/15/23 04:27 04/15/23 07:43 04/15/23 13:07  Glucose-Capillary 70 - 99 mg/dL 308 (H)  3 units Novolog  224 (H)  3 units Novolog  231 (H)  3 units Novolog   (H): Data is abnormally high   Home DM Meds: Glipizide 10 mg daily        Lantus 16 units at bedtime        Metformin 500 mg BID        Ozempic 2 mg Qweek  Current Orders: Novolog Sensitive Correction Scale/ SSI (0-9 units) Q4 hours     MD- Note Novolog SSI started this AM  CBGs >200--takes Lantus insulin at home  Please consider starting Semglee 10 units at bedtime (2/3 home dose)    --Will follow patient during hospitalization--  Ambrose Finland RN, MSN, CDCES Diabetes Coordinator Inpatient Glycemic Control Team Team Pager: [phone] (8a-5p)

## 2023-04-15 NOTE — ED Provider Notes (Signed)
[place] EMERGENCY DEPARTMENT AT [facility] Provider Note   CSN: [id number] Arrival date & time: 04/14/23  1711     History  Chief Complaint  Patient presents with   Adam Ponce is a 72 y.o. male.  HPI    72 year old male comes in with chief complaint of fall.  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  According the patient, he fell down because his body gave out earlier today.  One of the maintenance staff found him on the floor and called 911.  Patient just feels profoundly weak.  He denies any fevers, chills, URI-like symptoms.  He states that the left foot is worse appearing than usual.  Patient got his care at the [place] recently for amputation.  Home Medications Prior to Admission medications   Medication Sig Start Date End Date Taking? Authorizing Provider  albuterol (PROVENTIL HFA;VENTOLIN HFA) 108 (90 Base) MCG/ACT inhaler Inhale into the lungs every 6 (six) hours as needed for wheezing or shortness of breath.    [provider]  Azelastine HCl 137 MCG/SPRAY SOLN Place into the nose.    [provider]  bacitracin 500 UNIT/GM ointment Apply 1 Application topically 2 (two) times daily. Small amount    [provider]  benzonatate (TESSALON) 100 MG capsule Take 100 mg by mouth every 8 (eight) hours as needed for cough.    [provider]  betamethasone valerate (VALISONE) 0.1 % cream Apply topically 2 (two) times daily.    [provider]  cetirizine (ZYRTEC) 10 MG tablet Take 10 mg by mouth daily.    [provider]  Cholecalciferol (VITAMIN D3) 10000 units TABS Take by mouth.    [provider]  ciprofloxacin-dexamethasone (CIPRODEX) otic suspension 4 drops 2 (two) times daily.    [provider]  cyanocobalamin  (,VITAMIN B-12,) 1000 MCG/ML injection Inject 1,000 mcg into the muscle once.    [provider]  darunavir (PREZISTA) 800 MG tablet Take 800 mg by mouth.    [provider]  docusate sodium (COLACE) 100 MG capsule Take 100 mg by mouth 2 (two) times daily.    [provider]  EMTRICITABINE-TENOFOVIR AF PO Take by mouth.    [provider]  ezetimibe (ZETIA) 10 MG tablet Take 10 mg by mouth daily.    [provider]  famotidine (PEPCID) 20 MG tablet Take 20 mg by mouth 2 (two) times daily.    [provider]  fenofibrate (TRICOR) 145 MG tablet Take 145 mg by mouth daily.    [provider]  glipiZIDE (GLUCOTROL) 10 MG tablet Take 10 mg by mouth daily before breakfast.    [provider]  guaiFENesin (MUCINEX) 600 MG 12 hr tablet Take 600 mg by mouth every 12 (twelve) hours as needed for cough (congestion).    [provider]  hydrocerin (EUCERIN) CREA Apply 1 Application topically as needed.    [provider]  INSULIN GLARGINE [place] Inject into the skin.    [provider]  ipratropium (ATROVENT) 0.06 % nasal spray Place 2 sprays into both nostrils 3 (three) times daily as needed for rhinitis.    [provider]  lisinopril (PRINIVIL,ZESTRIL) 40 MG tablet Take 40 mg by mouth daily.    [provider]  metFORMIN (GLUCOPHAGE) 1000 MG tablet Take 1,000 mg by mouth 2 (two) times daily with a meal.    [provider]  pravastatin (PRAVACHOL) 80 MG tablet Take 80 mg by mouth daily.    [provider]  pregabalin (LYRICA) 100 MG capsule Take 100 mg by mouth 2 (two) times daily.    [provider]  ritonavir (NORVIR) 100 MG TABS tablet Take by mouth.    [provider]  SEMAGLUTIDE, 1 MG/DOSE, [place] Inject 1 mg into the skin once a week.    [provider]  sennosides-docusate sodium (SENOKOT-S) 8.6-50 MG tablet Take 1 tablet by mouth daily.     [provider]  sodium chloride (OCEAN) 0.65 % nasal spray Place 1 spray into the nose 4 (four) times daily as needed for congestion.    [provider]  traMADol (ULTRAM) 50 MG tablet Take by mouth every 6 (six) hours as needed.    [provider]  triamcinolone cream (KENALOG) 0.1 % Apply 1 Application topically daily as needed (itching on ears).    [provider]  VALACYCLOVIR HCL PO Take 1 g by mouth in the morning and at bedtime. As needed for 5 days (for outbreaks)    [provider]  warfarin (COUMADIN) 5 MG tablet Take 7.5 mg by mouth daily.    [provider]      Allergies    Ciprofloxacin, Ciprofloxacin-dexamethasone, and Codeine    Review of Systems   Review of Systems  All other systems reviewed and are negative.   Physical Exam Updated Vital Signs BP (!) 146/70 (BP Location: Right Arm)   Pulse 87   Temp 98.8 F (37.1 C) (Oral)   Resp 17   SpO2 94%  Physical Exam Vitals and nursing note reviewed.  Constitutional:      Appearance: He is well-developed.  HENT:     Head: Atraumatic.  Cardiovascular:     Rate and Rhythm: Normal rate.  Pulmonary:     Effort: Pulmonary effort is normal.  Musculoskeletal:     Cervical back: Neck supple.  Skin:    General: Skin is warm.     Findings: Erythema present.     Comments: Left second toe is status post amputation.  No drainage noted. Patient has erythema and edema of the left foot, mild warmth to touch  Neurological:     Mental Status: He is alert and oriented to person, place, and time.     ED Results / Procedures / Treatments   Labs (all labs ordered are listed, but only abnormal results are displayed) Labs Reviewed  CBC WITH DIFFERENTIAL/PLATELET - Abnormal; Notable for the following components:      Result Value   WBC 15.3 (*)    Neutro Abs 11.9 (*)    Monocytes Absolute 1.4 (*)    Abs Immature Granulocytes 0.13 (*)    All other components within normal  limits  COMPREHENSIVE METABOLIC PANEL - Abnormal; Notable for the following components:   Sodium 133 (*)    CO2 21 (*)    Glucose, Bld 247 (*)    BUN 25 (*)    Creatinine, Ser 2.34 (*)    Total Protein 8.2 (*)    GFR, Estimated 29 (*)    All other components within normal limits  CULTURE, BLOOD (ROUTINE X 2)  CULTURE, BLOOD (ROUTINE X 2)  PROTIME-INR  APTT  SEDIMENTATION RATE  C-REACTIVE PROTEIN  CK  I-STAT CG4 LACTIC ACID, ED  EKG None  Radiology CT Head Wo Contrast Result Date: 04/14/2023 CLINICAL DATA:  Head trauma, minor (Age >= 65y); Neck trauma (Age >= 65y) EXAM: CT HEAD WITHOUT CONTRAST CT CERVICAL SPINE WITHOUT CONTRAST TECHNIQUE: Multidetector CT imaging of the head and cervical spine was performed following the standard protocol without intravenous contrast. Multiplanar CT image reconstructions of the cervical spine were also generated. RADIATION DOSE REDUCTION: This exam was performed according to the departmental dose-optimization program which includes automated exposure control, adjustment of the mA and/or kV according to patient size and/or use of iterative reconstruction technique. COMPARISON:  None Available. FINDINGS: CT HEAD FINDINGS Brain: Patchy and confluent areas of decreased attenuation are noted throughout the deep and periventricular white matter of the cerebral hemispheres bilaterally, compatible with chronic microvascular ischemic disease. No evidence of large-territorial acute infarction. No parenchymal hemorrhage. No mass lesion. No extra-axial collection. No mass effect or midline shift. No hydrocephalus. Basilar cisterns are patent. Empty sella. Vascular: No hyperdense vessel. Atherosclerotic calcifications are present within the cavernous internal carotid arteries. Skull: No acute fracture or focal lesion. Sinuses/Orbits: Right mastoidectomy. Left mastoid effusion. Paranasal sinuses are clear. The orbits are unremarkable. Other: None. CT CERVICAL SPINE  FINDINGS Alignment: Grade 1 anterolisthesis of C3 on C4 and C4 on C5. Skull base and vertebrae: Multilevel severe degenerative changes of the spine. Associated severe right C2-C3, left C3-C4, right C4-C5 osseous neural foraminal stenosis. No acute fracture. No aggressive appearing focal osseous lesion or focal pathologic process. Soft tissues and spinal canal: No prevertebral fluid or swelling. No visible canal hematoma. Upper chest: Unremarkable. Other: None. IMPRESSION: 1. No acute intracranial abnormality. 2. No acute displaced fracture or traumatic listhesis of the cervical spine. 3. Multilevel severe degenerative changes of the spine. Associated severe right C2-C3, left C3-C4, right C4-C5 osseous neural foraminal stenosis. 4. Empty sella. Findings is often a normal anatomic variant but can be associated with idiopathic intracranial hypertension (pseudotumor cerebri). Electronically Signed   By: Tish Frederickson M.D.   On: 04/14/2023 19:43   CT Cervical Spine Wo Contrast Result Date: 04/14/2023 CLINICAL DATA:  Head trauma, minor (Age >= 65y); Neck trauma (Age >= 65y) EXAM: CT HEAD WITHOUT CONTRAST CT CERVICAL SPINE WITHOUT CONTRAST TECHNIQUE: Multidetector CT imaging of the head and cervical spine was performed following the standard protocol without intravenous contrast. Multiplanar CT image reconstructions of the cervical spine were also generated. RADIATION DOSE REDUCTION: This exam was performed according to the departmental dose-optimization program which includes automated exposure control, adjustment of the mA and/or kV according to patient size and/or use of iterative reconstruction technique. COMPARISON:  None Available. FINDINGS: CT HEAD FINDINGS Brain: Patchy and confluent areas of decreased attenuation are noted throughout the deep and periventricular white matter of the cerebral hemispheres bilaterally, compatible with chronic microvascular ischemic disease. No evidence of large-territorial acute  infarction. No parenchymal hemorrhage. No mass lesion. No extra-axial collection. No mass effect or midline shift. No hydrocephalus. Basilar cisterns are patent. Empty sella. Vascular: No hyperdense vessel. Atherosclerotic calcifications are present within the cavernous internal carotid arteries. Skull: No acute fracture or focal lesion. Sinuses/Orbits: Right mastoidectomy. Left mastoid effusion. Paranasal sinuses are clear. The orbits are unremarkable. Other: None. CT CERVICAL SPINE FINDINGS Alignment: Grade 1 anterolisthesis of C3 on C4 and C4 on C5. Skull base and vertebrae: Multilevel severe degenerative changes of the spine. Associated severe right C2-C3, left C3-C4, right C4-C5 osseous neural foraminal stenosis. No acute fracture. No aggressive appearing focal osseous lesion or focal pathologic process. Soft tissues  and spinal canal: No prevertebral fluid or swelling. No visible canal hematoma. Upper chest: Unremarkable. Other: None. IMPRESSION: 1. No acute intracranial abnormality. 2. No acute displaced fracture or traumatic listhesis of the cervical spine. 3. Multilevel severe degenerative changes of the spine. Associated severe right C2-C3, left C3-C4, right C4-C5 osseous neural foraminal stenosis. 4. Empty sella. Findings is often a normal anatomic variant but can be associated with idiopathic intracranial hypertension (pseudotumor cerebri). Electronically Signed   By: Tish Frederickson M.D.   On: 04/14/2023 19:43   DG Ankle Complete Left Result Date: 04/14/2023 CLINICAL DATA:  Knee gave way patient fell. EXAM: LEFT ANKLE COMPLETE - 3+ VIEW COMPARISON:  None Available. FINDINGS: Mild swelling about the left ankle. No acute fracture or dislocation. Degenerative changes about the midfoot. IMPRESSION: Mild swelling about the left ankle without acute fracture. Electronically Signed   By: Minerva Fester M.D.   On: 04/14/2023 19:30   DG Knee Complete 4 Views Right Result Date: 04/14/2023 CLINICAL DATA:   Fall after knee scaphoid EXAM: LEFT KNEE - COMPLETE 4+ VIEW; RIGHT KNEE - COMPLETE 4+ VIEW COMPARISON:  None Available. FINDINGS: No acute fracture or dislocation. 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 There is moderate to advanced medial compartment narrowing on the left and moderate to advanced medial and lateral compartment narrowing on the right. No knee joint effusion. Vascular calcifications. IMPRESSION: 1. No acute fracture or dislocation. 2. 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 Electronically Signed   By: Minerva Fester M.D.   On: 04/14/2023 19:28   DG Knee Complete 4 Views Left Result Date: 04/14/2023 CLINICAL DATA:  Fall after knee scaphoid EXAM: LEFT KNEE - COMPLETE 4+ VIEW; RIGHT KNEE - COMPLETE 4+ VIEW COMPARISON:  None Available. FINDINGS: No acute fracture or dislocation. 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 There is moderate to advanced medial compartment narrowing on the left and moderate to advanced medial and lateral compartment narrowing on the right. No knee joint effusion. Vascular calcifications. IMPRESSION: 1. No acute fracture or dislocation. 2. 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 Electronically Signed   By: Minerva Fester M.D.   On: 04/14/2023 19:28   DG Ankle Complete Right Result Date: 04/14/2023 CLINICAL DATA:  Ankle pain following fall, initial encounter EXAM: RIGHT ANKLE - COMPLETE 3+ VIEW COMPARISON:  None Available. FINDINGS: No acute fracture or dislocation is noted. Flattening of the plantar arch is noted. Tarsal degenerative changes are seen. IMPRESSION: Chronic changes without acute abnormality. Electronically Signed   By: Alcide Clever M.D.   On: 04/14/2023 19:25   DG Hip Unilat With Pelvis 2-3 Views Left Result Date: 04/14/2023 CLINICAL DATA:  Hip pain after fall. EXAM: DG HIP (WITH OR WITHOUT PELVIS) 2-3V RIGHT; DG HIP (WITH OR WITHOUT PELVIS) 2-3V LEFT COMPARISON:  None Available. FINDINGS:  Bilateral THA. No radiographic evidence of loosening. No acute fracture or dislocation. Heterotopic ossification about both proximal femurs. Pelvic phleboliths. IMPRESSION: Negative. Electronically Signed   By: Minerva Fester M.D.   On: 04/14/2023 19:25   DG Hip Unilat With Pelvis 2-3 Views Right Result Date: 04/14/2023 CLINICAL DATA:  Hip pain after fall. EXAM: DG HIP (WITH OR WITHOUT PELVIS) 2-3V RIGHT; DG HIP (WITH OR WITHOUT PELVIS) 2-3V LEFT COMPARISON:  None Available. FINDINGS: Bilateral THA. No radiographic evidence of loosening. No acute fracture or dislocation. Heterotopic ossification about both proximal femurs. Pelvic phleboliths. IMPRESSION: Negative. Electronically Signed   By: Minerva Fester M.D.   On: 04/14/2023 19:25    Procedures .Critical Care  Performed by: Derwood Kaplan, MD Authorized by: Derwood Kaplan, MD   Critical care provider statement:    Critical care time (minutes):  48   Critical care was necessary to treat or prevent imminent or life-threatening deterioration of the following conditions:  Sepsis   Critical care was time spent personally by me on the following activities:  Development of treatment plan with patient or surrogate, discussions with consultants, evaluation of patient's response to treatment, examination of patient, ordering and review of laboratory studies, ordering and review of radiographic studies, ordering and performing treatments and interventions, pulse oximetry, re-evaluation of patient's condition, review of old charts and obtaining history from patient or surrogate     Medications Ordered in ED Medications  ceFEPIme (MAXIPIME) 2 g in sodium chloride 0.9 % 100 mL IVPB (has no administration in time range)  metroNIDAZOLE (FLAGYL) IVPB 500 mg (has no administration in time range)  vancomycin (VANCOCIN) IVPB 1000 mg/200 mL premix (has no administration in time range)  sodium chloride 0.9 % bolus 1,000 mL (has no administration in time  range)    ED Course/ Medical Decision Making/ A&P                                 Medical Decision Making Amount and/or Complexity of Data Reviewed Labs: ordered. Radiology: ordered.  Risk Prescription drug management.   72 year old male comes in with chief complaint of weakness send related fall.  He has a history of HIV, dementia, A-fib on Coumadin.  He also has vascular pathology and has had toe amputations of both of his feet, it appears that the left toe was amputated more recently.  He gets his care at the [place].  I reviewed patient's records including Care Everywhere records, where the [place] documents can be seen.  Differential diagnosis for this patient includes cellulitis, osteomyelitis, sepsis, severe electrolyte abnormality, rhabdomyolysis, AKI.  Appropriate lab workup has been ordered. Initial lab workup reveals elevated white count and elevated creatinine.  We will activate code sepsis as I suspect that most likely he is having infection of his foot.  Additional labs including CK has been ordered.  Patient's care has been signed out to incoming team.  Patient will need medicine admission.  Final Clinical Impression(s) / ED Diagnoses Final diagnoses:  None    Rx / DC Orders ED Discharge Orders     None         Derwood Kaplan, MD 04/15/23 [phone]

## 2023-04-15 NOTE — Consult Note (Signed)
WOC Nurse Consult Note: Reason for Consult:foot wound Patient from home after fall, unclear down time. Treated at [facility] in [place] for left 2nd toe wound, s/p amputation reported 2 years ago in notes Wound type: full thickness ulcer in the presence of DM Pressure Injury POA: NA Measurement: see nursing flow sheets Wound [id number] Drainage (amount, consistency, odor) yellow noted in images Periwound: intact  Dressing procedure/placement/frequency: Cleanse left plantar foot wound (2nd toe) with Vashe [place] # [phone]), pat dry. Tuck strip of silver hydrofiber (Aquacel Ag+) [place] [phone] into wound bed, serving as wick for drainage. Top with foam. Change every other day.  Offload wound at all times.   MRI pending; consider consultation with orthopedics if osteomyelitis present; however [facility].   Re consult if needed, will not follow at this time. Thanks  Fayola Meckes [employer], RN,CWOCN, CNS, CWON-AP [phone])

## 2023-04-15 NOTE — Sepsis Progress Note (Signed)
Following for sepsis monitoring ?

## 2023-04-15 NOTE — ED Notes (Signed)
Sterile nonstick dressing applied to Lle second toe

## 2023-04-15 NOTE — Progress Notes (Signed)
Patient arrived to unit from ED. Patient alert and oriented x3, disoriented to situation. Patient was placed on tele. Safety importance was discussed with patient, he verbalized understanding. Patient c/o 10/10 generalized pain, given pain medication per PRN order with relief of sx. Patient was given call bell, is in reach.

## 2023-04-15 NOTE — Progress Notes (Signed)
  Echocardiogram 2D Echocardiogram has been performed.  Ocie Doyne RDCS 04/15/2023, 11:09 AM

## 2023-04-15 NOTE — ED Notes (Signed)
ED TO INPATIENT HANDOFF REPORT  ED Nurse Name and Phone #: Richarda Osmond Name/Age/Gender Adam Ponce 72 y.o. male Room/Bed: 036C/036C  Code Status   Code Status: Do not attempt resuscitation (DNR) PRE-ARREST INTERVENTIONS DESIRED  Home/SNF/Other Home Patient oriented to: self, place, time, and situation Is this baseline? Yes   Triage Complete: Triage complete  Chief Complaint Diabetic foot infection (HCC) [U04.540, L08.9]  Triage Note Pt is coming in from home, he is coming in for a fall in which he did not hit his head, with no LOC, He states that the fall occurred awhile away, was found by some carpet cleaners bare naked in the floor. He states he fell because his knees gave way. Pt has no other complaints at this time. He has eaten and taken his medications today before the alleged fall.   Medic vitals   148/114 91hr 20rr 96%ra 327bgl    Allergies Allergies  Allergen Reactions   Ciprofloxacin Other (See Comments)    unknown   Ciprofloxacin-Dexamethasone Other (See Comments)    unknown   Codeine     Level of Care/Admitting Diagnosis ED Disposition     ED Disposition  Admit   Condition  --   Comment  Hospital Area: [facility] [[id number]]  Level of Care: Telemetry Medical [104]  May admit patient to [facility] or [facility] if equivalent level of care is available:: Yes  Covid Evaluation: Asymptomatic - no recent exposure (last 10 days) testing not required  Diagnosis: Diabetic foot infection [facility]) [[id number]]  Admitting Physician: John Giovanni [[id number]]  Attending Physician: John Giovanni [[id number]]  Certification:: I certify this patient will need inpatient services for at least 2 midnights  Expected Medical Readiness: 04/18/2023          B Medical/Surgery History Past Medical History:  Diagnosis Date   Arthritis    Asthma    Constipation    Diabetes mellitus    Heart disease    HIV disease (HCC) 04/19/1993    Hypertension    Past Surgical History:  Procedure Laterality Date   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   JOINT REPLACEMENT  2010   Bilateral hip     A IV Location/Drains/Wounds Patient Lines/Drains/Airways Status     Active Line/Drains/Airways     Name Placement date Placement time Site Days   Peripheral IV 04/15/23 Posterior;Right Hand 04/15/23  0128  Hand  less than 1            Intake/Output Last 24 hours  Intake/Output Summary (Last 24 hours) at 04/15/2023 1214 Last data filed at 04/15/2023 0445 Gross per 24 hour  Intake 1397.4 ml  Output --  Net 1397.4 ml    Labs/Imaging Results for orders placed or performed during the hospital encounter of 04/14/23 (from the past 48 hours)  CBC with Differential     Status: Abnormal   Collection Time: 04/14/23  9:16 PM  Result Value Ref Range   WBC 15.3 (H) 4.0 - 10.5 K/uL   RBC 4.55 4.22 - 5.81 MIL/uL   Hemoglobin 14.3 13.0 - 17.0 g/dL   HCT 78.4 69.6 - 29.5 %   MCV 93.8 80.0 - 100.0 fL   MCH 31.4 26.0 - 34.0 pg   MCHC 33.5 30.0 - 36.0 g/dL   RDW 28.4 13.2 - 44.0 %   Platelets 238 150 - 400 K/uL   nRBC 0.0 0.0 - 0.2 %   Neutrophils Relative % 78 %   Neutro  Abs 11.9 (H) 1.7 - 7.7 K/uL   Lymphocytes Relative 12 %   Lymphs Abs 1.9 0.7 - 4.0 K/uL   Monocytes Relative 9 %   Monocytes Absolute 1.4 (H) 0.1 - 1.0 K/uL   Eosinophils Relative 0 %   Eosinophils Absolute 0.0 0.0 - 0.5 K/uL   Basophils Relative 0 %   Basophils Absolute 0.0 0.0 - 0.1 K/uL   Immature Granulocytes 1 %   Abs Immature Granulocytes 0.13 (H) 0.00 - 0.07 K/uL    Comment: Performed at [facility] Lab, [address] [place], [place] [address]  Comprehensive metabolic panel     Status: Abnormal   Collection Time: 04/14/23  9:16 PM  Result Value Ref Range   Sodium 133 (L) 135 - 145 mmol/L   Potassium 4.8 3.5 - 5.1 mmol/L   Chloride 100 98 - 111 mmol/L   CO2 21 (L) 22 - 32 mmol/L   Glucose, Bld 247 (H) 70 - 99 mg/dL    Comment: Glucose  reference range applies only to samples taken after fasting for at least 8 hours.   BUN 25 (H) 8 - 23 mg/dL   Creatinine, Ser 6.04 (H) 0.61 - 1.24 mg/dL   Calcium 9.8 8.9 - 54.0 mg/dL   Total Protein 8.2 (H) 6.5 - 8.1 g/dL   Albumin 3.6 3.5 - 5.0 g/dL   AST 39 15 - 41 U/L   ALT 22 0 - 44 U/L   Alkaline Phosphatase 55 38 - 126 U/L   Total Bilirubin 0.8 <1.2 mg/dL   GFR, Estimated 29 (L) >60 mL/min    Comment: (NOTE) Calculated using the CKD-EPI Creatinine Equation (2021)    Anion gap 12 5 - 15    Comment: Performed at [facility], [address] [place], [place] [address]  Urinalysis, Routine w reflex microscopic -Urine, Clean Catch     Status: Abnormal   Collection Time: 04/15/23 12:14 AM  Result Value Ref Range   Color, Urine YELLOW YELLOW   APPearance HAZY (A) CLEAR   Specific Gravity, Urine 1.015 1.005 - 1.030   pH 5.0 5.0 - 8.0   Glucose, UA 50 (A) NEGATIVE mg/dL   Hgb urine dipstick LARGE (A) NEGATIVE   Bilirubin Urine NEGATIVE NEGATIVE   Ketones, ur NEGATIVE NEGATIVE mg/dL   Protein, ur 147 (A) NEGATIVE mg/dL   Nitrite NEGATIVE NEGATIVE   Leukocytes,Ua SMALL (A) NEGATIVE   RBC / HPF 6-10 0 - 5 RBC/hpf   WBC, UA >50 0 - 5 WBC/hpf   Bacteria, UA MANY (A) NONE SEEN   Squamous Epithelial / HPF 6-10 0 - 5 /HPF   Mucus PRESENT     Comment: Performed at [facility] Lab, [address] [place], [place] [address]  Protime-INR     Status: None   Collection Time: 04/15/23  1:07 AM  Result Value Ref Range   Prothrombin Time 14.9 11.4 - 15.2 seconds   INR 1.2 0.8 - 1.2    Comment: (NOTE) INR goal varies based on device and disease states. Performed at [facility] Lab, [address] [place], [place] [address]   APTT     Status: None   Collection Time: 04/15/23  1:07 AM  Result Value Ref Range   aPTT 31 24 - 36 seconds    Comment: Performed at [facility], [address] [place], [place] [address]  Blood Culture (routine x 2)     Status: None  (Preliminary  result)   Collection Time: 04/15/23  1:07 AM   Specimen: BLOOD RIGHT HAND  Result Value Ref Range   Specimen Description BLOOD RIGHT HAND    Special Requests      BOTTLES DRAWN AEROBIC AND ANAEROBIC Blood Culture adequate volume   Culture      NO GROWTH < 12 HOURS Performed at [facility] Lab, [address], [place], [place] [address]    Report Status PENDING   Blood Culture (routine x 2)     Status: None (Preliminary result)   Collection Time: 04/15/23  1:07 AM   Specimen: BLOOD RIGHT HAND  Result Value Ref Range   Specimen Description BLOOD RIGHT HAND    Special Requests      BOTTLES DRAWN AEROBIC AND ANAEROBIC Blood Culture adequate volume   Culture      NO GROWTH < 12 HOURS Performed at [facility] Lab, [address], [place], [place] [address]    Report Status PENDING   Sedimentation rate     Status: Abnormal   Collection Time: 04/15/23  1:07 AM  Result Value Ref Range   Sed Rate 91 (H) 0 - 16 mm/hr    Comment: Performed at [facility], [address], [place], [place] [address]  C-reactive protein     Status: Abnormal   Collection Time: 04/15/23  1:07 AM  Result Value Ref Range   CRP 18.4 (H) <1.0 mg/dL    Comment: Performed at [facility] Lab, [address] [place], [place] [address]  CK     Status: Abnormal   Collection Time: 04/15/23  1:07 AM  Result Value Ref Range   Total CK 2,253 (H) 49 - 397 U/L    Comment: Performed at [facility] Lab, [address], [place], [place] [address]  CBC     Status: Abnormal   Collection Time: 04/15/23  1:07 AM  Result Value Ref Range   WBC 15.4 (H) 4.0 - 10.5 K/uL   RBC 4.28 4.22 - 5.81 MIL/uL   Hemoglobin 13.7 13.0 - 17.0 g/dL   HCT 86.5 78.4 - 69.6 %   MCV 93.5 80.0 - 100.0 fL   MCH 32.0 26.0 - 34.0 pg   MCHC 34.3 30.0 - 36.0 g/dL   RDW 29.5 28.4 - 13.2 %   Platelets 224 150 - 400 K/uL   nRBC 0.0 0.0 - 0.2 %    Comment: Performed at [facility] Lab, [address],  [place], [place] [address]  Basic metabolic panel     Status: Abnormal   Collection Time: 04/15/23  1:07 AM  Result Value Ref Range   Sodium 131 (L) 135 - 145 mmol/L   Potassium 4.4 3.5 - 5.1 mmol/L   Chloride 98 98 - 111 mmol/L   CO2 19 (L) 22 - 32 mmol/L   Glucose, Bld 266 (H) 70 - 99 mg/dL    Comment: Glucose reference range applies only to samples taken after fasting for at least 8 hours.   BUN 27 (H) 8 - 23 mg/dL   Creatinine, Ser 2.72 (H) 0.61 - 1.24 mg/dL   Calcium 9.3 8.9 - 53.6 mg/dL   GFR, Estimated 32 (L) >60 mL/min    Comment: (NOTE) Calculated using the CKD-EPI Creatinine Equation (2021)    Anion gap 14 5 - 15    Comment: Performed at [facility] Lab, [address] [place], [place] [address]  Brain natriuretic peptide  Status: Abnormal   Collection Time: 04/15/23  1:07 AM  Result Value Ref Range   B Natriuretic Peptide 275.4 (H) 0.0 - 100.0 pg/mL    Comment: Performed at [facility] Lab, [address], [place], [place] [address]  I-Stat Lactic Acid, ED     Status: None   Collection Time: 04/15/23  1:15 AM  Result Value Ref Range   Lactic Acid, Venous 1.3 0.5 - 1.9 mmol/L  I-Stat Lactic Acid, ED     Status: None   Collection Time: 04/15/23  3:02 AM  Result Value Ref Range   Lactic Acid, Venous 1.3 0.5 - 1.9 mmol/L  CBG monitoring, ED     Status: Abnormal   Collection Time: 04/15/23  4:27 AM  Result Value Ref Range   Glucose-Capillary 239 (H) 70 - 99 mg/dL    Comment: Glucose reference range applies only to samples taken after fasting for at least 8 hours.  CBG monitoring, ED     Status: Abnormal   Collection Time: 04/15/23  7:43 AM  Result Value Ref Range   Glucose-Capillary 224 (H) 70 - 99 mg/dL    Comment: Glucose reference range applies only to samples taken after fasting for at least 8 hours.   DG Toe 2nd Left Result Date: 04/15/2023 CLINICAL DATA:  Evaluate for osteomyelitis.  Fall. EXAM: LEFT SECOND TOE COMPARISON:  None Available. 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 No acute bony abnormality. Specifically, no fracture, subluxation, or dislocation. No bone destruction to suggest osteomyelitis. Degenerative changes in the midfoot. IMPRESSION: No acute bony abnormality. Electronically Signed   By: Charlett Nose M.D.   On: 04/15/2023 00:51   DG Chest Port 1 View Result Date: 04/15/2023 CLINICAL DATA:  Questionable sepsis - evaluate for abnormality. Fall. EXAM: PORTABLE CHEST 1 VIEW COMPARISON:  05/13/2009 FINDINGS: Left pacer in place with leads in the right atrium and right ventricle. Cardiomegaly, vascular congestion. No confluent opacity, overt edema or effusions. No pneumothorax. No acute bony abnormality. IMPRESSION: Cardiomegaly, vascular congestion. Electronically Signed   By: Charlett Nose M.D.   On: 04/15/2023 00:48   CT Head Wo Contrast Result Date: 04/14/2023 CLINICAL DATA:  Head trauma, minor (Age >= 65y); Neck trauma (Age >= 65y) EXAM: CT HEAD WITHOUT CONTRAST CT CERVICAL SPINE WITHOUT CONTRAST TECHNIQUE: Multidetector CT imaging of the head and cervical spine was performed following the standard protocol without intravenous contrast. Multiplanar CT image reconstructions of the cervical spine were also generated. RADIATION DOSE REDUCTION: This exam was performed according to the departmental dose-optimization program which includes automated exposure control, adjustment of the mA and/or kV according to patient size and/or use of iterative reconstruction technique. COMPARISON:  None Available. FINDINGS: CT HEAD FINDINGS Brain: Patchy and confluent areas of decreased attenuation are noted throughout the deep and periventricular white matter of the cerebral hemispheres bilaterally, compatible with chronic microvascular ischemic disease. No evidence of large-territorial acute infarction. No parenchymal hemorrhage. No mass lesion. No extra-axial collection. No mass effect or midline shift. No hydrocephalus.  Basilar cisterns are patent. Empty sella. Vascular: No hyperdense vessel. Atherosclerotic calcifications are present within the cavernous internal carotid arteries. Skull: No acute fracture or focal lesion. Sinuses/Orbits: Right mastoidectomy. Left mastoid effusion. Paranasal sinuses are clear. The orbits are unremarkable. Other: None. CT CERVICAL SPINE FINDINGS Alignment: Grade 1 anterolisthesis of C3 on C4 and C4 on C5. Skull base and vertebrae: Multilevel severe degenerative changes of the spine. Associated severe right C2-C3, left  C3-C4, right C4-C5 osseous neural foraminal stenosis. No acute fracture. No aggressive appearing focal osseous lesion or focal pathologic process. Soft tissues and spinal canal: No prevertebral fluid or swelling. No visible canal hematoma. Upper chest: Unremarkable. Other: None. IMPRESSION: 1. No acute intracranial abnormality. 2. No acute displaced fracture or traumatic listhesis of the cervical spine. 3. Multilevel severe degenerative changes of the spine. Associated severe right C2-C3, left C3-C4, right C4-C5 osseous neural foraminal stenosis. 4. Empty sella. Findings is often a normal anatomic variant but can be associated with idiopathic intracranial hypertension (pseudotumor cerebri). Electronically Signed   By: Tish Frederickson M.D.   On: 04/14/2023 19:43   CT Cervical Spine Wo Contrast Result Date: 04/14/2023 CLINICAL DATA:  Head trauma, minor (Age >= 65y); Neck trauma (Age >= 65y) EXAM: CT HEAD WITHOUT CONTRAST CT CERVICAL SPINE WITHOUT CONTRAST TECHNIQUE: Multidetector CT imaging of the head and cervical spine was performed following the standard protocol without intravenous contrast. Multiplanar CT image reconstructions of the cervical spine were also generated. RADIATION DOSE REDUCTION: This exam was performed according to the departmental dose-optimization program which includes automated exposure control, adjustment of the mA and/or kV according to patient size and/or  use of iterative reconstruction technique. COMPARISON:  None Available. FINDINGS: CT HEAD FINDINGS Brain: Patchy and confluent areas of decreased attenuation are noted throughout the deep and periventricular white matter of the cerebral hemispheres bilaterally, compatible with chronic microvascular ischemic disease. No evidence of large-territorial acute infarction. No parenchymal hemorrhage. No mass lesion. No extra-axial collection. No mass effect or midline shift. No hydrocephalus. Basilar cisterns are patent. Empty sella. Vascular: No hyperdense vessel. Atherosclerotic calcifications are present within the cavernous internal carotid arteries. Skull: No acute fracture or focal lesion. Sinuses/Orbits: Right mastoidectomy. Left mastoid effusion. Paranasal sinuses are clear. The orbits are unremarkable. Other: None. CT CERVICAL SPINE FINDINGS Alignment: Grade 1 anterolisthesis of C3 on C4 and C4 on C5. Skull base and vertebrae: Multilevel severe degenerative changes of the spine. Associated severe right C2-C3, left C3-C4, right C4-C5 osseous neural foraminal stenosis. No acute fracture. No aggressive appearing focal osseous lesion or focal pathologic process. Soft tissues and spinal canal: No prevertebral fluid or swelling. No visible canal hematoma. Upper chest: Unremarkable. Other: None. IMPRESSION: 1. No acute intracranial abnormality. 2. No acute displaced fracture or traumatic listhesis of the cervical spine. 3. Multilevel severe degenerative changes of the spine. Associated severe right C2-C3, left C3-C4, right C4-C5 osseous neural foraminal stenosis. 4. Empty sella. Findings is often a normal anatomic variant but can be associated with idiopathic intracranial hypertension (pseudotumor cerebri). Electronically Signed   By: Tish Frederickson M.D.   On: 04/14/2023 19:43   DG Ankle Complete Left Result Date: 04/14/2023 CLINICAL DATA:  Knee gave way patient fell. EXAM: LEFT ANKLE COMPLETE - 3+ VIEW COMPARISON:   None Available. FINDINGS: Mild swelling about the left ankle. No acute fracture or dislocation. Degenerative changes about the midfoot. IMPRESSION: Mild swelling about the left ankle without acute fracture. Electronically Signed   By: Minerva Fester M.D.   On: 04/14/2023 19:30   DG Knee Complete 4 Views Right Result Date: 04/14/2023 CLINICAL DATA:  Fall after knee scaphoid EXAM: LEFT KNEE - COMPLETE 4+ VIEW; RIGHT KNEE - COMPLETE 4+ VIEW COMPARISON:  None Available. FINDINGS: No acute fracture or dislocation. 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 There is moderate to advanced medial compartment narrowing on the left and moderate to advanced medial and lateral compartment narrowing on the right. No knee joint effusion. Vascular  calcifications. IMPRESSION: 1. No acute fracture or dislocation. 2. 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 Electronically Signed   By: Minerva Fester M.D.   On: 04/14/2023 19:28   DG Knee Complete 4 Views Left Result Date: 04/14/2023 CLINICAL DATA:  Fall after knee scaphoid EXAM: LEFT KNEE - COMPLETE 4+ VIEW; RIGHT KNEE - COMPLETE 4+ VIEW COMPARISON:  None Available. FINDINGS: No acute fracture or dislocation. 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 There is moderate to advanced medial compartment narrowing on the left and moderate to advanced medial and lateral compartment narrowing on the right. No knee joint effusion. Vascular calcifications. IMPRESSION: 1. No acute fracture or dislocation. 2. 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 Electronically Signed   By: Minerva Fester M.D.   On: 04/14/2023 19:28   DG Ankle Complete Right Result Date: 04/14/2023 CLINICAL DATA:  Ankle pain following fall, initial encounter EXAM: RIGHT ANKLE - COMPLETE 3+ VIEW COMPARISON:  None Available. FINDINGS: No acute fracture or dislocation is noted. Flattening of the plantar arch is noted. Tarsal degenerative changes are seen.  IMPRESSION: Chronic changes without acute abnormality. Electronically Signed   By: Alcide Clever M.D.   On: 04/14/2023 19:25   DG Hip Unilat With Pelvis 2-3 Views Left Result Date: 04/14/2023 CLINICAL DATA:  Hip pain after fall. EXAM: DG HIP (WITH OR WITHOUT PELVIS) 2-3V RIGHT; DG HIP (WITH OR WITHOUT PELVIS) 2-3V LEFT COMPARISON:  None Available. FINDINGS: Bilateral THA. No radiographic evidence of loosening. No acute fracture or dislocation. Heterotopic ossification about both proximal femurs. Pelvic phleboliths. IMPRESSION: Negative. Electronically Signed   By: Minerva Fester M.D.   On: 04/14/2023 19:25   DG Hip Unilat With Pelvis 2-3 Views Right Result Date: 04/14/2023 CLINICAL DATA:  Hip pain after fall. EXAM: DG HIP (WITH OR WITHOUT PELVIS) 2-3V RIGHT; DG HIP (WITH OR WITHOUT PELVIS) 2-3V LEFT COMPARISON:  None Available. FINDINGS: Bilateral THA. No radiographic evidence of loosening. No acute fracture or dislocation. Heterotopic ossification about both proximal femurs. Pelvic phleboliths. IMPRESSION: Negative. Electronically Signed   By: Minerva Fester M.D.   On: 04/14/2023 19:25    Pending Labs Unresulted Labs (From admission, onward)     Start     Ordered   04/15/23 0410  Hemoglobin A1c  Once,   R       Comments: To assess prior glycemic control    04/15/23 0410   04/15/23 0049  Urine Culture  Once,   URGENT       Question:  Indication  Answer:  Dysuria   04/15/23 0048            Vitals/Pain Today's Vitals   04/15/23 0600 04/15/23 0630 04/15/23 0903 04/15/23 0937  BP: (!) 129/55 (!) 106/59  (!) 108/55  Pulse: (!) 59 71  60  Resp: (!) 22 20  15   Temp:   99.4 F (37.4 C)   TempSrc:   Oral   SpO2: 97% 98%  99%  Weight:      Height:      PainSc:        Isolation Precautions No active isolations  Medications Medications  vancomycin (VANCOCIN) IVPB 1000 mg/200 mL premix (1,000 mg Intravenous Not Given 04/15/23 0312)  metroNIDAZOLE (FLAGYL) IVPB 500 mg (0 mg  Intravenous Stopped 04/15/23 1056)  0.9 %  sodium chloride infusion ( Intravenous New Bag/Given 04/15/23 0511)  acetaminophen (TYLENOL) tablet 650 mg (has no administration in time range)    Or  acetaminophen (TYLENOL) suppository 650 mg (has no administration  in time range)  insulin aspart (novoLOG) injection 0-9 Units (3 Units Subcutaneous Given 04/15/23 0941)  ceFEPIme (MAXIPIME) 2 g in sodium chloride 0.9 % 100 mL IVPB (has no administration in time range)  vancomycin (VANCOCIN) IVPB 1000 mg/200 mL premix (has no administration in time range)  darunavir-cobicistat (PREZCOBIX) 800-150 MG per tablet 1 tablet (1 tablet Oral Given 04/15/23 0941)  emtricitabine-tenofovir AF (DESCOVY) 200-25 MG per tablet 1 tablet (1 tablet Oral Given 04/15/23 0941)  perflutren lipid microspheres (DEFINITY) IV suspension (2 mLs Intravenous Given 04/15/23 1109)  ceFEPIme (MAXIPIME) 2 g in sodium chloride 0.9 % 100 mL IVPB (0 g Intravenous Stopped 04/15/23 0344)  metroNIDAZOLE (FLAGYL) IVPB 500 mg (0 mg Intravenous Stopped 04/15/23 0445)  sodium chloride 0.9 % bolus 1,000 mL (0 mLs Intravenous Stopped 04/15/23 0253)  silver sulfADIAZINE (SILVADENE) 1 % cream ( Topical Given 04/15/23 0128)  vancomycin (VANCOCIN) IVPB 1000 mg/200 mL premix (0 mg Intravenous Stopped 04/15/23 0710)    Mobility walks with device     Focused Assessments Neuro Assessment Handoff:  Swallow screen pass? Yes          Neuro Assessment:   Neuro Checks:      Has TPA been given? No If patient is a Neuro Trauma and patient is going to OR before floor call report to 4N Charge nurse: [phone] or [phone]   R Recommendations: See Admitting Provider Note  Report given to:   Additional Notes:

## 2023-04-15 NOTE — Progress Notes (Signed)
Patent admitted after midnight, please see H&P.  Here with infection of foot-- MRI pending to determine if more than abx are needed.  Warfarin appears to be on his med list-- unclear why taken off. Marlin Canary DO

## 2023-04-15 NOTE — ED Notes (Signed)
First lac 1.3

## 2023-04-15 NOTE — ED Provider Notes (Signed)
  Provider Note MRN:  [id number]  Arrival date & time: 04/15/23    ED Course and Medical Decision Making  Assumed care from [place] at shift change.  See note from prior team for complete details, in brief:  72yo male here w/ fall + weakness Hx HIV, DM, prior toe amputation Foot looks worse Fell 2/2 weakness  WBC 15.3 Cr 2.34, baseline wnl Sepsis, likely source foot Outgoing team spoke with hospitalist, recommends further imaging and labs, these were ordered.  Plan per prior physician follow-up remaining labs, admit CK is elevated, continue IV fluids.  He has AKI Urine concerning for infection but is grossly contaminated.  Will send urine culture. Patient with severe sepsis likely due to foot infection, possible concurrent UTI.  Continue broad-spectrum antibiotics.   Will plan admission to hospitalist     .Critical Care  Performed by: Sloan Leiter, DO Authorized by: Sloan Leiter, DO   Critical care provider statement:    Critical care time (minutes):  30   Critical care time was exclusive of:  Separately billable procedures and treating other patients   Critical care was necessary to treat or prevent imminent or life-threatening deterioration of the following conditions:  Sepsis   Critical care was time spent personally by me on the following activities:  Development of treatment plan with patient or surrogate, discussions with consultants, evaluation of patient's response to treatment, examination of patient, ordering and review of laboratory studies, ordering and review of radiographic studies, ordering and performing treatments and interventions, pulse oximetry, re-evaluation of patient's condition, review of old charts and obtaining history from patient or surrogate   Care discussed with: admitting provider     Final Clinical Impressions(s) / ED Diagnoses     ICD-10-CM   1. Severe sepsis (HCC)  A41.9    R65.20     2. Traumatic rhabdomyolysis, initial encounter (HCC)   T79.6XXA     3. AKI (acute kidney injury) [facility])  N17.9       ED Discharge Orders     None       Discharge Instructions   None        Sloan Leiter, DO 04/15/23 [phone]

## 2023-04-15 NOTE — ED Notes (Signed)
Condom catheter placed

## 2023-04-15 NOTE — Progress Notes (Signed)
Pharmacy Antibiotic Note  BARBARA CALCANO is a 72 y.o. male admitted on 04/14/2023 with AMS/fall, LLE cellulitis.  Pharmacy has been consulted for Vancomycin and Cefepime  dosing.  Plan: Vancomycin 2000 mg IV now, then 1000 mg IV q24h Cefepime 2 g IV q12h  Height: 5\' 11"  (180.3 cm) Weight: 126 kg (277 lb 12.5 oz) IBW/kg (Calculated) : 75.3  Temp (24hrs), Avg:98.4 F (36.9 C), Min:98 F (36.7 C), Max:98.8 F (37.1 C)  Recent Labs  Lab 04/14/23 2116 04/15/23 0115 04/15/23 0302  WBC 15.3*  --   --   CREATININE 2.34*  --   --   LATICACIDVEN  --  1.3 1.3    Estimated Creatinine Clearance: 38.6 mL/min (A) (by C-G formula based on SCr of 2.34 mg/dL (H)).    Allergies  Allergen Reactions   Ciprofloxacin Other (See Comments)    unknown   Ciprofloxacin-Dexamethasone Other (See Comments)    unknown   Codeine     Eddie Candle 04/15/2023 4:23 AM

## 2023-04-15 NOTE — ED Notes (Signed)
Condom catheter applied.

## 2023-04-15 NOTE — ED Provider Notes (Incomplete)
[place] EMERGENCY DEPARTMENT AT [facility] Provider Note   CSN: [id number] Arrival date & time: 04/14/23  1711     History {Add pertinent medical, surgical, social history, OB history to HPI:1} Chief Complaint  Patient presents with  . Fall    Adam Ponce is a 72 y.o. male.  HPI    72 year old male comes in with chief complaint of HIV, syphilis, diabetes  Home Medications Prior to Admission medications   Medication Sig Start Date End Date Taking? Authorizing Provider  albuterol (PROVENTIL HFA;VENTOLIN HFA) 108 (90 Base) MCG/ACT inhaler Inhale into the lungs every 6 (six) hours as needed for wheezing or shortness of breath.    [provider]  Azelastine HCl 137 MCG/SPRAY SOLN Place into the nose.    [provider]  bacitracin 500 UNIT/GM ointment Apply 1 Application topically 2 (two) times daily. Small amount    [provider]  benzonatate (TESSALON) 100 MG capsule Take 100 mg by mouth every 8 (eight) hours as needed for cough.    [provider]  betamethasone valerate (VALISONE) 0.1 % cream Apply topically 2 (two) times daily.    [provider]  cetirizine (ZYRTEC) 10 MG tablet Take 10 mg by mouth daily.    [provider]  Cholecalciferol (VITAMIN D3) 10000 units TABS Take by mouth.    [provider]  ciprofloxacin-dexamethasone (CIPRODEX) otic suspension 4 drops 2 (two) times daily.    [provider]  cyanocobalamin (,VITAMIN B-12,) 1000 MCG/ML injection Inject 1,000 mcg into the muscle once.    [provider]  darunavir (PREZISTA) 800 MG tablet Take 800 mg by mouth.    [provider]  docusate sodium (COLACE) 100 MG capsule Take 100 mg by mouth 2 (two) times daily.    [provider]  EMTRICITABINE-TENOFOVIR AF PO Take by mouth.    [provider]  ezetimibe (ZETIA) 10 MG tablet Take 10 mg by mouth daily.    [provider]  famotidine  (PEPCID) 20 MG tablet Take 20 mg by mouth 2 (two) times daily.    [provider]  fenofibrate (TRICOR) 145 MG tablet Take 145 mg by mouth daily.    [provider]  glipiZIDE (GLUCOTROL) 10 MG tablet Take 10 mg by mouth daily before breakfast.    [provider]  guaiFENesin (MUCINEX) 600 MG 12 hr tablet Take 600 mg by mouth every 12 (twelve) hours as needed for cough (congestion).    [provider]  hydrocerin (EUCERIN) CREA Apply 1 Application topically as needed.    [provider]  INSULIN GLARGINE [place] Inject into the skin.    [provider]  ipratropium (ATROVENT) 0.06 % nasal spray Place 2 sprays into both nostrils 3 (three) times daily as needed for rhinitis.    [provider]  lisinopril (PRINIVIL,ZESTRIL) 40 MG tablet Take 40 mg by mouth daily.    [provider]  metFORMIN (GLUCOPHAGE) 1000 MG tablet Take 1,000 mg by mouth 2 (two) times daily with a meal.    [provider]  pravastatin (PRAVACHOL) 80 MG tablet Take 80 mg by mouth daily.    [provider]  pregabalin (LYRICA) 100 MG capsule Take 100 mg by mouth 2 (two) times daily.    [provider]  ritonavir (NORVIR) 100 MG TABS tablet Take by mouth.    [provider]  SEMAGLUTIDE, 1 MG/DOSE, [place] Inject 1 mg into the skin once a week.  [provider]  sennosides-docusate sodium (SENOKOT-S) 8.6-50 MG tablet Take 1 tablet by mouth daily.    [provider]  sodium chloride (OCEAN) 0.65 % nasal spray Place 1 spray into the nose 4 (four) times daily as needed for congestion.    [provider]  traMADol (ULTRAM) 50 MG tablet Take by mouth every 6 (six) hours as needed.    [provider]  triamcinolone cream (KENALOG) 0.1 % Apply 1 Application topically daily as needed (itching on ears).    [provider]  VALACYCLOVIR HCL PO Take 1 g by mouth in the morning and at bedtime. As  needed for 5 days (for outbreaks)    [provider]  warfarin (COUMADIN) 5 MG tablet Take 7.5 mg by mouth daily.    [provider]      Allergies    Ciprofloxacin, Ciprofloxacin-dexamethasone, and Codeine    Review of Systems   Review of Systems  Physical Exam Updated Vital Signs BP (!) 146/70 (BP Location: Right Arm)   Pulse 87   Temp 98.8 F (37.1 C) (Oral)   Resp 17   SpO2 94%  Physical Exam  ED Results / Procedures / Treatments   Labs (all labs ordered are listed, but only abnormal results are displayed) Labs Reviewed  CBC WITH DIFFERENTIAL/PLATELET - Abnormal; Notable for the following components:      Result Value   WBC 15.3 (*)    Neutro Abs 11.9 (*)    Monocytes Absolute 1.4 (*)    Abs Immature Granulocytes 0.13 (*)    All other components within normal limits  COMPREHENSIVE METABOLIC PANEL - Abnormal; Notable for the following components:   Sodium 133 (*)    CO2 21 (*)    Glucose, Bld 247 (*)    BUN 25 (*)    Creatinine, Ser 2.34 (*)    Total Protein 8.2 (*)    GFR, Estimated 29 (*)    All other components within normal limits  CULTURE, BLOOD (ROUTINE X 2)  CULTURE, BLOOD (ROUTINE X 2)  PROTIME-INR  APTT  SEDIMENTATION RATE  C-REACTIVE PROTEIN  CK  I-STAT CG4 LACTIC ACID, ED    EKG None  Radiology CT Head Wo Contrast Result Date: 04/14/2023 CLINICAL DATA:  Head trauma, minor (Age >= 65y); Neck trauma (Age >= 65y) EXAM: CT HEAD WITHOUT CONTRAST CT CERVICAL SPINE WITHOUT CONTRAST TECHNIQUE: Multidetector CT imaging of the head and cervical spine was performed following the standard protocol without intravenous contrast. Multiplanar CT image reconstructions of the cervical spine were also generated. RADIATION DOSE REDUCTION: This exam was performed according to the departmental dose-optimization program which includes automated exposure control, adjustment of the mA and/or kV according to patient size and/or use of iterative  reconstruction technique. COMPARISON:  None Available. FINDINGS: CT HEAD FINDINGS Brain: Patchy and confluent areas of decreased attenuation are noted throughout the deep and periventricular white matter of the cerebral hemispheres bilaterally, compatible with chronic microvascular ischemic disease. No evidence of large-territorial acute infarction. No parenchymal hemorrhage. No mass lesion. No extra-axial collection. No mass effect or midline shift. No hydrocephalus. Basilar cisterns are patent. Empty sella. Vascular: No hyperdense vessel. Atherosclerotic calcifications are present within the cavernous internal carotid arteries. Skull: No acute fracture or focal lesion. Sinuses/Orbits: Right mastoidectomy. Left mastoid effusion. Paranasal sinuses are clear. The orbits are unremarkable. Other: None. CT CERVICAL SPINE FINDINGS Alignment: Grade 1 anterolisthesis of C3 on C4 and C4 on C5. Skull base and vertebrae: Multilevel severe degenerative  changes of the spine. Associated severe right C2-C3, left C3-C4, right C4-C5 osseous neural foraminal stenosis. No acute fracture. No aggressive appearing focal osseous lesion or focal pathologic process. Soft tissues and spinal canal: No prevertebral fluid or swelling. No visible canal hematoma. Upper chest: Unremarkable. Other: None. IMPRESSION: 1. No acute intracranial abnormality. 2. No acute displaced fracture or traumatic listhesis of the cervical spine. 3. Multilevel severe degenerative changes of the spine. Associated severe right C2-C3, left C3-C4, right C4-C5 osseous neural foraminal stenosis. 4. Empty sella. Findings is often a normal anatomic variant but can be associated with idiopathic intracranial hypertension (pseudotumor cerebri). Electronically Signed   By: Tish Frederickson M.D.   On: 04/14/2023 19:43   CT Cervical Spine Wo Contrast Result Date: 04/14/2023 CLINICAL DATA:  Head trauma, minor (Age >= 65y); Neck trauma (Age >= 65y) EXAM: CT HEAD WITHOUT  CONTRAST CT CERVICAL SPINE WITHOUT CONTRAST TECHNIQUE: Multidetector CT imaging of the head and cervical spine was performed following the standard protocol without intravenous contrast. Multiplanar CT image reconstructions of the cervical spine were also generated. RADIATION DOSE REDUCTION: This exam was performed according to the departmental dose-optimization program which includes automated exposure control, adjustment of the mA and/or kV according to patient size and/or use of iterative reconstruction technique. COMPARISON:  None Available. FINDINGS: CT HEAD FINDINGS Brain: Patchy and confluent areas of decreased attenuation are noted throughout the deep and periventricular white matter of the cerebral hemispheres bilaterally, compatible with chronic microvascular ischemic disease. No evidence of large-territorial acute infarction. No parenchymal hemorrhage. No mass lesion. No extra-axial collection. No mass effect or midline shift. No hydrocephalus. Basilar cisterns are patent. Empty sella. Vascular: No hyperdense vessel. Atherosclerotic calcifications are present within the cavernous internal carotid arteries. Skull: No acute fracture or focal lesion. Sinuses/Orbits: Right mastoidectomy. Left mastoid effusion. Paranasal sinuses are clear. The orbits are unremarkable. Other: None. CT CERVICAL SPINE FINDINGS Alignment: Grade 1 anterolisthesis of C3 on C4 and C4 on C5. Skull base and vertebrae: Multilevel severe degenerative changes of the spine. Associated severe right C2-C3, left C3-C4, right C4-C5 osseous neural foraminal stenosis. No acute fracture. No aggressive appearing focal osseous lesion or focal pathologic process. Soft tissues and spinal canal: No prevertebral fluid or swelling. No visible canal hematoma. Upper chest: Unremarkable. Other: None. IMPRESSION: 1. No acute intracranial abnormality. 2. No acute displaced fracture or traumatic listhesis of the cervical spine. 3. Multilevel severe  degenerative changes of the spine. Associated severe right C2-C3, left C3-C4, right C4-C5 osseous neural foraminal stenosis. 4. Empty sella. Findings is often a normal anatomic variant but can be associated with idiopathic intracranial hypertension (pseudotumor cerebri). Electronically Signed   By: Tish Frederickson M.D.   On: 04/14/2023 19:43   DG Ankle Complete Left Result Date: 04/14/2023 CLINICAL DATA:  Knee gave way patient fell. EXAM: LEFT ANKLE COMPLETE - 3+ VIEW COMPARISON:  None Available. FINDINGS: Mild swelling about the left ankle. No acute fracture or dislocation. Degenerative changes about the midfoot. IMPRESSION: Mild swelling about the left ankle without acute fracture. Electronically Signed   By: Minerva Fester M.D.   On: 04/14/2023 19:30   DG Knee Complete 4 Views Right Result Date: 04/14/2023 CLINICAL DATA:  Fall after knee scaphoid EXAM: LEFT KNEE - COMPLETE 4+ VIEW; RIGHT KNEE - COMPLETE 4+ VIEW COMPARISON:  None Available. FINDINGS: No acute fracture or dislocation. 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 There is moderate to advanced medial compartment narrowing on the left and moderate to advanced medial and lateral compartment  narrowing on the right. No knee joint effusion. Vascular calcifications. IMPRESSION: 1. No acute fracture or dislocation. 2. 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 Electronically Signed   By: Minerva Fester M.D.   On: 04/14/2023 19:28   DG Knee Complete 4 Views Left Result Date: 04/14/2023 CLINICAL DATA:  Fall after knee scaphoid EXAM: LEFT KNEE - COMPLETE 4+ VIEW; RIGHT KNEE - COMPLETE 4+ VIEW COMPARISON:  None Available. FINDINGS: No acute fracture or dislocation. 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 There is moderate to advanced medial compartment narrowing on the left and moderate to advanced medial and lateral compartment narrowing on the right. No knee joint effusion. Vascular calcifications.  IMPRESSION: 1. No acute fracture or dislocation. 2. 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 Electronically Signed   By: Minerva Fester M.D.   On: 04/14/2023 19:28   DG Ankle Complete Right Result Date: 04/14/2023 CLINICAL DATA:  Ankle pain following fall, initial encounter EXAM: RIGHT ANKLE - COMPLETE 3+ VIEW COMPARISON:  None Available. FINDINGS: No acute fracture or dislocation is noted. Flattening of the plantar arch is noted. Tarsal degenerative changes are seen. IMPRESSION: Chronic changes without acute abnormality. Electronically Signed   By: Alcide Clever M.D.   On: 04/14/2023 19:25   DG Hip Unilat With Pelvis 2-3 Views Left Result Date: 04/14/2023 CLINICAL DATA:  Hip pain after fall. EXAM: DG HIP (WITH OR WITHOUT PELVIS) 2-3V RIGHT; DG HIP (WITH OR WITHOUT PELVIS) 2-3V LEFT COMPARISON:  None Available. FINDINGS: Bilateral THA. No radiographic evidence of loosening. No acute fracture or dislocation. Heterotopic ossification about both proximal femurs. Pelvic phleboliths. IMPRESSION: Negative. Electronically Signed   By: Minerva Fester M.D.   On: 04/14/2023 19:25   DG Hip Unilat With Pelvis 2-3 Views Right Result Date: 04/14/2023 CLINICAL DATA:  Hip pain after fall. EXAM: DG HIP (WITH OR WITHOUT PELVIS) 2-3V RIGHT; DG HIP (WITH OR WITHOUT PELVIS) 2-3V LEFT COMPARISON:  None Available. FINDINGS: Bilateral THA. No radiographic evidence of loosening. No acute fracture or dislocation. Heterotopic ossification about both proximal femurs. Pelvic phleboliths. IMPRESSION: Negative. Electronically Signed   By: Minerva Fester M.D.   On: 04/14/2023 19:25    Procedures Procedures  {Document cardiac monitor, telemetry assessment procedure when appropriate:1}  Medications Ordered in ED Medications  ceFEPIme (MAXIPIME) 2 g in sodium chloride 0.9 % 100 mL IVPB (has no administration in time range)  metroNIDAZOLE (FLAGYL) IVPB 500 mg (has no administration in time range)  vancomycin  (VANCOCIN) IVPB 1000 mg/200 mL premix (has no administration in time range)  sodium chloride 0.9 % bolus 1,000 mL (has no administration in time range)    ED Course/ Medical Decision Making/ A&P   {   Click here for ABCD2, HEART and other calculatorsREFRESH Note before signing :1}                              Medical Decision Making Amount and/or Complexity of Data Reviewed Labs: ordered. Radiology: ordered.  Risk Prescription drug management.   ***  {Document critical care time when appropriate:1} {Document review of labs and clinical decision tools ie heart score, Chads2Vasc2 etc:1}  {Document your independent review of radiology images, and any outside records:1} {Document your discussion with family members, caretakers, and with consultants:1} {Document social determinants of health affecting pt's care:1} {Document your decision making why or why not admission, treatments were needed:1} Final Clinical Impression(s) / ED Diagnoses Final diagnoses:  None    Rx /  DC Orders ED Discharge Orders     None

## 2023-04-15 NOTE — H&P (Addendum)
History and Physical    Adam Ponce UXL:[id number] DOB: [birth year] DOA: 04/14/2023  PCP: System, Provider Not In  Patient coming from: Home  Chief Complaint: Fall  HPI: Adam Ponce is a 73 y.o. male with medical history significant of atrial flutter status post PPM on Coumadin, insulin-dependent type 2 diabetes, peripheral neuropathy, history of toe amputations, HIV, hypertension, hyperlipidemia, GERD, asthma/COPD, CKD stage IIIa, OSA, ILD, BPH presented to the ED after he had a fall due to generalized weakness.  He was found on the floor by maintenance staff at his facility and they called 911.  Vital signs on arrival: Temperature 98.8 F, pulse 87, respiratory rate 17, blood pressure 146/70, and SpO2 94% on room air.  Labs notable for WBC count 15.3, sodium 133, bicarb 21, anion gap 12, glucose 247, BUN 25, creatinine 2.3, normal LFTs, CRP 18.4, ESR pending, CK 2253, initial lactic acid 1.3 and repeat pending. UA with negative nitrite, small amount of leukocytes, and microscopy showing 6-10 RBCs, >50 WBCs, and many bacteria.  Urine culture pending.  Blood cultures collected.  Chest x-ray showing cardiomegaly and vascular congestion.  No confluent opacity, overt edema, or effusions.  No acute bony abnormality seen on chest x-ray. Exam concerning for infection at the site of left second toe amputation/foot cellulitis.  X-ray negative for osteomyelitis.  Remainder of scans including bilateral hip/pelvic x-rays, bilateral knee x-rays, bilateral ankle x-rays, CT head, and CT C-spine negative for fractures/acute traumatic injuries. Patient was given vancomycin, cefepime, metronidazole, and 1 L normal saline.  [facility] called to admit.  Patient is a poor historian.  States yesterday afternoon he was in the bathroom trying to get up from the toilet seat but felt so weak that he fell.  Denies hitting his head but he thinks he might have blacked out after he fell on the floor.  He thinks he might have been on the  floor for an hour or two before someone in his apartment complex found him and called 911.  He reports history of left second toe amputation surgery 2 years ago and for the past 3 weeks he has noticed drainage from this area and is concerned that his foot might be infected.  His foot is painful when he walks.  Denies fevers or chills.  He reports indigestion and 1 episode of vomiting yesterday.  No longer nauseous or vomiting.  Denies abdominal pain and is having regular bowel movements.  Denies diarrhea.  Denies cough, shortness breath, or chest pain.  He reports history of atrial flutter and has a pacemaker.  He receives his care at the [facility] and is not aware of having history of CAD or CHF.  Review of Systems:  Review of Systems  All other systems reviewed and are negative.   Past Medical History:  Diagnosis Date   Arthritis    Asthma    Constipation    Diabetes mellitus    Heart disease    HIV disease (HCC) 04/19/1993   Hypertension     Past Surgical History:  Procedure Laterality Date   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   JOINT REPLACEMENT  2010   Bilateral hip     reports that he has quit smoking. His smoking use included cigarettes. He has never used smokeless tobacco. He reports that he does not drink alcohol and does not use drugs.  Allergies  Allergen Reactions   Ciprofloxacin Other (See Comments)    unknown   Ciprofloxacin-Dexamethasone Other (See Comments)  unknown   Codeine     Family History  Problem Relation Age of Onset   Heart attack Father     Prior to Admission medications   Medication Sig Start Date End Date Taking? Authorizing Provider  albuterol (PROVENTIL HFA;VENTOLIN HFA) 108 (90 Base) MCG/ACT inhaler Inhale into the lungs every 6 (six) hours as needed for wheezing or shortness of breath.    [provider]  Azelastine HCl 137 MCG/SPRAY SOLN Place into the nose.    [provider]  bacitracin 500 UNIT/GM ointment Apply 1  Application topically 2 (two) times daily. Small amount    [provider]  benzonatate (TESSALON) 100 MG capsule Take 100 mg by mouth every 8 (eight) hours as needed for cough.    [provider]  betamethasone valerate (VALISONE) 0.1 % cream Apply topically 2 (two) times daily.    [provider]  cetirizine (ZYRTEC) 10 MG tablet Take 10 mg by mouth daily.    [provider]  Cholecalciferol (VITAMIN D3) 10000 units TABS Take by mouth.    [provider]  ciprofloxacin-dexamethasone (CIPRODEX) otic suspension 4 drops 2 (two) times daily.    [provider]  cyanocobalamin (,VITAMIN B-12,) 1000 MCG/ML injection Inject 1,000 mcg into the muscle once.    [provider]  darunavir (PREZISTA) 800 MG tablet Take 800 mg by mouth.    [provider]  docusate sodium (COLACE) 100 MG capsule Take 100 mg by mouth 2 (two) times daily.    [provider]  EMTRICITABINE-TENOFOVIR AF PO Take by mouth.    [provider]  ezetimibe (ZETIA) 10 MG tablet Take 10 mg by mouth daily.    [provider]  famotidine (PEPCID) 20 MG tablet Take 20 mg by mouth 2 (two) times daily.    [provider]  fenofibrate (TRICOR) 145 MG tablet Take 145 mg by mouth daily.    [provider]  glipiZIDE (GLUCOTROL) 10 MG tablet Take 10 mg by mouth daily before breakfast.    [provider]  guaiFENesin (MUCINEX) 600 MG 12 hr tablet Take 600 mg by mouth every 12 (twelve) hours as needed for cough (congestion).    [provider]  hydrocerin (EUCERIN) CREA Apply 1 Application topically as needed.    [provider]  INSULIN GLARGINE [place] Inject into the skin.    [provider]  ipratropium (ATROVENT) 0.06 % nasal spray Place 2 sprays into both nostrils 3 (three) times daily as needed for rhinitis.    [provider]  lisinopril (PRINIVIL,ZESTRIL) 40 MG tablet Take 40 mg by  mouth daily.    [provider]  metFORMIN (GLUCOPHAGE) 1000 MG tablet Take 1,000 mg by mouth 2 (two) times daily with a meal.    [provider]  pravastatin (PRAVACHOL) 80 MG tablet Take 80 mg by mouth daily.    [provider]  pregabalin (LYRICA) 100 MG capsule Take 100 mg by mouth 2 (two) times daily.    [provider]  ritonavir (NORVIR) 100 MG TABS tablet Take by mouth.    [provider]  SEMAGLUTIDE, 1 MG/DOSE, [place] Inject 1 mg into the skin once a week.    [provider]  sennosides-docusate sodium (SENOKOT-S) 8.6-50 MG tablet Take 1 tablet by mouth daily.    [provider]  sodium chloride (OCEAN) 0.65 % nasal spray Place 1 spray into the nose 4 (four) times daily as needed for congestion.  [provider]  traMADol (ULTRAM) 50 MG tablet Take by mouth every 6 (six) hours as needed.    [provider]  triamcinolone cream (KENALOG) 0.1 % Apply 1 Application topically daily as needed (itching on ears).    [provider]  VALACYCLOVIR HCL PO Take 1 g by mouth in the morning and at bedtime. As needed for 5 days (for outbreaks)    [provider]  warfarin (COUMADIN) 5 MG tablet Take 7.5 mg by mouth daily.    [provider]    Physical Exam: Vitals:   04/14/23 1722 04/15/23 0040 04/15/23 0200  BP: (!) 146/70 (!) 125/55 111/75  Pulse: 87 65 83  Resp: 17 20 (!) 26  Temp: 98.8 F (37.1 C) 98.5 F (36.9 C) 98 F (36.7 C)  TempSrc: Oral Oral Oral  SpO2: 94% 96% 97%    Physical Exam Vitals reviewed.  Constitutional:      General: He is not in acute distress. HENT:     Head: Normocephalic and atraumatic.  Eyes:     Extraocular Movements: Extraocular movements intact.  Cardiovascular:     Rate and Rhythm: Normal rate and regular rhythm.     Pulses: Normal pulses.  Pulmonary:     Effort: Pulmonary effort is normal. No respiratory distress.     Breath sounds: No  wheezing, rhonchi or rales.  Abdominal:     General: Bowel sounds are normal. There is no distension.     Palpations: Abdomen is soft.     Tenderness: There is no abdominal tenderness. There is no guarding.  Musculoskeletal:     Cervical back: Normal range of motion.     Right lower leg: No edema.     Left lower leg: No edema.     Comments: Left foot erythematous, swollen, and warm to touch.  See images.  Dorsalis pedis pulse intact.  Skin:    General: Skin is warm and dry.  Neurological:     General: No focal deficit present.     Mental Status: He is alert and oriented to person, place, and time.         Labs on Admission: I have personally reviewed following labs and imaging studies  CBC: Recent Labs  Lab 04/14/23 2116  WBC 15.3*  NEUTROABS 11.9*  HGB 14.3  HCT 42.7  MCV 93.8  PLT 238   Basic Metabolic Panel: Recent Labs  Lab 04/14/23 2116  NA 133*  K 4.8  CL 100  CO2 21*  GLUCOSE 247*  BUN 25*  CREATININE 2.34*  CALCIUM 9.8   GFR: CrCl cannot be calculated (Unknown ideal weight.). Liver Function Tests: Recent Labs  Lab 04/14/23 2116  AST 39  ALT 22  ALKPHOS 55  BILITOT 0.8  PROT 8.2*  ALBUMIN 3.6   No results for input(s): "LIPASE", "AMYLASE" in the last 168 hours. No results for input(s): "AMMONIA" in the last 168 hours. Coagulation Profile: Recent Labs  Lab 04/15/23 0107  INR 1.2   Cardiac Enzymes: Recent Labs  Lab 04/15/23 0107  CKTOTAL 2,253*   BNP (last 3 results) No results for input(s): "PROBNP" in the last 8760 hours. HbA1C: No results for input(s): "HGBA1C" in the last 72 hours. CBG: No results for input(s): "GLUCAP" in the last 168 hours. Lipid Profile: No results for input(s): "CHOL", "HDL", "LDLCALC", "TRIG", "CHOLHDL", "LDLDIRECT" in the last 72 hours. Thyroid Function Tests: No results for input(s): "TSH", "T4TOTAL", "FREET4", "T3FREE", "THYROIDAB" in the last 72 hours. Anemia  Panel: No results for input(s):  "VITAMINB12", "FOLATE", "FERRITIN", "TIBC", "IRON", "RETICCTPCT" in the last 72 hours. Urine analysis:    Component Value Date/Time   COLORURINE YELLOW 04/15/2023 0014   APPEARANCEUR HAZY (A) 04/15/2023 0014   LABSPEC 1.015 04/15/2023 0014   PHURINE 5.0 04/15/2023 0014   GLUCOSEU 50 (A) 04/15/2023 0014   HGBUR LARGE (A) 04/15/2023 0014   BILIRUBINUR NEGATIVE 04/15/2023 0014   KETONESUR NEGATIVE 04/15/2023 0014   PROTEINUR 100 (A) 04/15/2023 0014   UROBILINOGEN 1.0 08/15/2008 0955   NITRITE NEGATIVE 04/15/2023 0014   LEUKOCYTESUR SMALL (A) 04/15/2023 0014    Radiological Exams on Admission: DG Toe 2nd Left Result Date: 04/15/2023 CLINICAL DATA:  Evaluate for osteomyelitis.  Fall. EXAM: LEFT SECOND TOE COMPARISON:  None Available. 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 No acute bony abnormality. Specifically, no fracture, subluxation, or dislocation. No bone destruction to suggest osteomyelitis. Degenerative changes in the midfoot. IMPRESSION: No acute bony abnormality. Electronically Signed   By: Charlett Nose M.D.   On: 04/15/2023 00:51   DG Chest Port 1 View Result Date: 04/15/2023 CLINICAL DATA:  Questionable sepsis - evaluate for abnormality. Fall. EXAM: PORTABLE CHEST 1 VIEW COMPARISON:  05/13/2009 FINDINGS: Left pacer in place with leads in the right atrium and right ventricle. Cardiomegaly, vascular congestion. No confluent opacity, overt edema or effusions. No pneumothorax. No acute bony abnormality. IMPRESSION: Cardiomegaly, vascular congestion. Electronically Signed   By: Charlett Nose M.D.   On: 04/15/2023 00:48   CT Head Wo Contrast Result Date: 04/14/2023 CLINICAL DATA:  Head trauma, minor (Age >= 65y); Neck trauma (Age >= 65y) EXAM: CT HEAD WITHOUT CONTRAST CT CERVICAL SPINE WITHOUT CONTRAST TECHNIQUE: Multidetector CT imaging of the head and cervical spine was performed following the standard protocol without intravenous contrast.  Multiplanar CT image reconstructions of the cervical spine were also generated. RADIATION DOSE REDUCTION: This exam was performed according to the departmental dose-optimization program which includes automated exposure control, adjustment of the mA and/or kV according to patient size and/or use of iterative reconstruction technique. COMPARISON:  None Available. FINDINGS: CT HEAD FINDINGS Brain: Patchy and confluent areas of decreased attenuation are noted throughout the deep and periventricular white matter of the cerebral hemispheres bilaterally, compatible with chronic microvascular ischemic disease. No evidence of large-territorial acute infarction. No parenchymal hemorrhage. No mass lesion. No extra-axial collection. No mass effect or midline shift. No hydrocephalus. Basilar cisterns are patent. Empty sella. Vascular: No hyperdense vessel. Atherosclerotic calcifications are present within the cavernous internal carotid arteries. Skull: No acute fracture or focal lesion. Sinuses/Orbits: Right mastoidectomy. Left mastoid effusion. Paranasal sinuses are clear. The orbits are unremarkable. Other: None. CT CERVICAL SPINE FINDINGS Alignment: Grade 1 anterolisthesis of C3 on C4 and C4 on C5. Skull base and vertebrae: Multilevel severe degenerative changes of the spine. Associated severe right C2-C3, left C3-C4, right C4-C5 osseous neural foraminal stenosis. No acute fracture. No aggressive appearing focal osseous lesion or focal pathologic process. Soft tissues and spinal canal: No prevertebral fluid or swelling. No visible canal hematoma. Upper chest: Unremarkable. Other: None. IMPRESSION: 1. No acute intracranial abnormality. 2. No acute displaced fracture or traumatic listhesis of the cervical spine. 3. Multilevel severe degenerative changes of the spine. Associated severe right C2-C3, left C3-C4, right C4-C5 osseous neural foraminal stenosis. 4. Empty sella. Findings is often a normal anatomic variant but can be  associated with idiopathic intracranial hypertension (pseudotumor cerebri). Electronically Signed   By: Normajean Glasgow.D.  On: 04/14/2023 19:43   CT Cervical Spine Wo Contrast Result Date: 04/14/2023 CLINICAL DATA:  Head trauma, minor (Age >= 65y); Neck trauma (Age >= 65y) EXAM: CT HEAD WITHOUT CONTRAST CT CERVICAL SPINE WITHOUT CONTRAST TECHNIQUE: Multidetector CT imaging of the head and cervical spine was performed following the standard protocol without intravenous contrast. Multiplanar CT image reconstructions of the cervical spine were also generated. RADIATION DOSE REDUCTION: This exam was performed according to the departmental dose-optimization program which includes automated exposure control, adjustment of the mA and/or kV according to patient size and/or use of iterative reconstruction technique. COMPARISON:  None Available. FINDINGS: CT HEAD FINDINGS Brain: Patchy and confluent areas of decreased attenuation are noted throughout the deep and periventricular white matter of the cerebral hemispheres bilaterally, compatible with chronic microvascular ischemic disease. No evidence of large-territorial acute infarction. No parenchymal hemorrhage. No mass lesion. No extra-axial collection. No mass effect or midline shift. No hydrocephalus. Basilar cisterns are patent. Empty sella. Vascular: No hyperdense vessel. Atherosclerotic calcifications are present within the cavernous internal carotid arteries. Skull: No acute fracture or focal lesion. Sinuses/Orbits: Right mastoidectomy. Left mastoid effusion. Paranasal sinuses are clear. The orbits are unremarkable. Other: None. CT CERVICAL SPINE FINDINGS Alignment: Grade 1 anterolisthesis of C3 on C4 and C4 on C5. Skull base and vertebrae: Multilevel severe degenerative changes of the spine. Associated severe right C2-C3, left C3-C4, right C4-C5 osseous neural foraminal stenosis. No acute fracture. No aggressive appearing focal osseous lesion or focal  pathologic process. Soft tissues and spinal canal: No prevertebral fluid or swelling. No visible canal hematoma. Upper chest: Unremarkable. Other: None. IMPRESSION: 1. No acute intracranial abnormality. 2. No acute displaced fracture or traumatic listhesis of the cervical spine. 3. Multilevel severe degenerative changes of the spine. Associated severe right C2-C3, left C3-C4, right C4-C5 osseous neural foraminal stenosis. 4. Empty sella. Findings is often a normal anatomic variant but can be associated with idiopathic intracranial hypertension (pseudotumor cerebri). Electronically Signed   By: Tish Frederickson M.D.   On: 04/14/2023 19:43   DG Ankle Complete Left Result Date: 04/14/2023 CLINICAL DATA:  Knee gave way patient fell. EXAM: LEFT ANKLE COMPLETE - 3+ VIEW COMPARISON:  None Available. FINDINGS: Mild swelling about the left ankle. No acute fracture or dislocation. Degenerative changes about the midfoot. IMPRESSION: Mild swelling about the left ankle without acute fracture. Electronically Signed   By: Minerva Fester M.D.   On: 04/14/2023 19:30   DG Knee Complete 4 Views Right Result Date: 04/14/2023 CLINICAL DATA:  Fall after knee scaphoid EXAM: LEFT KNEE - COMPLETE 4+ VIEW; RIGHT KNEE - COMPLETE 4+ VIEW COMPARISON:  None Available. FINDINGS: No acute fracture or dislocation. 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 There is moderate to advanced medial compartment narrowing on the left and moderate to advanced medial and lateral compartment narrowing on the right. No knee joint effusion. Vascular calcifications. IMPRESSION: 1. No acute fracture or dislocation. 2. 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 Electronically Signed   By: Minerva Fester M.D.   On: 04/14/2023 19:28   DG Knee Complete 4 Views Left Result Date: 04/14/2023 CLINICAL DATA:  Fall after knee scaphoid EXAM: LEFT KNEE - COMPLETE 4+ VIEW; RIGHT KNEE - COMPLETE 4+ VIEW COMPARISON:  None Available.  FINDINGS: No acute fracture or dislocation. 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 There is moderate to advanced medial compartment narrowing on the left and moderate to advanced medial and lateral compartment narrowing on the right. No knee joint effusion. Vascular calcifications. IMPRESSION: 1. No  acute fracture or dislocation. 2. 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 Electronically Signed   By: Minerva Fester M.D.   On: 04/14/2023 19:28   DG Ankle Complete Right Result Date: 04/14/2023 CLINICAL DATA:  Ankle pain following fall, initial encounter EXAM: RIGHT ANKLE - COMPLETE 3+ VIEW COMPARISON:  None Available. FINDINGS: No acute fracture or dislocation is noted. Flattening of the plantar arch is noted. Tarsal degenerative changes are seen. IMPRESSION: Chronic changes without acute abnormality. Electronically Signed   By: Alcide Clever M.D.   On: 04/14/2023 19:25   DG Hip Unilat With Pelvis 2-3 Views Left Result Date: 04/14/2023 CLINICAL DATA:  Hip pain after fall. EXAM: DG HIP (WITH OR WITHOUT PELVIS) 2-3V RIGHT; DG HIP (WITH OR WITHOUT PELVIS) 2-3V LEFT COMPARISON:  None Available. FINDINGS: Bilateral THA. No radiographic evidence of loosening. No acute fracture or dislocation. Heterotopic ossification about both proximal femurs. Pelvic phleboliths. IMPRESSION: Negative. Electronically Signed   By: Minerva Fester M.D.   On: 04/14/2023 19:25   DG Hip Unilat With Pelvis 2-3 Views Right Result Date: 04/14/2023 CLINICAL DATA:  Hip pain after fall. EXAM: DG HIP (WITH OR WITHOUT PELVIS) 2-3V RIGHT; DG HIP (WITH OR WITHOUT PELVIS) 2-3V LEFT COMPARISON:  None Available. FINDINGS: Bilateral THA. No radiographic evidence of loosening. No acute fracture or dislocation. Heterotopic ossification about both proximal femurs. Pelvic phleboliths. IMPRESSION: Negative. Electronically Signed   By: Minerva Fester M.D.   On: 04/14/2023 19:25    Assessment and Plan  Diabetic left  foot infection WBC count 15.3, no fever or tachycardia.  Lactate normal x 2.  No signs of sepsis at this time.  X-ray negative for osteomyelitis.  CRP 18.4, ESR 91.  MRI of left foot ordered for further evaluation but will not be done until daytime until pacemaker compatibility is verified.  Continue vancomycin, cefepime, and metronidazole at this time.  Follow-up blood cultures.  Trend WBC count.  Rhabdomyolysis AKI on CKD stage IIIa Patient fell and was found on the floor at his facility.  CK elevated at 2253.  BUN 25, creatinine 2.3 (no recent labs for comparison).  Creatinine was previously ranging between 1.3-1.5 on labs 4 years ago.  Continue gentle IV fluid hydration and monitor volume status closely.  Trend CK.  Monitor renal function and avoid nephrotoxic agents/hold home lisinopril.  Suspected CHF, type unknown No previous echo results in the chart.  He receives his care at the [facility].  Chest x-ray showing cardiomegaly and vascular congestion but no overt edema.  No hypoxia or respiratory distress.  Receiving IV fluids due to rhabdomyolysis and AKI.  BNP and echocardiogram ordered.  Possible UTI UA (not clean-catch) with evidence of pyuria and bacteriuria.  Patient is not endorsing any urinary symptoms.  Continue antibiotics and follow-up urine culture.  Mild hyponatremia Gentle IV fluid hydration and monitor labs.  Syncope Likely vasovagal event.  CT head negative for acute intracranial abnormality and no focal neurodeficit on exam.  PE less likely given no tachycardia, chest pain, or hypoxia.  Continue cardiac monitoring and echocardiogram ordered.  Hold antihypertensives and check orthostatics.  Stat EKG ordered.  Generalized weakness PT/OT eval, fall precautions.  Atrial flutter Has a pacemaker.  EKG ordered.  On Coumadin and INR subtherapeutic at 1.2.  Holding Coumadin until foot MRI is done to rule out osteomyelitis/need for possible surgery.  Started on IV heparin for  now.  Addendum 04/15/2023 at 5:06 AM: Notified by pharmacy that patient is currently not on anticoagulation for his  atrial flutter.  He has not been on Coumadin for over a year.  Will hold starting IV heparin at this time until records can be obtained from the [facility] in the morning to determine why anticoagulation was stopped.  Insulin-dependent type 2 diabetes Glucose in the 240s without signs of DKA.  No previous A1c results in the chart.  Check A1c, sensitive sliding scale insulin.  Hypertension Currently normotensive.  Hold antihypertensives and check orthostatics given possible syncopal event.  Hyperlipidemia Hold statin at this time in the setting of rhabdomyolysis.  Peripheral neuropathy HIV Hyperlipidemia GERD Asthma/COPD: Stable, no signs of acute exacerbation. ILD BPH Pharmacy med rec pending.  DVT prophylaxis: IV heparin gtt Code Status: DNR (discussed with the patient) Family Communication: No family available at this time. Level of care: Telemetry bed Admission status: It is my clinical opinion that admission to INPATIENT is reasonable and necessary because of the expectation that this patient will require hospital care that crosses at least 2 midnights to treat this condition based on the medical complexity of the problems presented.  Given the aforementioned information, the predictability of an adverse outcome is felt to be significant.  John Giovanni MD [facility]  If 7PM-7AM, please contact night-coverage [email or web address]  04/15/2023, 2:43 AM

## 2023-04-15 NOTE — ED Notes (Signed)
Patient transported to x-ray. ?

## 2023-04-16 ENCOUNTER — Inpatient Hospital Stay (HOSPITAL_COMMUNITY): Payer: No Typology Code available for payment source

## 2023-04-16 ENCOUNTER — Encounter (HOSPITAL_COMMUNITY): Payer: No Typology Code available for payment source

## 2023-04-16 DIAGNOSIS — R6520 Severe sepsis without septic shock: Principal | ICD-10-CM

## 2023-04-16 DIAGNOSIS — E11628 Type 2 diabetes mellitus with other skin complications: Secondary | ICD-10-CM | POA: Diagnosis not present

## 2023-04-16 DIAGNOSIS — B9561 Methicillin susceptible Staphylococcus aureus infection as the cause of diseases classified elsewhere: Secondary | ICD-10-CM

## 2023-04-16 DIAGNOSIS — M869 Osteomyelitis, unspecified: Secondary | ICD-10-CM

## 2023-04-16 DIAGNOSIS — A419 Sepsis, unspecified organism: Secondary | ICD-10-CM

## 2023-04-16 DIAGNOSIS — T8744 Infection of amputation stump, left lower extremity: Secondary | ICD-10-CM

## 2023-04-16 DIAGNOSIS — L98499 Non-pressure chronic ulcer of skin of other sites with unspecified severity: Secondary | ICD-10-CM

## 2023-04-16 DIAGNOSIS — L089 Local infection of the skin and subcutaneous tissue, unspecified: Secondary | ICD-10-CM | POA: Diagnosis not present

## 2023-04-16 DIAGNOSIS — R7881 Bacteremia: Secondary | ICD-10-CM | POA: Diagnosis not present

## 2023-04-16 DIAGNOSIS — Z89422 Acquired absence of other left toe(s): Secondary | ICD-10-CM

## 2023-04-16 DIAGNOSIS — Z794 Long term (current) use of insulin: Secondary | ICD-10-CM

## 2023-04-16 LAB — GLUCOSE, CAPILLARY
Glucose-Capillary: 205 mg/dL — ABNORMAL HIGH (ref 70–99)
Glucose-Capillary: 207 mg/dL — ABNORMAL HIGH (ref 70–99)
Glucose-Capillary: 225 mg/dL — ABNORMAL HIGH (ref 70–99)
Glucose-Capillary: 229 mg/dL — ABNORMAL HIGH (ref 70–99)
Glucose-Capillary: 255 mg/dL — ABNORMAL HIGH (ref 70–99)
Glucose-Capillary: 256 mg/dL — ABNORMAL HIGH (ref 70–99)
Glucose-Capillary: 266 mg/dL — ABNORMAL HIGH (ref 70–99)

## 2023-04-16 LAB — CBC
HCT: 39.6 % (ref 39.0–52.0)
Hemoglobin: 13.6 g/dL (ref 13.0–17.0)
MCH: 32.2 pg (ref 26.0–34.0)
MCHC: 34.3 g/dL (ref 30.0–36.0)
MCV: 93.8 fL (ref 80.0–100.0)
Platelets: 200 10*3/uL (ref 150–400)
RBC: 4.22 MIL/uL (ref 4.22–5.81)
RDW: 13.9 % (ref 11.5–15.5)
WBC: 12.2 10*3/uL — ABNORMAL HIGH (ref 4.0–10.5)
nRBC: 0 % (ref 0.0–0.2)

## 2023-04-16 LAB — BASIC METABOLIC PANEL
Anion gap: 8 (ref 5–15)
BUN: 19 mg/dL (ref 8–23)
CO2: 23 mmol/L (ref 22–32)
Calcium: 8.9 mg/dL (ref 8.9–10.3)
Chloride: 100 mmol/L (ref 98–111)
Creatinine, Ser: 1.86 mg/dL — ABNORMAL HIGH (ref 0.61–1.24)
GFR, Estimated: 38 mL/min — ABNORMAL LOW (ref >60)
Glucose, Bld: 232 mg/dL — ABNORMAL HIGH (ref 70–99)
Potassium: 4.5 mmol/L (ref 3.5–5.1)
Sodium: 131 mmol/L — ABNORMAL LOW (ref 135–145)

## 2023-04-16 MED ORDER — ACETAMINOPHEN 500 MG PO TABS: 1000.0000 mg | ORAL_TABLET | Freq: Four times a day (QID) | ORAL | Status: DC

## 2023-04-16 MED ORDER — ACETAMINOPHEN 500 MG PO TABS
1000.0000 mg | ORAL_TABLET | Freq: Four times a day (QID) | ORAL | Status: DC
  Administered 2023-04-16 – 2023-04-23 (×23): 1000 mg via ORAL
  Filled 2023-04-16 (×27): qty 2

## 2023-04-16 MED ORDER — INSULIN GLARGINE-YFGN 100 UNIT/ML ~~LOC~~ SOLN
10.0000 [IU] | Freq: Every day | SUBCUTANEOUS | Status: DC
Start: 2023-04-16 — End: 2023-04-19
  Administered 2023-04-16 – 2023-04-18 (×3): 10 [IU] via SUBCUTANEOUS
  Filled 2023-04-16 (×4): qty 0.1

## 2023-04-16 MED ORDER — VANCOMYCIN HCL IN DEXTROSE 1-5 GM/200ML-% IV SOLN: 1000.0000 mg | INTRAVENOUS | Status: DC

## 2023-04-16 MED ORDER — VANCOMYCIN HCL IN DEXTROSE 1-5 GM/200ML-% IV SOLN: 1000.0000 mg | Freq: Two times a day (BID) | INTRAVENOUS | Status: DC

## 2023-04-16 MED ORDER — SODIUM CHLORIDE 0.9 % IV SOLN
3.0000 g | Freq: Four times a day (QID) | INTRAVENOUS | Status: DC
  Administered 2023-04-16 – 2023-04-18 (×7): 3 g via INTRAVENOUS
  Filled 2023-04-16 (×8): qty 8

## 2023-04-16 NOTE — Progress Notes (Addendum)
Pharmacy Antibiotic Note  Adam Ponce is a 72 y.o. male admitted on 04/14/2023 with AMS/fall, LLE cellulitis.  Pharmacy has been consulted for Vancomycin and Cefepime  dosing. MSSA found in blood culture from 12/27. Continuing broad spectrum antibiotics given osteomyelitis and abscess on MRI.   12/28: Renal function improved, Scr 1.86 today, 12/28. Febrile (Tmax 103.74F) today, WBC decreased to 12.2.  Plan: Vancomycin 1000 mg IV every 24 hours (eAUC 439, Scr 1.86, IBW, Vd 0.5) Cefepime 2 g IV every 12 hours Metronidazole 500 mg IV every 12 hours Monitor renal and f/u levels as appropriate Monitor culture results  ADDENDUM 12/28 PM: Per ID MD request, will be switching antibiotics to Unasyn for MSSA bacteremia and diabetic foot infection. Pharmacy consulted to assist with Unasyn dosing. Plan to start Ampicillin/sulbactam 3 gm IV every 6 hours. Continue to monitor culture results and renal function. F/u LOT   Height: 5\' 11"  (180.3 cm) Weight: 126 kg (277 lb 12.5 oz) IBW/kg (Calculated) : 75.3  Temp (24hrs), Avg:99.4 F (37.4 C), Min:97.8 F (36.6 C), Max:103.2 F (39.6 C)  Recent Labs  Lab 04/14/23 2116 04/15/23 0107 04/15/23 0115 04/15/23 0302 04/16/23 0852  WBC 15.3* 15.4*  --   --  12.2*  CREATININE 2.34* 2.16*  --   --  1.86*  LATICACIDVEN  --   --  1.3 1.3  --     Estimated Creatinine Clearance: 48.5 mL/min (A) (by C-G formula based on SCr of 1.86 mg/dL (H)).    Allergies  Allergen Reactions   Ciprofloxacin Other (See Comments)    unknown   Ciprofloxacin-Dexamethasone Other (See Comments)    unknown   Codeine      Vancomycin 12/27 >> 12/28 Cefepime 12/27 >> 12/28 Metronidazole 12/27 >> 12/28 Unasyn 12/28 >>  12/27 Bcx: Staphylococcus aureus 12/27 Urince Cx:   Enos Fling, PharmD PGY-1 Acute Care Pharmacy Resident 04/16/2023 12:27 PM

## 2023-04-16 NOTE — Progress Notes (Signed)
PODIATRY CONSULTATION  NAME Adam Ponce MRN [id number] DOB 1950/11/17 DOA 04/14/2023   Reason for consult:  Chief Complaint  Patient presents with   Fall    Attending/Consulting physician: Verner Mould DO  History of present illness: "Adam Ponce is a 72 y.o. male with medical history significant of atrial flutter status post PPM on Coumadin, insulin-dependent type 2 diabetes, peripheral neuropathy, history of toe amputations, HIV, hypertension, hyperlipidemia, GERD, asthma/COPD, CKD stage IIIa, OSA, ILD, BPH presented to the ED after he had a fall due to generalized weakness.  He was found on the floor by maintenance staff at his facility and they called 911.  Vital signs on arrival: Temperature 98.8 F, pulse 87, respiratory rate 17, blood pressure 146/70, and SpO2 94% on room air.  Labs notable for WBC count 15.3, sodium 133, bicarb 21, anion gap 12, glucose 247, BUN 25, creatinine 2.3, normal LFTs, CRP 18.4, ESR pending, CK 2253, initial lactic acid 1.3 and repeat pending. UA with negative nitrite, small amount of leukocytes, and microscopy showing 6-10 RBCs, >50 WBCs, and many bacteria.  Urine culture pending.  Blood cultures collected.  Chest x-ray showing cardiomegaly and vascular congestion.  No confluent opacity, overt edema, or effusions.  No acute bony abnormality seen on chest x-ray. Exam concerning for infection at the site of left second toe amputation/foot cellulitis.  X-ray negative for osteomyelitis.  Remainder of scans including bilateral hip/pelvic x-rays, bilateral knee x-rays, bilateral ankle x-rays, CT head, and CT C-spine negative for fractures/acute traumatic injuries. Patient was given vancomycin, cefepime, metronidazole, and 1 L normal saline.  [facility] called to admit.   Patient is a poor historian.  States yesterday afternoon he was in the bathroom trying to get up from the toilet seat but felt so weak that he fell.  Denies hitting his head but he thinks he might have blacked  out after he fell on the floor.  He thinks he might have been on the floor for an hour or two before someone in his apartment complex found him and called 911.  He reports history of left second toe amputation surgery 2 years ago and for the past 3 weeks he has noticed drainage from this area and is concerned that his foot might be infected.  His foot is painful when he walks.  Denies fevers or chills.  He reports indigestion and 1 episode of vomiting yesterday.  No longer nauseous or vomiting.  Denies abdominal pain and is having regular bowel movements.  Denies diarrhea.  Denies cough, shortness breath, or chest pain.  He reports history of atrial flutter and has a pacemaker.  He receives his care at the [facility] and is not aware of having history of CAD or CHF."   Pt not able to answer questions at bedside due to shaking chills.  He states he has pain in his left foot.   Past Medical History:  Diagnosis Date   Arthritis    Asthma    Constipation    Diabetes mellitus    Heart disease    HIV disease (HCC) 04/19/1993   Hypertension        Latest Ref Rng & Units 04/16/2023    8:52 AM 04/15/2023    1:07 AM 04/14/2023    9:16 PM  CBC  WBC 4.0 - 10.5 K/uL 12.2  15.4  15.3   Hemoglobin 13.0 - 17.0 g/dL 40.9  81.1  91.4   Hematocrit 39.0 - 52.0 % 39.6  40.0  42.7   Platelets 150 - 400 K/uL 200  224  238        Latest Ref Rng & Units 04/16/2023    8:52 AM 04/15/2023    1:07 AM 04/14/2023    9:16 PM  BMP  Glucose 70 - 99 mg/dL 865  784  696   BUN 8 - 23 mg/dL 19  27  25    Creatinine 0.61 - 1.24 mg/dL 2.95  2.84  1.32   Sodium 135 - 145 mmol/L 131  131  133   Potassium 3.5 - 5.1 mmol/L 4.5  4.4  4.8   Chloride 98 - 111 mmol/L 100  98  100   CO2 22 - 32 mmol/L 23  19  21    Calcium 8.9 - 10.3 mg/dL 8.9  9.3  9.8       Physical Exam: Lower Extremity Exam Vasc: R - PT palpable, DP palpable. Cap refill diminished  L - PT palpable, DP palpable. Cap refill dimiinshed  Derm: R - Normal  temp/texture/turgor with no open lesion or clinical signs of infection   L - Erythema and edema of the left 2nd toe, forefoot      MSK:  R -  No gross deformities. Compartments soft, non-tender, compressible  L - Edema of the left foot  Neuro: R - Gross sensation diminished. Gross motor function intact   L - Gross sensation diminished. Gross motor function intact  MRI L foot WO contrast: Osteomyelitis of proximal phalanx 2nd toe. 9 mm abscess dorsal 2nd toe toe.   ASSESSMENT/PLAN OF CARE 72 y.o. 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   Febrile to 103, Tachycardia WBC 12.2 (15.4) A1c 8.6 CRP 19.4 ESR 91  - NPO p MN for possible left 2nd toe amputation tomorrow vs Monday AM. Currently scheduled for Monday 0730. If he worsens clinically will plan to do tomorrow.  - Continue IV abx broad spectrum pending further culture data - Anticoagulation: per primary no need to hold - Wound care: none needed pre op - WB status: WBAT bilateral lower extremity - Will continue to follow   Thank you for the consult.  Please contact me directly with any questions or concerns.           Corinna Gab, DPM [facility] / [facility]    [address] [place], [place] [address]                Office [phone]  Fax [phone]

## 2023-04-16 NOTE — Progress Notes (Signed)
PROGRESS NOTE    Adam Ponce  UJW:[id number] DOB: Aug 14, 1950 DOA: 04/14/2023 PCP: System, Provider Not In    Brief Narrative:  Adam Ponce is a 72 y.o. male with medical history significant of atrial flutter status post PPM on Coumadin, insulin-dependent type 2 diabetes, peripheral neuropathy, history of toe amputations, HIV, hypertension, hyperlipidemia, GERD, asthma/COPD, CKD stage IIIa, OSA, ILD, BPH presented to the ED after he had a fall due to generalized weakness.  He was found on the floor by maintenance staff at his facility and they called 911.  Patient is a poor historian.  States yesterday afternoon he was in the bathroom trying to get up from the toilet seat but felt so weak that he fell.  Denies hitting his head but he thinks he might have blacked out after he fell on the floor.  He thinks he might have been on the floor for an hour or two before someone in his apartment complex found him and called 911.  He reports history of left second toe amputation surgery 2 years ago and for the past 3 weeks he has noticed drainage from this area and is concerned that his foot might be infected.  His foot is painful when he walks.   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 2. 9 mm in diameter abscess along the dorsal-lateral margin of the remaining proximal phalanx second toe with surrounding cellulitis. -podiatry consulted   Rhabdomyolysis AKI on CKD stage IIIa -trend CKs -Cr trending down   Suspected CHF, type unknown No previous echo results in the chart.  He receives his care at the [facility].   -check echo: Left ventricular ejection fraction, by estimation, is 50 to 55%. The  left ventricle has low normal function. The left ventricle has no regional  wall motion abnormalities. Left  ventricular diastolic parameters were  normal.    Possible UTI UA (not clean-catch) with evidence of pyuria and bacteriuria.  Patient is not endorsing any urinary symptoms.  Continue antibiotics and follow-up urine culture.   Mild hyponatremia Gentle IV fluid hydration and monitor labs.   Syncope Likely vasovagal event.  CT head negative for acute intracranial abnormality and no focal neurodeficit on exam.  PE less likely given no tachycardia, chest pain, or hypoxia.    Generalized weakness PT/OT eval, fall precautions.   Atrial flutter Has a pacemaker.   -per patient he was taken off coumadin by his PCP at the [facility]   Insulin-dependent type 2 diabetes -SSI and long acting   Hypertension Currently normotensive -resume meds as able   Hyperlipidemia Hold statin at this time in the setting of rhabdomyolysis.   Peripheral neuropathy HIV- resume home meds Hyperlipidemia GERD Asthma/COPD: Stable, no signs of acute exacerbation. ILD BPH   DVT prophylaxis:     Code Status: Do not attempt resuscitation (DNR) PRE-ARREST INTERVENTIONS DESIRED   Disposition Plan:  Level of care: Progressive Status is: Inpatient Remains inpatient appropriate     Consultants:  ID podiatry   Subjective: Unsure of why PCP took him off coumadin-- thinks it is because he got a pacemaker   Objective: Vitals:   04/16/23 0804 04/16/23 1114 04/16/23 1227 04/16/23 1228  BP: (!) 116/58 (!) 170/100 (!) 125/57 (!) 125/57  Pulse: 70 (!) 190 90 79  Resp: 20 (!) 38 (!)  28   Temp: 98.5 F (36.9 C) (!) 103.2 F (39.6 C) 99 F (37.2 C) 99 F (37.2 C)  TempSrc: Oral   Oral  SpO2: 97% 97% 98% 96%  Weight:      Height:        Intake/Output Summary (Last 24 hours) at 04/16/2023 1234 Last data filed at 04/16/2023 1100 Gross per 24 hour  Intake 1341.77 ml  Output 1850 ml  Net -508.23 ml   Filed Weights   04/15/23 0354  Weight: 126 kg    Examination:   General: Appearance:    Obese  male ill appearing     Lungs:    respirations unlabored  Heart:    Normal heart rate. Normal rhythm. No murmurs, rubs, or gallops.    MS:   All extremities are intact.    Neurologic:   Awake, alert       Data Reviewed: I have personally reviewed following labs and imaging studies  CBC: Recent Labs  Lab 04/14/23 2116 04/15/23 0107 04/16/23 0852  WBC 15.3* 15.4* 12.2*  NEUTROABS 11.9*  --   --   HGB 14.3 13.7 13.6  HCT 42.7 40.0 39.6  MCV 93.8 93.5 93.8  PLT 238 224 200   Basic Metabolic Panel: Recent Labs  Lab 04/14/23 2116 04/15/23 0107 04/16/23 0852  NA 133* 131* 131*  K 4.8 4.4 4.5  CL 100 98 100  CO2 21* 19* 23  GLUCOSE 247* 266* 232*  BUN 25* 27* 19  CREATININE 2.34* 2.16* 1.86*  CALCIUM 9.8 9.3 8.9   GFR: Estimated Creatinine Clearance: 48.5 mL/min (A) (by C-G formula based on SCr of 1.86 mg/dL (H)). Liver Function Tests: Recent Labs  Lab 04/14/23 2116  AST 39  ALT 22  ALKPHOS 55  BILITOT 0.8  PROT 8.2*  ALBUMIN 3.6   No results for input(s): "LIPASE", "AMYLASE" in the last 168 hours. No results for input(s): "AMMONIA" in the last 168 hours. Coagulation Profile: Recent Labs  Lab 04/15/23 0107  INR 1.2   Cardiac Enzymes: Recent Labs  Lab 04/15/23 0107  CKTOTAL 2,253*   BNP (last 3 results) No results for input(s): "PROBNP" in the last 8760 hours. HbA1C: Recent Labs    04/15/23 0107  HGBA1C 8.6*   CBG: Recent Labs  Lab 04/15/23 2000 04/16/23 0015 04/16/23 0439 04/16/23 0803 04/16/23 1224  GLUCAP 223* 207* 205* 225* 256*   Lipid Profile: No results for input(s): "CHOL", "HDL", "LDLCALC", "TRIG", "CHOLHDL", "LDLDIRECT" in the last 72 hours. Thyroid Function Tests: No results for input(s): "TSH", "T4TOTAL", "FREET4", "T3FREE", "THYROIDAB" in the last 72 hours. Anemia Panel: No results for input(s): "VITAMINB12", "FOLATE", "FERRITIN", "TIBC", "IRON", "RETICCTPCT" in the last 72 hours. Sepsis Labs: Recent Labs  Lab  04/15/23 0115 04/15/23 0302  LATICACIDVEN 1.3 1.3    Recent Results (from the past 240 hours)  Blood Culture (routine x 2)     Status: None (Preliminary result)   Collection Time: 04/15/23  1:07 AM   Specimen: BLOOD RIGHT HAND  Result Value Ref Range Status   Specimen Description BLOOD RIGHT HAND  Final   Special Requests   Final    BOTTLES DRAWN AEROBIC AND ANAEROBIC Blood Culture adequate volume   Culture   Final    NO GROWTH 1 DAY Performed at [facility], [address] [place], [place] [address]    Report Status PENDING  Incomplete  Blood Culture (routine x 2)     Status: Abnormal (Preliminary result)  Collection Time: 04/15/23  1:07 AM   Specimen: BLOOD RIGHT HAND  Result Value Ref Range Status   Specimen Description BLOOD RIGHT HAND  Final   Special Requests   Final    BOTTLES DRAWN AEROBIC AND ANAEROBIC Blood Culture adequate volume   Culture  Setup Time   Final    GRAM POSITIVE COCCI ANAEROBIC BOTTLE ONLY CRITICAL RESULT CALLED TO, READ BACK BY AND VERIFIED WITH: PHARMD L. Imogene Burn [id number] @ 2204 FH    Culture (A)  Final    STAPHYLOCOCCUS AUREUS SUSCEPTIBILITIES TO FOLLOW Performed at [facility], [address] [place], [place] [address]    Report Status PENDING  Incomplete  Blood Culture ID Panel (Reflexed)     Status: Abnormal   Collection Time: 04/15/23  1:07 AM  Result Value Ref Range Status   Enterococcus faecalis NOT DETECTED NOT DETECTED Final   Enterococcus Faecium NOT DETECTED NOT DETECTED Final   Listeria monocytogenes NOT DETECTED NOT DETECTED Final   Staphylococcus species DETECTED (A) NOT DETECTED Final    Comment: CRITICAL RESULT CALLED TO, READ BACK BY AND VERIFIED WITH: PHARMD L. CHEN [id number] @ 2204 FH    Staphylococcus aureus (BCID) DETECTED (A) NOT DETECTED Final    Comment: CRITICAL RESULT CALLED TO, READ BACK BY AND VERIFIED WITH: PHARMD L. Imogene Burn [id number] @ 2204 FH    Staphylococcus epidermidis NOT DETECTED NOT DETECTED Final    Staphylococcus lugdunensis NOT DETECTED NOT DETECTED Final   Streptococcus species NOT DETECTED NOT DETECTED Final   Streptococcus agalactiae NOT DETECTED NOT DETECTED Final   Streptococcus pneumoniae NOT DETECTED NOT DETECTED Final   Streptococcus pyogenes NOT DETECTED NOT DETECTED Final   A.calcoaceticus-baumannii NOT DETECTED NOT DETECTED Final   Bacteroides fragilis NOT DETECTED NOT DETECTED Final   Enterobacterales NOT DETECTED NOT DETECTED Final   Enterobacter cloacae complex NOT DETECTED NOT DETECTED Final   Escherichia coli NOT DETECTED NOT DETECTED Final   Klebsiella aerogenes NOT DETECTED NOT DETECTED Final   Klebsiella oxytoca NOT DETECTED NOT DETECTED Final   Klebsiella pneumoniae NOT DETECTED NOT DETECTED Final   Proteus species NOT DETECTED NOT DETECTED Final   Salmonella species NOT DETECTED NOT DETECTED Final   Serratia marcescens NOT DETECTED NOT DETECTED Final   Haemophilus influenzae NOT DETECTED NOT DETECTED Final   Neisseria meningitidis NOT DETECTED NOT DETECTED Final   Pseudomonas aeruginosa NOT DETECTED NOT DETECTED Final   Stenotrophomonas maltophilia NOT DETECTED NOT DETECTED Final   Candida albicans NOT DETECTED NOT DETECTED Final   Candida auris NOT DETECTED NOT DETECTED Final   Candida glabrata NOT DETECTED NOT DETECTED Final   Candida krusei NOT DETECTED NOT DETECTED Final   Candida parapsilosis NOT DETECTED NOT DETECTED Final   Candida tropicalis NOT DETECTED NOT DETECTED Final   Cryptococcus neoformans/gattii NOT DETECTED NOT DETECTED Final   Meth resistant mecA/C and MREJ NOT DETECTED NOT DETECTED Final    Comment: Performed at [facility], [address], [place], [place] [address]  Urine Culture     Status: None (Preliminary result)   Collection Time: 04/15/23  3:49 AM   Specimen: Urine, Clean Catch  Result Value Ref Range Status   Specimen Description URINE, CLEAN CATCH  Final   Special Requests NONE  Final   Culture   Final    CULTURE  REINCUBATED FOR BETTER GROWTH Performed at [facility], [address], [place], [place] [address]    Report Status PENDING  Incomplete  Radiology Studies: MR FOOT LEFT WO CONTRAST Result Date: 04/15/2023 CLINICAL DATA:  Foot swelling, diabetes. Left second toe amputation 2 years ago. EXAM: MRI OF THE LEFT FOOT WITHOUT CONTRAST TECHNIQUE: Multiplanar, multisequence MR imaging of the left foot was performed. No intravenous contrast was administered. COMPARISON:  Radiographs 04/15/2023 FINDINGS: Bones/Joint/Cartilage Prior amputation of the second toe at the mid proximal phalangeal level. There is abnormal edema in the remaining proximal phalanx of the second toe compatible with osteomyelitis. Substantial degenerative arthropathy in the midfoot and along the Lisfranc joint. Ligaments Lisfranc ligament intact. Muscles and Tendons Muscular atrophy in the forefoot. Soft tissues Along the dorsal lateral margin of the remaining proximal phalanx of the second toe, a 9 by 9 by 8 mm (volume = 300 mm^3) fluid signal intensity collection is present, probably a small abscess. Surrounding inflammatory findings compatible with cellulitis. Mild intermetatarsal bursitis between the heads of the first and second, and second and third metatarsals. Dorsal subcutaneous edema in the forefoot, cellulitis not excluded. IMPRESSION: 1. 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 2. 9 mm in diameter abscess along the dorsal-lateral margin of the remaining proximal phalanx second toe with surrounding cellulitis. 3. Dorsal subcutaneous edema in the forefoot, cellulitis not excluded. 4. Mild intermetatarsal bursitis between the heads of the first and second, and second and third metatarsals. 5. Substantial degenerative arthropathy in the midfoot and along the Lisfranc joint. 6. Muscular atrophy in the forefoot. Electronically Signed   By: Gaylyn Rong M.D.   On: 04/15/2023 16:11   ECHOCARDIOGRAM  COMPLETE Result Date: 04/15/2023    ECHOCARDIOGRAM REPORT   Patient Name:   Adam Ponce Date of Exam: 04/15/2023 Medical Rec #:  [id number]     Height:       71.0 in Accession #:    [id number]    Weight:       277.8 lb Date of Birth:  February 03, 1951      BSA:          2.424 m Patient Age:    72 years      BP:           126/81 mmHg Patient Gender: M             HR:           64 bpm. Exam Location:  Inpatient Procedure: 2D Echo, Cardiac Doppler, Color Doppler and Intracardiac            Opacification Agent Indications:    Syncope  History:        Patient has no prior history of Echocardiogram examinations.                 CHF; Risk Factors:Diabetes and Hypertension.  Sonographer:    Vern Claude Referring Phys: [id number] HYQMVHQIO RATHORE  Sonographer Comments: Image acquisition challenging due to patient body habitus. IMPRESSIONS  1. Left ventricular ejection fraction, by estimation, is 50 to 55%. The left ventricle has low normal function. The left ventricle has no regional wall motion abnormalities. Left ventricular diastolic parameters were normal.  2. Right ventricular systolic function is normal. The right ventricular size is normal. There is moderately elevated pulmonary artery systolic pressure.  3. The mitral valve is normal in structure. Trivial mitral valve regurgitation. No evidence of mitral stenosis.  4. The aortic valve is tricuspid. Aortic valve regurgitation is not visualized. No aortic stenosis is present. Comparison(s): No prior Echocardiogram. FINDINGS  Left Ventricle: Left ventricular ejection fraction, by estimation, is 50 to  55%. The left ventricle has low normal function. The left ventricle has no regional wall motion abnormalities. Definity contrast agent was given IV to delineate the left ventricular endocardial borders. The left ventricular internal cavity size was normal in size. There is no left ventricular hypertrophy. Left ventricular diastolic parameters were normal. Right Ventricle: The  right ventricular size is normal. Right ventricular systolic function is normal. There is moderately elevated pulmonary artery systolic pressure. The tricuspid regurgitant velocity is 3.56 m/s, and with an assumed right atrial pressure of 3 mmHg, the estimated right ventricular systolic pressure is 53.7 mmHg. Left Atrium: Left atrial size was normal in size. Right Atrium: Right atrial size was normal in size. Pericardium: There is no evidence of pericardial effusion. Mitral Valve: The mitral valve is normal in structure. Trivial mitral valve regurgitation. No evidence of mitral valve stenosis. MV peak gradient, 4.7 mmHg. The mean mitral valve gradient is 1.5 mmHg. Tricuspid Valve: The tricuspid valve is normal in structure. Tricuspid valve regurgitation is mild . No evidence of tricuspid stenosis. Aortic Valve: The aortic valve is tricuspid. Aortic valve regurgitation is not visualized. No aortic stenosis is present. Aortic valve mean gradient measures 3.8 mmHg. Aortic valve peak gradient measures 7.0 mmHg. Aortic valve area, by VTI measures 1.97 cm. Pulmonic Valve: The pulmonic valve was normal in structure. Pulmonic valve regurgitation is mild. No evidence of pulmonic stenosis. Aorta: The aortic root is normal in size and structure. Venous: The inferior vena cava was not well visualized. IAS/Shunts: The interatrial septum was not well visualized.  LEFT VENTRICLE PLAX 2D LVIDd:         5.20 cm      Diastology LVIDs:         3.50 cm      LV e' medial:    8.27 cm/s LV PW:         0.90 cm      LV E/e' medial:  10.7 LV IVS:        0.90 cm      LV e' lateral:   10.23 cm/s LVOT diam:     1.90 cm      LV E/e' lateral: 8.7 LV SV:         52 LV SV Index:   21 LVOT Area:     2.84 cm  LV Volumes (MOD) LV vol d, MOD A2C: 185.0 ml LV vol d, MOD A4C: 200.0 ml LV vol s, MOD A2C: 76.6 ml LV vol s, MOD A4C: 85.4 ml LV SV MOD A2C:     108.4 ml LV SV MOD A4C:     200.0 ml LV SV MOD BP:      107.3 ml RIGHT VENTRICLE RV Basal diam:   4.00 cm RV Mid diam:    3.10 cm RV S prime:     13.50 cm/s TAPSE (M-mode): 2.4 cm LEFT ATRIUM           Index        RIGHT ATRIUM           Index LA diam:      3.80 cm 1.57 cm/m   RA Area:     19.80 cm LA Vol (A2C): 71.9 ml 29.66 ml/m  RA Volume:   50.80 ml  20.95 ml/m LA Vol (A4C): 45.4 ml 18.73 ml/m  AORTIC VALVE                    PULMONIC VALVE AV Area (Vmax):    2.26  cm     PV Vmax:          1.04 m/s AV Area (Vmean):   2.22 cm     PV Peak grad:     4.3 mmHg AV Area (VTI):     1.97 cm     PR End Diast Vel: 2.90 msec AV Vmax:           132.25 cm/s AV Vmean:          84.950 cm/s AV VTI:            0.261 m AV Peak Grad:      7.0 mmHg AV Mean Grad:      3.8 mmHg LVOT Vmax:         105.20 cm/s LVOT Vmean:        66.400 cm/s LVOT VTI:          0.182 m LVOT/AV VTI ratio: 0.70  AORTA Ao Root diam: 3.30 cm Ao Asc diam:  2.80 cm MITRAL VALVE               TRICUSPID VALVE MV Area (PHT): 4.99 cm    TR Peak grad:   50.7 mmHg MV Area VTI:   1.91 cm    TR Vmax:        356.00 cm/s MV Peak grad:  4.7 mmHg MV Mean grad:  1.5 mmHg    SHUNTS MV Vmax:       1.09 m/s    Systemic VTI:  0.18 m MV Vmean:      60.8 cm/s   Systemic Diam: 1.90 cm MV Decel Time: 152 msec MV E velocity: 88.80 cm/s MV A velocity: 97.90 cm/s MV E/A ratio:  0.91 Olga Millers MD Electronically signed by Olga Millers MD Signature Date/Time: 04/15/2023/1:03:34 PM    Final    DG Toe 2nd Left Result Date: 04/15/2023 CLINICAL DATA:  Evaluate for osteomyelitis.  Fall. EXAM: LEFT SECOND TOE COMPARISON:  None Available. 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 No acute bony abnormality. Specifically, no fracture, subluxation, or dislocation. No bone destruction to suggest osteomyelitis. Degenerative changes in the midfoot. IMPRESSION: No acute bony abnormality. Electronically Signed   By: Charlett Nose M.D.   On: 04/15/2023 00:51   DG Chest Port 1 View Result Date: 04/15/2023 CLINICAL DATA:  Questionable sepsis -  evaluate for abnormality. Fall. EXAM: PORTABLE CHEST 1 VIEW COMPARISON:  05/13/2009 FINDINGS: Left pacer in place with leads in the right atrium and right ventricle. Cardiomegaly, vascular congestion. No confluent opacity, overt edema or effusions. No pneumothorax. No acute bony abnormality. IMPRESSION: Cardiomegaly, vascular congestion. Electronically Signed   By: Charlett Nose M.D.   On: 04/15/2023 00:48   CT Head Wo Contrast Result Date: 04/14/2023 CLINICAL DATA:  Head trauma, minor (Age >= 65y); Neck trauma (Age >= 65y) EXAM: CT HEAD WITHOUT CONTRAST CT CERVICAL SPINE WITHOUT CONTRAST TECHNIQUE: Multidetector CT imaging of the head and cervical spine was performed following the standard protocol without intravenous contrast. Multiplanar CT image reconstructions of the cervical spine were also generated. RADIATION DOSE REDUCTION: This exam was performed according to the departmental dose-optimization program which includes automated exposure control, adjustment of the mA and/or kV according to patient size and/or use of iterative reconstruction technique. COMPARISON:  None Available. FINDINGS: CT HEAD FINDINGS Brain: Patchy and confluent areas of decreased attenuation are noted throughout the deep and periventricular white matter of the cerebral hemispheres bilaterally, compatible with chronic microvascular ischemic  disease. No evidence of large-territorial acute infarction. No parenchymal hemorrhage. No mass lesion. No extra-axial collection. No mass effect or midline shift. No hydrocephalus. Basilar cisterns are patent. Empty sella. Vascular: No hyperdense vessel. Atherosclerotic calcifications are present within the cavernous internal carotid arteries. Skull: No acute fracture or focal lesion. Sinuses/Orbits: Right mastoidectomy. Left mastoid effusion. Paranasal sinuses are clear. The orbits are unremarkable. Other: None. CT CERVICAL SPINE FINDINGS Alignment: Grade 1 anterolisthesis of C3 on C4 and C4 on  C5. Skull base and vertebrae: Multilevel severe degenerative changes of the spine. Associated severe right C2-C3, left C3-C4, right C4-C5 osseous neural foraminal stenosis. No acute fracture. No aggressive appearing focal osseous lesion or focal pathologic process. Soft tissues and spinal canal: No prevertebral fluid or swelling. No visible canal hematoma. Upper chest: Unremarkable. Other: None. IMPRESSION: 1. No acute intracranial abnormality. 2. No acute displaced fracture or traumatic listhesis of the cervical spine. 3. Multilevel severe degenerative changes of the spine. Associated severe right C2-C3, left C3-C4, right C4-C5 osseous neural foraminal stenosis. 4. Empty sella. Findings is often a normal anatomic variant but can be associated with idiopathic intracranial hypertension (pseudotumor cerebri). Electronically Signed   By: Tish Frederickson M.D.   On: 04/14/2023 19:43   CT Cervical Spine Wo Contrast Result Date: 04/14/2023 CLINICAL DATA:  Head trauma, minor (Age >= 65y); Neck trauma (Age >= 65y) EXAM: CT HEAD WITHOUT CONTRAST CT CERVICAL SPINE WITHOUT CONTRAST TECHNIQUE: Multidetector CT imaging of the head and cervical spine was performed following the standard protocol without intravenous contrast. Multiplanar CT image reconstructions of the cervical spine were also generated. RADIATION DOSE REDUCTION: This exam was performed according to the departmental dose-optimization program which includes automated exposure control, adjustment of the mA and/or kV according to patient size and/or use of iterative reconstruction technique. COMPARISON:  None Available. FINDINGS: CT HEAD FINDINGS Brain: Patchy and confluent areas of decreased attenuation are noted throughout the deep and periventricular white matter of the cerebral hemispheres bilaterally, compatible with chronic microvascular ischemic disease. No evidence of large-territorial acute infarction. No parenchymal hemorrhage. No mass lesion. No  extra-axial collection. No mass effect or midline shift. No hydrocephalus. Basilar cisterns are patent. Empty sella. Vascular: No hyperdense vessel. Atherosclerotic calcifications are present within the cavernous internal carotid arteries. Skull: No acute fracture or focal lesion. Sinuses/Orbits: Right mastoidectomy. Left mastoid effusion. Paranasal sinuses are clear. The orbits are unremarkable. Other: None. CT CERVICAL SPINE FINDINGS Alignment: Grade 1 anterolisthesis of C3 on C4 and C4 on C5. Skull base and vertebrae: Multilevel severe degenerative changes of the spine. Associated severe right C2-C3, left C3-C4, right C4-C5 osseous neural foraminal stenosis. No acute fracture. No aggressive appearing focal osseous lesion or focal pathologic process. Soft tissues and spinal canal: No prevertebral fluid or swelling. No visible canal hematoma. Upper chest: Unremarkable. Other: None. IMPRESSION: 1. No acute intracranial abnormality. 2. No acute displaced fracture or traumatic listhesis of the cervical spine. 3. Multilevel severe degenerative changes of the spine. Associated severe right C2-C3, left C3-C4, right C4-C5 osseous neural foraminal stenosis. 4. Empty sella. Findings is often a normal anatomic variant but can be associated with idiopathic intracranial hypertension (pseudotumor cerebri). Electronically Signed   By: Tish Frederickson M.D.   On: 04/14/2023 19:43   DG Ankle Complete Left Result Date: 04/14/2023 CLINICAL DATA:  Knee gave way patient fell. EXAM: LEFT ANKLE COMPLETE - 3+ VIEW COMPARISON:  None Available. FINDINGS: Mild swelling about the left ankle. No acute fracture or dislocation. Degenerative changes about the midfoot.  IMPRESSION: Mild swelling about the left ankle without acute fracture. Electronically Signed   By: Minerva Fester M.D.   On: 04/14/2023 19:30   DG Knee Complete 4 Views Right Result Date: 04/14/2023 CLINICAL DATA:  Fall after knee scaphoid EXAM: LEFT KNEE - COMPLETE 4+  VIEW; RIGHT KNEE - COMPLETE 4+ VIEW COMPARISON:  None Available. FINDINGS: No acute fracture or dislocation. 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 There is moderate to advanced medial compartment narrowing on the left and moderate to advanced medial and lateral compartment narrowing on the right. No knee joint effusion. Vascular calcifications. IMPRESSION: 1. No acute fracture or dislocation. 2. 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 Electronically Signed   By: Minerva Fester M.D.   On: 04/14/2023 19:28   DG Knee Complete 4 Views Left Result Date: 04/14/2023 CLINICAL DATA:  Fall after knee scaphoid EXAM: LEFT KNEE - COMPLETE 4+ VIEW; RIGHT KNEE - COMPLETE 4+ VIEW COMPARISON:  None Available. FINDINGS: No acute fracture or dislocation. 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 There is moderate to advanced medial compartment narrowing on the left and moderate to advanced medial and lateral compartment narrowing on the right. No knee joint effusion. Vascular calcifications. IMPRESSION: 1. No acute fracture or dislocation. 2. 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 Electronically Signed   By: Minerva Fester M.D.   On: 04/14/2023 19:28   DG Ankle Complete Right Result Date: 04/14/2023 CLINICAL DATA:  Ankle pain following fall, initial encounter EXAM: RIGHT ANKLE - COMPLETE 3+ VIEW COMPARISON:  None Available. FINDINGS: No acute fracture or dislocation is noted. Flattening of the plantar arch is noted. Tarsal degenerative changes are seen. IMPRESSION: Chronic changes without acute abnormality. Electronically Signed   By: Alcide Clever M.D.   On: 04/14/2023 19:25   DG Hip Unilat With Pelvis 2-3 Views Left Result Date: 04/14/2023 CLINICAL DATA:  Hip pain after fall. EXAM: DG HIP (WITH OR WITHOUT PELVIS) 2-3V RIGHT; DG HIP (WITH OR WITHOUT PELVIS) 2-3V LEFT COMPARISON:  None Available. FINDINGS: Bilateral THA. No radiographic evidence of loosening. No  acute fracture or dislocation. Heterotopic ossification about both proximal femurs. Pelvic phleboliths. IMPRESSION: Negative. Electronically Signed   By: Minerva Fester M.D.   On: 04/14/2023 19:25   DG Hip Unilat With Pelvis 2-3 Views Right Result Date: 04/14/2023 CLINICAL DATA:  Hip pain after fall. EXAM: DG HIP (WITH OR WITHOUT PELVIS) 2-3V RIGHT; DG HIP (WITH OR WITHOUT PELVIS) 2-3V LEFT COMPARISON:  None Available. FINDINGS: Bilateral THA. No radiographic evidence of loosening. No acute fracture or dislocation. Heterotopic ossification about both proximal femurs. Pelvic phleboliths. IMPRESSION: Negative. Electronically Signed   By: Minerva Fester M.D.   On: 04/14/2023 19:25        Scheduled Meds:  acetaminophen  1,000 mg Oral QID   darunavir-cobicistat  1 tablet Oral Q breakfast   emtricitabine-tenofovir AF  1 tablet Oral Daily   insulin aspart  0-9 Units Subcutaneous Q4H   insulin glargine-yfgn  10 Units Subcutaneous QHS   Continuous Infusions:  ceFEPime (MAXIPIME) IV 2 g (04/16/23 1225)   metronidazole 100 mL/hr at 04/16/23 0528   [START ON 04/17/2023] vancomycin       LOS: 1 day    Time spent: 45 minutes spent on chart review, discussion with nursing staff, consultants, updating family and interview/physical exam; more than 50% of that time was spent in counseling and/or coordination of care.    Joseph Art, DO [facility] Available via Epic secure chat 7am-7pm After these hours, please  refer to coverage provider listed on [email or web address] 04/16/2023, 12:34 PM

## 2023-04-16 NOTE — Evaluation (Signed)
Physical Therapy Evaluation Patient Details Name: Adam Ponce MRN: [id number] DOB: Jul 07, 1950 Today's Date: 04/16/2023  History of Present Illness  72 y.o. male presents to [facility] hospital on 04/14/2023 after a fall. Pt admtited with concern for L foot infection and rhabdomyolysis. PMH includes aflutter s/p PPM, DMII, toe amputation, HIV, HTN, HLD, GERD, COPD, CKD III, OSA, BPH.  Clinical Impression  Pt presents to PT with deficits in strength, power, functional mobility, balance, endurance, gait. Pt is limited by generalized weakness and pain in BLE. Pt requires significant physical assistance to perform all functional mobility and is unable to progress to ambulation due to high risk for falls. PT recommends short term inpatient PT services at the time of discharge.        If plan is discharge home, recommend the following: Two people to help with walking and/or transfers;Two people to help with bathing/dressing/bathroom;Assistance with cooking/housework;Assist for transportation;Help with stairs or ramp for entrance   Can travel by private vehicle   No    Equipment Recommendations  (TBD pending progress)  Recommendations for Other Services       Functional Status Assessment Patient has had a recent decline in their functional status and demonstrates the ability to make significant improvements in function in a reasonable and predictable amount of time.     Precautions / Restrictions Precautions Precautions: Fall Restrictions Weight Bearing Restrictions Per Provider Order: No      Mobility  Bed Mobility Overal bed mobility: Needs Assistance Bed Mobility: Rolling, Supine to Sit, Sit to Supine Rolling: Min assist   Supine to sit: Mod assist Sit to supine: Max assist        Transfers Overall transfer level: Needs assistance Equipment used: 1 person hand held assist Transfers: Sit to/from Stand Sit to Stand: Max assist                Ambulation/Gait                   Stairs            Wheelchair Mobility     Tilt Bed    Modified Rankin (Stroke Patients Only)       Balance Overall balance assessment: Needs assistance Sitting-balance support: No upper extremity supported, Feet supported Sitting balance-Leahy Scale: Fair     Standing balance support: Bilateral upper extremity supported Standing balance-Leahy Scale: Poor Standing balance comment: maxA                             Pertinent Vitals/Pain Pain Assessment Pain Assessment: Faces Faces Pain Scale: Hurts even more Pain Location: BLE Pain Descriptors / Indicators: Grimacing Pain Intervention(s): Monitored during session    Home Living Family/patient expects to be discharged to:: Private residence Living Arrangements: Alone Available Help at Discharge: Family;Available PRN/intermittently (sister) Type of Home: Apartment Home Access: Level entry       Home Layout: One level Home Equipment: Rollator (4 wheels);Cane - single point      Prior Function Prior Level of Function : Independent/Modified Independent             Mobility Comments: ambulatory with SPC or rollator       Extremity/Trunk Assessment   Upper Extremity Assessment Upper Extremity Assessment: Generalized weakness    Lower Extremity Assessment Lower Extremity Assessment: Generalized weakness    Cervical / Trunk Assessment Cervical / Trunk Assessment: Other exceptions Cervical / Trunk Exceptions: body habitus  Communication  Communication Communication: No apparent difficulties Cueing Techniques: Verbal cues  Cognition Arousal: Alert Behavior During Therapy: WFL for tasks assessed/performed Overall Cognitive Status: Within Functional Limits for tasks assessed                                          General Comments General comments (skin integrity, edema, etc.): VSS on RA    Exercises     Assessment/Plan    PT Assessment Patient  needs continued PT services  PT Problem List Decreased strength;Decreased activity tolerance;Decreased balance;Decreased mobility;Decreased knowledge of use of DME;Decreased safety awareness;Decreased knowledge of precautions       PT Treatment Interventions DME instruction;Gait training;Functional mobility training;Therapeutic activities;Therapeutic exercise;Balance training;Neuromuscular re-education;Patient/family education    PT Goals (Current goals can be found in the Care Plan section)  Acute Rehab PT Goals Patient Stated Goal: to improve strength, return to ambulation PT Goal Formulation: With patient Time For Goal Achievement: 04/30/23 Potential to Achieve Goals: Fair    Frequency Min 1X/week     Co-evaluation               AM-PAC PT "6 Clicks" Mobility  Outcome Measure Help needed turning from your back to your side while in a flat bed without using bedrails?: A Little Help needed moving from lying on your back to sitting on the side of a flat bed without using bedrails?: A Lot Help needed moving to and from a bed to a chair (including a wheelchair)?: Total Help needed standing up from a chair using your arms (e.g., wheelchair or bedside chair)?: A Lot Help needed to walk in hospital room?: Total Help needed climbing 3-5 steps with a railing? : Total 6 Click Score: 10    End of Session Equipment Utilized During Treatment: Gait belt Activity Tolerance: Patient limited by fatigue Patient left: in bed;with call bell/phone within reach;with bed alarm set Nurse Communication: Mobility status;Need for lift equipment PT Visit Diagnosis: Other abnormalities of gait and mobility (R26.89);Muscle weakness (generalized) (M62.81)    Time: 2952-8413 PT Time Calculation (min) (ACUTE ONLY): 30 min   Charges:   PT Evaluation $PT Eval Low Complexity: 1 Low PT Treatments $Therapeutic Activity: 8-22 mins PT General Charges $$ ACUTE PT VISIT: 1 Visit         Arlyss Gandy,  PT, DPT Acute Rehabilitation Office [phone]   Arlyss Gandy 04/16/2023, 12:45 PM

## 2023-04-16 NOTE — Plan of Care (Signed)
  Problem: Fluid Volume: Goal: Ability to maintain a balanced intake and output will improve Outcome: Progressing   Problem: Tissue Perfusion: Goal: Adequacy of tissue perfusion will improve Outcome: Progressing   Problem: Clinical Measurements: Goal: Ability to maintain clinical measurements within normal limits will improve Outcome: Progressing

## 2023-04-16 NOTE — Progress Notes (Signed)
04/16/2023 2030 Received pt to room 4N-01 from 2W.  Pt is A&Ox4, with some C/O bilateral lower extremity pain.  Tele monitor applied and CCMD notified.  Oriented to room, call light and bed.  Call bell in reach, bed alarm on. Adam Ponce

## 2023-04-16 NOTE — Progress Notes (Signed)
Bilateral ABI has been completed.  Results can be found in chart review under CV Proc.  04/16/2023 5:34 PM  Maricella Filyaw Durenda Age, RVT.

## 2023-04-16 NOTE — Consult Note (Addendum)
[facility]                                                                                        Patient Identification: Patient Name: Adam Ponce MRN: [id number] Admit Date: 04/14/2023  5:11 PM Today's Date: 04/16/2023 Reason for consult: Bacteremia Requesting provider: Riley Lam consult  Principal Problem:   Diabetic foot infection [facility]) Active Problems:   Type 2 diabetes mellitus (HCC)   Essential hypertension, benign   Rhabdomyolysis   Acute-on-chronic kidney injury (HCC)   CHF (congestive heart failure) (HCC)   UTI (urinary tract infection)   Syncope   Generalized weakness   Antibiotics:  Vancomycin 12/26- Cefepime 12/26- Metronidazole 12/26-  Lines/Hardware: b/l hip replacement   Assessment # MSSA bacteremia 2/2  # 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 Will start Unasyn, pharmacy to dose  - Repeat 2 sets of blood cx - TEE ordered  - Consult Orthopedics - Engage EP given presence of PPM and staph bacteremia for possible removal  - Monitor CBC and BMP - continue home ART with prezcobix and descovy, fu HIV RNA - d/w Primary team   Rest of the management as per the primary team. Please call with questions or concerns.  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  He was found on the floor by staff at his facility and they called 911.  History of left second toe amputation approx 2 years ago and drainage from that area for the last 3 weeks including pain while walking.  Denies fever or chills.  He had 1 episode of vomiting prior to arrival but denies having abdominal pain or constipation or diarrhea.  Denies any cough or chest pain or shortness of breath  At ED, afebrile and vital stable Labs remarkable for leukocytosis WBC 15.3 glucose 247 creatinine 2.3, CRP 18.4 CK2253 lactic acid 1.3, UA with small leukocytes and many bacteria, urine cx 70,000 colonies  Code sepsis activated.  Started on vancomycin, cefepime, metronidazole and IVF MRI Left foot 12/27 1. 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 2. 9 mm in diameter abscess along the dorsal-lateral margin of the remaining proximal phalanx second toe with surrounding cellulitis. 3. Dorsal subcutaneous edema in the forefoot, cellulitis not excluded. 4. Mild intermetatarsal bursitis between the heads of the first and second, and second and third metatarsals. 5. Substantial degenerative arthropathy in the midfoot and along the Lisfranc joint. 6. Muscular atrophy in the forefoot.  ROS: General- Denies fever, chills, loss  of appetite and loss of weight HEENT - Denies headache, blurry vision, neck pain, sinus pain Chest - Denies any chest pain, SOB or cough CVS- Denies any dizziness/lightheadedness, syncopal attacks, palpitations Abdomen- Denies any nausea, vomiting, abdominal pain, hematochezia and diarrhea Neuro - Denies any weakness, numbness, tingling sensation Psych - Denies any changes in mood irritability or depressive symptoms GU- Denies any burning, dysuria, hematuria or increased frequency of urination Skin - denies any rashes/lesions MSK - denies any joint pain/swelling or restricted ROM   Past  Medical History:  Diagnosis Date   Arthritis    Asthma    Constipation    Diabetes mellitus    Heart disease    HIV disease (HCC) 04/19/1993   Hypertension    Past Surgical History:  Procedure Laterality Date   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   JOINT REPLACEMENT  2010   Bilateral hip    Scheduled Meds:  darunavir-cobicistat  1 tablet Oral Q breakfast   emtricitabine-tenofovir AF  1 tablet Oral Daily   insulin aspart  0-9 Units Subcutaneous Q4H   Continuous Infusions:  ceFEPime (MAXIPIME) IV Stopped (04/15/23 2158)   metronidazole 100 mL/hr at 04/16/23 0528   vancomycin 200 mL/hr at 04/16/23 0528   PRN Meds:.acetaminophen **OR** acetaminophen, HYDROcodone-acetaminophen  Allergies  Allergen Reactions   Ciprofloxacin Other (See Comments)    unknown   Ciprofloxacin-Dexamethasone Other (See Comments)    unknown   Codeine    Social History   Socioeconomic History   Marital status: Single    Spouse name: Not on file   Number of children: Not on file   Years of education: Not on file   Highest education level: 12th grade  Occupational History   Not on file  Tobacco Use   Smoking status: Former    Current packs/day: 1.00    Types: Cigarettes   Smokeless tobacco: Never  Substance and Sexual Activity   Alcohol use: No    Comment: 2 pints a week quit 2001   Drug use: No   Sexual activity: Not on file  Other Topics Concern   Not on file  Social History Narrative   No caffeine use   Lives at home with partner    Social Drivers of Health   Financial Resource Strain: Not on file  Food Insecurity: Food Insecurity Present (04/15/2023)   Hunger Vital Sign    Worried About Running Out of Food in the Last Year: Sometimes true    Ran Out of Food in the Last Year: Never true  Transportation Needs: Unmet Transportation Needs (04/15/2023)   PRAPARE - Administrator, Civil Service (Medical): Yes    Lack of Transportation (Non-Medical): Yes  Physical  Activity: Not on file  Stress: Not on file  Social Connections: Not on file  Intimate Partner Violence: Unknown (04/15/2023)   Humiliation, Afraid, Rape, and Kick questionnaire    Fear of Current or Ex-Partner: No    Emotionally Abused: Patient declined    Physically Abused: No    Sexually Abused: No   Family History  Problem Relation Age of Onset   Heart attack Father     Vitals BP 139/64 (BP Location: Left Arm)   Pulse 67   Temp 98.8 F (37.1 C) (Oral)   Resp 16   Ht 5\' 11"  (1.803 m)   Wt 126 kg   SpO2 96%   BMI 38.74 kg/m    Physical Exam Constitutional:  elderly male lying in the  bed and shaving, not in acute distress    Comments: HEENT wnl  Cardiovascular:     Rate and Rhythm: Normal rate and regular rhythm.     Heart sounds: s1s2  Pulmonary:     Effort: Pulmonary effort is normal.     Comments: Normal breath sounds   Abdominal:     Palpations: Abdomen is soft.     Tenderness: non distended and non tender   Musculoskeletal:        General: No swelling or tenderness in peripheral joints  Skin:    Comments: no rashes, left 2nd toe amputated site with open wound and yellowis debris. Rt toe amputated, no wounds. PPM site with no erythema, tenderness, warmth or fluctuance  Neurological:     General: awake, alert and oriented, grossly non focal. Follows commands   Psychiatric:        Mood and Affect: Mood normal.    Pertinent Microbiology Results for orders placed or performed during the hospital encounter of 04/14/23  Blood Culture (routine x 2)     Status: None (Preliminary result)   Collection Time: 04/15/23  1:07 AM   Specimen: BLOOD RIGHT HAND  Result Value Ref Range Status   Specimen Description BLOOD RIGHT HAND  Final   Special Requests   Final    BOTTLES DRAWN AEROBIC AND ANAEROBIC Blood Culture adequate volume   Culture   Final    NO GROWTH < 12 HOURS Performed at [facility], [address], [place], [place] [address]    Report  Status PENDING  Incomplete  Blood Culture (routine x 2)     Status: None (Preliminary result)   Collection Time: 04/15/23  1:07 AM   Specimen: BLOOD RIGHT HAND  Result Value Ref Range Status   Specimen Description BLOOD RIGHT HAND  Final   Special Requests   Final    BOTTLES DRAWN AEROBIC AND ANAEROBIC Blood Culture adequate volume   Culture  Setup Time   Final    GRAM POSITIVE COCCI ANAEROBIC BOTTLE ONLY CRITICAL RESULT CALLED TO, READ BACK BY AND VERIFIED WITH: PHARMD L. Imogene Burn [id number] @ 2204 FH Performed at [facility], [address], [place], [place] [address]    Culture GRAM POSITIVE COCCI  Final   Report Status PENDING  Incomplete  Blood Culture ID Panel (Reflexed)     Status: Abnormal   Collection Time: 04/15/23  1:07 AM  Result Value Ref Range Status   Enterococcus faecalis NOT DETECTED NOT DETECTED Final   Enterococcus Faecium NOT DETECTED NOT DETECTED Final   Listeria monocytogenes NOT DETECTED NOT DETECTED Final   Staphylococcus species DETECTED (A) NOT DETECTED Final    Comment: CRITICAL RESULT CALLED TO, READ BACK BY AND VERIFIED WITH: PHARMD L. CHEN [id number] @ 2204 FH    Staphylococcus aureus (BCID) DETECTED (A) NOT DETECTED Final    Comment: CRITICAL RESULT CALLED TO, READ BACK BY AND VERIFIED WITH: PHARMD L. Imogene Burn [id number] @ 2204 FH    Staphylococcus epidermidis NOT DETECTED NOT DETECTED Final   Staphylococcus lugdunensis NOT DETECTED NOT DETECTED Final   Streptococcus species NOT DETECTED NOT DETECTED Final   Streptococcus agalactiae NOT DETECTED NOT DETECTED Final   Streptococcus pneumoniae NOT DETECTED NOT DETECTED Final   Streptococcus pyogenes NOT DETECTED NOT DETECTED Final   A.calcoaceticus-baumannii NOT DETECTED NOT DETECTED Final   Bacteroides fragilis NOT DETECTED NOT DETECTED Final   Enterobacterales NOT DETECTED NOT DETECTED Final   Enterobacter cloacae complex NOT DETECTED NOT  DETECTED Final   Escherichia coli NOT DETECTED NOT DETECTED Final    Klebsiella aerogenes NOT DETECTED NOT DETECTED Final   Klebsiella oxytoca NOT DETECTED NOT DETECTED Final   Klebsiella pneumoniae NOT DETECTED NOT DETECTED Final   Proteus species NOT DETECTED NOT DETECTED Final   Salmonella species NOT DETECTED NOT DETECTED Final   Serratia marcescens NOT DETECTED NOT DETECTED Final   Haemophilus influenzae NOT DETECTED NOT DETECTED Final   Neisseria meningitidis NOT DETECTED NOT DETECTED Final   Pseudomonas aeruginosa NOT DETECTED NOT DETECTED Final   Stenotrophomonas maltophilia NOT DETECTED NOT DETECTED Final   Candida albicans NOT DETECTED NOT DETECTED Final   Candida auris NOT DETECTED NOT DETECTED Final   Candida glabrata NOT DETECTED NOT DETECTED Final   Candida krusei NOT DETECTED NOT DETECTED Final   Candida parapsilosis NOT DETECTED NOT DETECTED Final   Candida tropicalis NOT DETECTED NOT DETECTED Final   Cryptococcus neoformans/gattii NOT DETECTED NOT DETECTED Final   Meth resistant mecA/C and MREJ NOT DETECTED NOT DETECTED Final    Comment: Performed at [facility], [address] [place], [place] [address]   Pertinent Lab seen by me:    Latest Ref Rng & Units 04/15/2023    1:07 AM 04/14/2023    9:16 PM 08/21/2008    4:10 AM  CBC  WBC 4.0 - 10.5 K/uL 15.4  15.3  5.8   Hemoglobin 13.0 - 17.0 g/dL 60.4  54.0  98.1   Hematocrit 39.0 - 52.0 % 40.0  42.7  33.2   Platelets 150 - 400 K/uL 224  238  183       Latest Ref Rng & Units 04/15/2023    1:07 AM 04/14/2023    9:16 PM 08/21/2008    4:10 AM  CMP  Glucose 70 - 99 mg/dL 191  478  295   BUN 8 - 23 mg/dL 27  25  6    Creatinine 0.61 - 1.24 mg/dL 6.21  3.08  6.57   Sodium 135 - 145 mmol/L 131  133  133   Potassium 3.5 - 5.1 mmol/L 4.4  4.8  4.0   Chloride 98 - 111 mmol/L 98  100  103   CO2 22 - 32 mmol/L 19  21  25    Calcium 8.9 - 10.3 mg/dL 9.3  9.8  8.5   Total Protein 6.5 - 8.1 g/dL  8.2    Total Bilirubin <1.2 mg/dL  0.8    Alkaline Phos 38 - 126 U/L  55    AST 15 - 41  U/L  39    ALT 0 - 44 U/L  22      Pertinent Imagings/Other Imagings Plain films and CT images have been personally visualized and interpreted; radiology reports have been reviewed. Decision making incorporated into the Impression / Recommendations.  MR FOOT LEFT WO CONTRAST Result Date: 04/15/2023 CLINICAL DATA:  Foot swelling, diabetes. Left second toe amputation 2 years ago. EXAM: MRI OF THE LEFT FOOT WITHOUT CONTRAST TECHNIQUE: Multiplanar, multisequence MR imaging of the left foot was performed. No intravenous contrast was administered. COMPARISON:  Radiographs 04/15/2023 FINDINGS: Bones/Joint/Cartilage Prior amputation of the second toe at the mid proximal phalangeal level. There is abnormal edema in the remaining proximal phalanx of the second toe compatible with osteomyelitis. Substantial degenerative arthropathy in the midfoot and along the Lisfranc joint. Ligaments Lisfranc ligament intact. Muscles and Tendons Muscular atrophy in the forefoot. Soft tissues Along the dorsal lateral margin of the remaining proximal phalanx  of the second toe, a 9 by 9 by 8 mm (volume = 300 mm^3) fluid signal intensity collection is present, probably a small abscess. Surrounding inflammatory findings compatible with cellulitis. Mild intermetatarsal bursitis between the heads of the first and second, and second and third metatarsals. Dorsal subcutaneous edema in the forefoot, cellulitis not excluded. IMPRESSION: 1. 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 2. 9 mm in diameter abscess along the dorsal-lateral margin of the remaining proximal phalanx second toe with surrounding cellulitis. 3. Dorsal subcutaneous edema in the forefoot, cellulitis not excluded. 4. Mild intermetatarsal bursitis between the heads of the first and second, and second and third metatarsals. 5. Substantial degenerative arthropathy in the midfoot and along the Lisfranc joint. 6. Muscular atrophy in the forefoot. Electronically  Signed   By: Gaylyn Rong M.D.   On: 04/15/2023 16:11   ECHOCARDIOGRAM COMPLETE Result Date: 04/15/2023    ECHOCARDIOGRAM REPORT   Patient Name:   Adam Ponce Date of Exam: 04/15/2023 Medical Rec #:  [id number]     Height:       71.0 in Accession #:    [id number]    Weight:       277.8 lb Date of Birth:  [birth year]      BSA:          2.424 m Patient Age:    72 years      BP:           126/81 mmHg Patient Gender: M             HR:           64 bpm. Exam Location:  Inpatient Procedure: 2D Echo, Cardiac Doppler, Color Doppler and Intracardiac            Opacification Agent Indications:    Syncope  History:        Patient has no prior history of Echocardiogram examinations.                 CHF; Risk Factors:Diabetes and Hypertension.  Sonographer:    Vern Claude Referring Phys: [id number] RATHORE  Sonographer Comments: Image acquisition challenging due to patient body habitus. IMPRESSIONS  1. Left ventricular ejection fraction, by estimation, is 50 to 55%. The left ventricle has low normal function. The left ventricle has no regional wall motion abnormalities. Left ventricular diastolic parameters were normal.  2. Right ventricular systolic function is normal. The right ventricular size is normal. There is moderately elevated pulmonary artery systolic pressure.  3. The mitral valve is normal in structure. Trivial mitral valve regurgitation. No evidence of mitral stenosis.  4. The aortic valve is tricuspid. Aortic valve regurgitation is not visualized. No aortic stenosis is present. Comparison(s): No prior Echocardiogram. FINDINGS  Left Ventricle: Left ventricular ejection fraction, by estimation, is 50 to 55%. The left ventricle has low normal function. The left ventricle has no regional wall motion abnormalities. Definity contrast agent was given IV to delineate the left ventricular endocardial borders. The left ventricular internal cavity size was normal in size. There is no left ventricular  hypertrophy. Left ventricular diastolic parameters were normal. Right Ventricle: The right ventricular size is normal. Right ventricular systolic function is normal. There is moderately elevated pulmonary artery systolic pressure. The tricuspid regurgitant velocity is 3.56 m/s, and with an assumed right atrial pressure of 3 mmHg, the estimated right ventricular systolic pressure is 53.7 mmHg. Left Atrium: Left atrial size was normal in size. Right Atrium: Right atrial size was  normal in size. Pericardium: There is no evidence of pericardial effusion. Mitral Valve: The mitral valve is normal in structure. Trivial mitral valve regurgitation. No evidence of mitral valve stenosis. MV peak gradient, 4.7 mmHg. The mean mitral valve gradient is 1.5 mmHg. Tricuspid Valve: The tricuspid valve is normal in structure. Tricuspid valve regurgitation is mild . No evidence of tricuspid stenosis. Aortic Valve: The aortic valve is tricuspid. Aortic valve regurgitation is not visualized. No aortic stenosis is present. Aortic valve mean gradient measures 3.8 mmHg. Aortic valve peak gradient measures 7.0 mmHg. Aortic valve area, by VTI measures 1.97 cm. Pulmonic Valve: The pulmonic valve was normal in structure. Pulmonic valve regurgitation is mild. No evidence of pulmonic stenosis. Aorta: The aortic root is normal in size and structure. Venous: The inferior vena cava was not well visualized. IAS/Shunts: The interatrial septum was not well visualized.  LEFT VENTRICLE PLAX 2D LVIDd:         5.20 cm      Diastology LVIDs:         3.50 cm      LV e' medial:    8.27 cm/s LV PW:         0.90 cm      LV E/e' medial:  10.7 LV IVS:        0.90 cm      LV e' lateral:   10.23 cm/s LVOT diam:     1.90 cm      LV E/e' lateral: 8.7 LV SV:         52 LV SV Index:   21 LVOT Area:     2.84 cm  LV Volumes (MOD) LV vol d, MOD A2C: 185.0 ml LV vol d, MOD A4C: 200.0 ml LV vol s, MOD A2C: 76.6 ml LV vol s, MOD A4C: 85.4 ml LV SV MOD A2C:     108.4 ml LV  SV MOD A4C:     200.0 ml LV SV MOD BP:      107.3 ml RIGHT VENTRICLE RV Basal diam:  4.00 cm RV Mid diam:    3.10 cm RV S prime:     13.50 cm/s TAPSE (M-mode): 2.4 cm LEFT ATRIUM           Index        RIGHT ATRIUM           Index LA diam:      3.80 cm 1.57 cm/m   RA Area:     19.80 cm LA Vol (A2C): 71.9 ml 29.66 ml/m  RA Volume:   50.80 ml  20.95 ml/m LA Vol (A4C): 45.4 ml 18.73 ml/m  AORTIC VALVE                    PULMONIC VALVE AV Area (Vmax):    2.26 cm     PV Vmax:          1.04 m/s AV Area (Vmean):   2.22 cm     PV Peak grad:     4.3 mmHg AV Area (VTI):     1.97 cm     PR End Diast Vel: 2.90 msec AV Vmax:           132.25 cm/s AV Vmean:          84.950 cm/s AV VTI:            0.261 m AV Peak Grad:      7.0 mmHg AV Mean Grad:  3.8 mmHg LVOT Vmax:         105.20 cm/s LVOT Vmean:        66.400 cm/s LVOT VTI:          0.182 m LVOT/AV VTI ratio: 0.70  AORTA Ao Root diam: 3.30 cm Ao Asc diam:  2.80 cm MITRAL VALVE               TRICUSPID VALVE MV Area (PHT): 4.99 cm    TR Peak grad:   50.7 mmHg MV Area VTI:   1.91 cm    TR Vmax:        356.00 cm/s MV Peak grad:  4.7 mmHg MV Mean grad:  1.5 mmHg    SHUNTS MV Vmax:       1.09 m/s    Systemic VTI:  0.18 m MV Vmean:      60.8 cm/s   Systemic Diam: 1.90 cm MV Decel Time: 152 msec MV E velocity: 88.80 cm/s MV A velocity: 97.90 cm/s MV E/A ratio:  0.91 Olga Millers MD Electronically signed by Olga Millers MD Signature Date/Time: 04/15/2023/1:03:34 PM    Final    DG Toe 2nd Left Result Date: 04/15/2023 CLINICAL DATA:  Evaluate for osteomyelitis.  Fall. EXAM: LEFT SECOND TOE COMPARISON:  None Available. 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 No acute bony abnormality. Specifically, no fracture, subluxation, or dislocation. No bone destruction to suggest osteomyelitis. Degenerative changes in the midfoot. IMPRESSION: No acute bony abnormality. Electronically Signed   By: Charlett Nose M.D.   On: 04/15/2023 00:51    DG Chest Port 1 View Result Date: 04/15/2023 CLINICAL DATA:  Questionable sepsis - evaluate for abnormality. Fall. EXAM: PORTABLE CHEST 1 VIEW COMPARISON:  05/13/2009 FINDINGS: Left pacer in place with leads in the right atrium and right ventricle. Cardiomegaly, vascular congestion. No confluent opacity, overt edema or effusions. No pneumothorax. No acute bony abnormality. IMPRESSION: Cardiomegaly, vascular congestion. Electronically Signed   By: Charlett Nose M.D.   On: 04/15/2023 00:48   CT Head Wo Contrast Result Date: 04/14/2023 CLINICAL DATA:  Head trauma, minor (Age >= 65y); Neck trauma (Age >= 65y) EXAM: CT HEAD WITHOUT CONTRAST CT CERVICAL SPINE WITHOUT CONTRAST TECHNIQUE: Multidetector CT imaging of the head and cervical spine was performed following the standard protocol without intravenous contrast. Multiplanar CT image reconstructions of the cervical spine were also generated. RADIATION DOSE REDUCTION: This exam was performed according to the departmental dose-optimization program which includes automated exposure control, adjustment of the mA and/or kV according to patient size and/or use of iterative reconstruction technique. COMPARISON:  None Available. FINDINGS: CT HEAD FINDINGS Brain: Patchy and confluent areas of decreased attenuation are noted throughout the deep and periventricular white matter of the cerebral hemispheres bilaterally, compatible with chronic microvascular ischemic disease. No evidence of large-territorial acute infarction. No parenchymal hemorrhage. No mass lesion. No extra-axial collection. No mass effect or midline shift. No hydrocephalus. Basilar cisterns are patent. Empty sella. Vascular: No hyperdense vessel. Atherosclerotic calcifications are present within the cavernous internal carotid arteries. Skull: No acute fracture or focal lesion. Sinuses/Orbits: Right mastoidectomy. Left mastoid effusion. Paranasal sinuses are clear. The orbits are unremarkable. Other:  None. CT CERVICAL SPINE FINDINGS Alignment: Grade 1 anterolisthesis of C3 on C4 and C4 on C5. Skull base and vertebrae: Multilevel severe degenerative changes of the spine. Associated severe right C2-C3, left C3-C4, right C4-C5 osseous neural foraminal stenosis. No acute fracture. No aggressive appearing focal osseous lesion or  focal pathologic process. Soft tissues and spinal canal: No prevertebral fluid or swelling. No visible canal hematoma. Upper chest: Unremarkable. Other: None. IMPRESSION: 1. No acute intracranial abnormality. 2. No acute displaced fracture or traumatic listhesis of the cervical spine. 3. Multilevel severe degenerative changes of the spine. Associated severe right C2-C3, left C3-C4, right C4-C5 osseous neural foraminal stenosis. 4. Empty sella. Findings is often a normal anatomic variant but can be associated with idiopathic intracranial hypertension (pseudotumor cerebri). Electronically Signed   By: Tish Frederickson M.D.   On: 04/14/2023 19:43   CT Cervical Spine Wo Contrast Result Date: 04/14/2023 CLINICAL DATA:  Head trauma, minor (Age >= 65y); Neck trauma (Age >= 65y) EXAM: CT HEAD WITHOUT CONTRAST CT CERVICAL SPINE WITHOUT CONTRAST TECHNIQUE: Multidetector CT imaging of the head and cervical spine was performed following the standard protocol without intravenous contrast. Multiplanar CT image reconstructions of the cervical spine were also generated. RADIATION DOSE REDUCTION: This exam was performed according to the departmental dose-optimization program which includes automated exposure control, adjustment of the mA and/or kV according to patient size and/or use of iterative reconstruction technique. COMPARISON:  None Available. FINDINGS: CT HEAD FINDINGS Brain: Patchy and confluent areas of decreased attenuation are noted throughout the deep and periventricular white matter of the cerebral hemispheres bilaterally, compatible with chronic microvascular ischemic disease. No evidence of  large-territorial acute infarction. No parenchymal hemorrhage. No mass lesion. No extra-axial collection. No mass effect or midline shift. No hydrocephalus. Basilar cisterns are patent. Empty sella. Vascular: No hyperdense vessel. Atherosclerotic calcifications are present within the cavernous internal carotid arteries. Skull: No acute fracture or focal lesion. Sinuses/Orbits: Right mastoidectomy. Left mastoid effusion. Paranasal sinuses are clear. The orbits are unremarkable. Other: None. CT CERVICAL SPINE FINDINGS Alignment: Grade 1 anterolisthesis of C3 on C4 and C4 on C5. Skull base and vertebrae: Multilevel severe degenerative changes of the spine. Associated severe right C2-C3, left C3-C4, right C4-C5 osseous neural foraminal stenosis. No acute fracture. No aggressive appearing focal osseous lesion or focal pathologic process. Soft tissues and spinal canal: No prevertebral fluid or swelling. No visible canal hematoma. Upper chest: Unremarkable. Other: None. IMPRESSION: 1. No acute intracranial abnormality. 2. No acute displaced fracture or traumatic listhesis of the cervical spine. 3. Multilevel severe degenerative changes of the spine. Associated severe right C2-C3, left C3-C4, right C4-C5 osseous neural foraminal stenosis. 4. Empty sella. Findings is often a normal anatomic variant but can be associated with idiopathic intracranial hypertension (pseudotumor cerebri). Electronically Signed   By: Tish Frederickson M.D.   On: 04/14/2023 19:43   DG Ankle Complete Left Result Date: 04/14/2023 CLINICAL DATA:  Knee gave way patient fell. EXAM: LEFT ANKLE COMPLETE - 3+ VIEW COMPARISON:  None Available. FINDINGS: Mild swelling about the left ankle. No acute fracture or dislocation. Degenerative changes about the midfoot. IMPRESSION: Mild swelling about the left ankle without acute fracture. Electronically Signed   By: Minerva Fester M.D.   On: 04/14/2023 19:30   DG Knee Complete 4 Views Right Result Date:  04/14/2023 CLINICAL DATA:  Fall after knee scaphoid EXAM: LEFT KNEE - COMPLETE 4+ VIEW; RIGHT KNEE - COMPLETE 4+ VIEW COMPARISON:  None Available. FINDINGS: No acute fracture or dislocation. 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 There is moderate to advanced medial compartment narrowing on the left and moderate to advanced medial and lateral compartment narrowing on the right. No knee joint effusion. Vascular calcifications. IMPRESSION: 1. No acute fracture or dislocation. 2. 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 Electronically Signed  By: Minerva Fester M.D.   On: 04/14/2023 19:28   DG Knee Complete 4 Views Left Result Date: 04/14/2023 CLINICAL DATA:  Fall after knee scaphoid EXAM: LEFT KNEE - COMPLETE 4+ VIEW; RIGHT KNEE - COMPLETE 4+ VIEW COMPARISON:  None Available. FINDINGS: No acute fracture or dislocation. 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 There is moderate to advanced medial compartment narrowing on the left and moderate to advanced medial and lateral compartment narrowing on the right. No knee joint effusion. Vascular calcifications. IMPRESSION: 1. No acute fracture or dislocation. 2. 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 Electronically Signed   By: Minerva Fester M.D.   On: 04/14/2023 19:28   DG Ankle Complete Right Result Date: 04/14/2023 CLINICAL DATA:  Ankle pain following fall, initial encounter EXAM: RIGHT ANKLE - COMPLETE 3+ VIEW COMPARISON:  None Available. FINDINGS: No acute fracture or dislocation is noted. Flattening of the plantar arch is noted. Tarsal degenerative changes are seen. IMPRESSION: Chronic changes without acute abnormality. Electronically Signed   By: Alcide Clever M.D.   On: 04/14/2023 19:25   DG Hip Unilat With Pelvis 2-3 Views Left Result Date: 04/14/2023 CLINICAL DATA:  Hip pain after fall. EXAM: DG HIP (WITH OR WITHOUT PELVIS) 2-3V RIGHT; DG HIP (WITH OR WITHOUT PELVIS) 2-3V LEFT COMPARISON:   None Available. FINDINGS: Bilateral THA. No radiographic evidence of loosening. No acute fracture or dislocation. Heterotopic ossification about both proximal femurs. Pelvic phleboliths. IMPRESSION: Negative. Electronically Signed   By: Minerva Fester M.D.   On: 04/14/2023 19:25   DG Hip Unilat With Pelvis 2-3 Views Right Result Date: 04/14/2023 CLINICAL DATA:  Hip pain after fall. EXAM: DG HIP (WITH OR WITHOUT PELVIS) 2-3V RIGHT; DG HIP (WITH OR WITHOUT PELVIS) 2-3V LEFT COMPARISON:  None Available. FINDINGS: Bilateral THA. No radiographic evidence of loosening. No acute fracture or dislocation. Heterotopic ossification about both proximal femurs. Pelvic phleboliths. IMPRESSION: Negative. Electronically Signed   By: Minerva Fester M.D.   On: 04/14/2023 19:25    I have personally spent 82 minutes involved in face-to-face and non-face-to-face activities for this patient on the day of the visit. Professional time spent includes the following activities: Preparing to see the patient (review of tests), Obtaining and/or reviewing separately obtained history (admission/discharge record), Performing a medically appropriate examination and/or evaluation , Ordering medications/tests/procedures, referring and communicating with other health care professionals, Documenting clinical information in the EMR, Independently interpreting results (not separately reported), Communicating results to the patient/family/caregiver, Counseling and educating the patient/family/caregiver and Care coordination (not separately reported).  Electronically signed by:   Plan d/w requesting provider as well as ID pharm D  Of note, portions of this note may have been created with voice recognition software. While this note has been edited for accuracy, occasional wrong-word or 'sound-a-like' substitutions may have occurred due to the inherent limitations of voice recognition software.   Odette Fraction, MD Infectious Disease  Physician [facility] Pager: [phone]

## 2023-04-17 LAB — CBC
HCT: 38.3 % — ABNORMAL LOW (ref 39.0–52.0)
Hemoglobin: 13.3 g/dL (ref 13.0–17.0)
MCH: 32 pg (ref 26.0–34.0)
MCHC: 34.7 g/dL (ref 30.0–36.0)
MCV: 92.1 fL (ref 80.0–100.0)
Platelets: 214 10*3/uL (ref 150–400)
RBC: 4.16 MIL/uL — ABNORMAL LOW (ref 4.22–5.81)
RDW: 13.8 % (ref 11.5–15.5)
WBC: 11 10*3/uL — ABNORMAL HIGH (ref 4.0–10.5)
nRBC: 0 % (ref 0.0–0.2)

## 2023-04-17 LAB — GLUCOSE, CAPILLARY
Glucose-Capillary: 202 mg/dL — ABNORMAL HIGH (ref 70–99)
Glucose-Capillary: 213 mg/dL — ABNORMAL HIGH (ref 70–99)
Glucose-Capillary: 241 mg/dL — ABNORMAL HIGH (ref 70–99)
Glucose-Capillary: 250 mg/dL — ABNORMAL HIGH (ref 70–99)
Glucose-Capillary: 262 mg/dL — ABNORMAL HIGH (ref 70–99)
Glucose-Capillary: 348 mg/dL — ABNORMAL HIGH (ref 70–99)

## 2023-04-17 LAB — BASIC METABOLIC PANEL
Anion gap: 10 (ref 5–15)
BUN: 19 mg/dL (ref 8–23)
CO2: 22 mmol/L (ref 22–32)
Calcium: 8.6 mg/dL — ABNORMAL LOW (ref 8.9–10.3)
Chloride: 98 mmol/L (ref 98–111)
Creatinine, Ser: 1.47 mg/dL — ABNORMAL HIGH (ref 0.61–1.24)
GFR, Estimated: 50 mL/min — ABNORMAL LOW (ref >60)
Glucose, Bld: 211 mg/dL — ABNORMAL HIGH (ref 70–99)
Potassium: 4.2 mmol/L (ref 3.5–5.1)
Sodium: 130 mmol/L — ABNORMAL LOW (ref 135–145)

## 2023-04-17 LAB — CK: Total CK: 6501 U/L — ABNORMAL HIGH (ref 49–397)

## 2023-04-17 LAB — CULTURE, BLOOD (ROUTINE X 2): Special Requests: ADEQUATE

## 2023-04-17 LAB — URINE CULTURE: Culture: 70000 — AB

## 2023-04-17 NOTE — Progress Notes (Signed)
° °  [facility] has been requested to perform a transesophageal echocardiogram on Adam Ponce for bacteremia.  After careful review of history and examination, the risks and benefits of transesophageal echocardiogram have been explained including risks of esophageal damage, perforation (1:10,000 risk), bleeding, pharyngeal hematoma as well as other potential complications associated with anesthesia including aspiration, arrhythmia, respiratory failure and death. Alternatives to treatment were discussed, questions were answered. Patient is willing to proceed.   Patient with Pacemaker presented with bacteremia. TTE 12/27 showed EF 50-55%, no RWMA, trivial MR. Need TEE to rule out SBE or pacemaker lead infection. Vital sign stable. No significant anemia or thrombocytopenia  Azalee Course, [place] 04/17/2023 4:16 PM

## 2023-04-17 NOTE — Progress Notes (Signed)
ID brief note   Afebrile in the last 24 hrs      Latest Ref Rng & Units 04/17/2023    6:55 AM 04/16/2023    8:52 AM 04/15/2023    1:07 AM  CBC  WBC 4.0 - 10.5 K/uL 11.0  12.2  15.4   Hemoglobin 13.0 - 17.0 g/dL 16.1  09.6  04.5   Hematocrit 39.0 - 52.0 % 38.3  39.6  40.0   Platelets 150 - 400 K/uL 214  200  224       Latest Ref Rng & Units 04/17/2023    6:55 AM 04/16/2023    8:52 AM 04/15/2023    1:07 AM  CMP  Glucose 70 - 99 mg/dL 409  811  914   BUN 8 - 23 mg/dL 19  19  27    Creatinine 0.61 - 1.24 mg/dL 7.82  9.56  2.13   Sodium 135 - 145 mmol/L 130  131  131   Potassium 3.5 - 5.1 mmol/L 4.2  4.5  4.4   Chloride 98 - 111 mmol/L 98  100  98   CO2 22 - 32 mmol/L 22  23  19    Calcium 8.9 - 10.3 mg/dL 8.6  8.9  9.3    Results for orders placed or performed during the hospital encounter of 04/14/23  Blood Culture (routine x 2)     Status: None (Preliminary result)   Collection Time: 04/15/23  1:07 AM   Specimen: BLOOD RIGHT HAND  Result Value Ref Range Status   Specimen Description BLOOD RIGHT HAND  Final   Special Requests   Final    BOTTLES DRAWN AEROBIC AND ANAEROBIC Blood Culture adequate volume   Culture   Final    NO GROWTH 2 DAYS Performed at [facility], [address], [place], [place] [address]    Report Status PENDING  Incomplete  Blood Culture (routine x 2)     Status: Abnormal   Collection Time: 04/15/23  1:07 AM   Specimen: BLOOD RIGHT HAND  Result Value Ref Range Status   Specimen Description BLOOD RIGHT HAND  Final   Special Requests   Final    BOTTLES DRAWN AEROBIC AND ANAEROBIC Blood Culture adequate volume   Culture  Setup Time   Final    GRAM POSITIVE COCCI ANAEROBIC BOTTLE ONLY CRITICAL RESULT CALLED TO, READ BACK BY AND VERIFIED WITH: PHARMD L. Imogene Burn [id number] @ 2204 FH Performed at [facility], [address], [place], [place] [address]    Culture STAPHYLOCOCCUS AUREUS (A)  Final   Report Status 04/17/2023 FINAL  Final   Organism  ID, Bacteria STAPHYLOCOCCUS AUREUS  Final      Susceptibility   Staphylococcus aureus - MIC*    CIPROFLOXACIN <=0.5 SENSITIVE Sensitive     ERYTHROMYCIN >=8 RESISTANT Resistant     GENTAMICIN <=0.5 SENSITIVE Sensitive     OXACILLIN <=0.25 SENSITIVE Sensitive     TETRACYCLINE <=1 SENSITIVE Sensitive     VANCOMYCIN <=0.5 SENSITIVE Sensitive     TRIMETH/SULFA 160 RESISTANT Resistant     CLINDAMYCIN <=0.25 SENSITIVE Sensitive     RIFAMPIN <=0.5 SENSITIVE Sensitive     Inducible Clindamycin NEGATIVE Sensitive     LINEZOLID 2 SENSITIVE Sensitive     * STAPHYLOCOCCUS AUREUS  Blood Culture ID Panel (Reflexed)     Status: Abnormal   Collection Time: 04/15/23  1:07 AM  Result Value Ref Range Status   Enterococcus faecalis NOT DETECTED NOT DETECTED  Final   Enterococcus Faecium NOT DETECTED NOT DETECTED Final   Listeria monocytogenes NOT DETECTED NOT DETECTED Final   Staphylococcus species DETECTED (A) NOT DETECTED Final    Comment: CRITICAL RESULT CALLED TO, READ BACK BY AND VERIFIED WITH: PHARMD L. CHEN [id number] @ 2204 FH    Staphylococcus aureus (BCID) DETECTED (A) NOT DETECTED Final    Comment: CRITICAL RESULT CALLED TO, READ BACK BY AND VERIFIED WITH: PHARMD L. Imogene Burn [id number] @ 2204 FH    Staphylococcus epidermidis NOT DETECTED NOT DETECTED Final   Staphylococcus lugdunensis NOT DETECTED NOT DETECTED Final   Streptococcus species NOT DETECTED NOT DETECTED Final   Streptococcus agalactiae NOT DETECTED NOT DETECTED Final   Streptococcus pneumoniae NOT DETECTED NOT DETECTED Final   Streptococcus pyogenes NOT DETECTED NOT DETECTED Final   A.calcoaceticus-baumannii NOT DETECTED NOT DETECTED Final   Bacteroides fragilis NOT DETECTED NOT DETECTED Final   Enterobacterales NOT DETECTED NOT DETECTED Final   Enterobacter cloacae complex NOT DETECTED NOT DETECTED Final   Escherichia coli NOT DETECTED NOT DETECTED Final   Klebsiella aerogenes NOT DETECTED NOT DETECTED Final   Klebsiella oxytoca NOT  DETECTED NOT DETECTED Final   Klebsiella pneumoniae NOT DETECTED NOT DETECTED Final   Proteus species NOT DETECTED NOT DETECTED Final   Salmonella species NOT DETECTED NOT DETECTED Final   Serratia marcescens NOT DETECTED NOT DETECTED Final   Haemophilus influenzae NOT DETECTED NOT DETECTED Final   Neisseria meningitidis NOT DETECTED NOT DETECTED Final   Pseudomonas aeruginosa NOT DETECTED NOT DETECTED Final   Stenotrophomonas maltophilia NOT DETECTED NOT DETECTED Final   Candida albicans NOT DETECTED NOT DETECTED Final   Candida auris NOT DETECTED NOT DETECTED Final   Candida glabrata NOT DETECTED NOT DETECTED Final   Candida krusei NOT DETECTED NOT DETECTED Final   Candida parapsilosis NOT DETECTED NOT DETECTED Final   Candida tropicalis NOT DETECTED NOT DETECTED Final   Cryptococcus neoformans/gattii NOT DETECTED NOT DETECTED Final   Meth resistant mecA/C and MREJ NOT DETECTED NOT DETECTED Final    Comment: Performed at [facility], [address], [place], [place] [address]  Urine Culture     Status: Abnormal   Collection Time: 04/15/23  3:49 AM   Specimen: Urine, Clean Catch  Result Value Ref Range Status   Specimen Description URINE, CLEAN CATCH  Final   Special Requests   Final    NONE Performed at [facility], [address] [place], [place] [address]    Culture (A)  Final    70,000 COLONIES/mL KLEBSIELLA PNEUMONIAE 50,000 COLONIES/mL KLEBSIELLA PNEUMONIAE    Report Status 04/17/2023 FINAL  Final   Organism ID, Bacteria KLEBSIELLA PNEUMONIAE (A)  Final   Organism ID, Bacteria KLEBSIELLA PNEUMONIAE (A)  Final      Susceptibility   Klebsiella pneumoniae - MIC*    AMPICILLIN >=32 RESISTANT Resistant     CEFAZOLIN <=4 SENSITIVE Sensitive     CEFEPIME <=0.12 SENSITIVE Sensitive     CEFTRIAXONE <=0.25 SENSITIVE Sensitive     CIPROFLOXACIN <=0.25 SENSITIVE Sensitive     GENTAMICIN <=1 SENSITIVE Sensitive     IMIPENEM <=0.25 SENSITIVE Sensitive      NITROFURANTOIN 64 INTERMEDIATE Intermediate     TRIMETH/SULFA <=20 SENSITIVE Sensitive     AMPICILLIN/SULBACTAM >=32 RESISTANT Resistant     PIP/TAZO 8 SENSITIVE Sensitive ug/mL   Klebsiella pneumoniae - MIC*    AMPICILLIN >=32 RESISTANT Resistant     CEFAZOLIN <=4 SENSITIVE Sensitive     CEFEPIME <=0.12  SENSITIVE Sensitive     CEFTRIAXONE <=0.25 SENSITIVE Sensitive     CIPROFLOXACIN <=0.25 SENSITIVE Sensitive     GENTAMICIN <=1 SENSITIVE Sensitive     IMIPENEM <=0.25 SENSITIVE Sensitive     NITROFURANTOIN <=16 SENSITIVE Sensitive     TRIMETH/SULFA <=20 SENSITIVE Sensitive     AMPICILLIN/SULBACTAM >=32 RESISTANT Resistant     PIP/TAZO 16 SENSITIVE Sensitive ug/mL    * 70,000 COLONIES/mL KLEBSIELLA PNEUMONIAE    50,000 COLONIES/mL KLEBSIELLA PNEUMONIAE   12/27 urine cx with 2 different klebsiella pneumoniae with different sensi's, no GU symptoms and likely contamination, will keep on unasyn   Plan for OR by Podiatry tomorrow  Cards as well EP has been notified for TEE as well as PPM with staph bacteremia  Fu repeat blood cx Monitor CBC and BMP Dr Luciana Axe on starting tomorrow

## 2023-04-17 NOTE — Progress Notes (Signed)
PODIATRY PROGRESS NOTE Patient Name: Adam Ponce  DOB 1950-08-29 DOA 04/14/2023  Hospital Day: 4  Assessment:  72 y.o. 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    AF, vitals improved WBC 11 A1c 8.6 CRP 19.4 ESR 91  Plan:  - NPO p MN for OR tomorrow AM - plan for remaining 2nd toe amputation and possible 2nd met head resection. Pt agrees. As does family I spoke with on phone.  - Continue IV abx broad spectrum pending further culture data,  - Anticoagulation: per primary no need to hold - Wound care: none needed pre op - WB status: WBAT bilateral lower extremity - Will continue to follow        Corinna Gab, DPM [facility]    Subjective:  Discussed with patient plan for OR tomorrow for left 2nd toe amp, possible met head resection.   Objective:   Vitals:   04/16/23 2348 04/17/23 0809  BP: (!) 122/55 133/69  Pulse: 76 78  Resp: 17 20  Temp: 99.1 F (37.3 C) 98.6 F (37 C)  SpO2: 98% 97%       Latest Ref Rng & Units 04/17/2023    6:55 AM 04/16/2023    8:52 AM 04/15/2023    1:07 AM  CBC  WBC 4.0 - 10.5 K/uL 11.0  12.2  15.4   Hemoglobin 13.0 - 17.0 g/dL 16.1  09.6  04.5   Hematocrit 39.0 - 52.0 % 38.3  39.6  40.0   Platelets 150 - 400 K/uL 214  200  224        Latest Ref Rng & Units 04/17/2023    6:55 AM 04/16/2023    8:52 AM 04/15/2023    1:07 AM  BMP  Glucose 70 - 99 mg/dL 409  811  914   BUN 8 - 23 mg/dL 19  19  27    Creatinine 0.61 - 1.24 mg/dL 7.82  9.56  2.13   Sodium 135 - 145 mmol/L 130  131  131   Potassium 3.5 - 5.1 mmol/L 4.2  4.5  4.4   Chloride 98 - 111 mmol/L 98  100  98   CO2 22 - 32 mmol/L 22  23  19    Calcium 8.9 - 10.3 mg/dL 8.6  8.9  9.3     General: AAOx3, NAD  Lower Extremity Exam Lower Extremity  Exam Vasc:     R - PT palpable, DP palpable. Cap refill diminished               L - PT palpable, DP palpable. Cap refill dimiinshed   Derm:    R - Normal temp/texture/turgor with no open lesion or clinical signs of infection                L - Erythema and edema of the left 2nd toe, forefoot         MSK:     R -  No gross deformities. Compartments soft, non-tender, compressible               L - Edema of the left foot   Neuro:R - Gross sensation diminished. Gross motor function intact                 L -  Gross sensation diminished. Gross motor function intact   MRI L foot WO contrast: Osteomyelitis of proximal phalanx 2nd toe. 9 mm abscess dorsal 2nd toe toe.   Radiology:  Results reviewed. See assessment for pertinent imaging results

## 2023-04-17 NOTE — NC FL2 (Signed)
MEDICAID FL2 LEVEL OF CARE FORM     IDENTIFICATION  Patient Name: Adam Ponce Birthdate: [birth year] Sex: male Admission Date (Current Location): 04/14/2023  [facility] and [place] Number:  Producer, television/film/video and Address:  [facility], [address] [place], [place] [address]      Provider Number: [id number]  Attending Physician Name and Address:  Azucena Fallen, MD  Relative Name and Phone Number:  Bonita Quin (sister) [phone]    Current Level of Care: Hospital Recommended Level of Care: Skilled Nursing Facility Prior Approval Number:    Date Approved/Denied:   PASRR Number: [id number]  Discharge Plan: SNF    Current Diagnoses: Patient Active Problem List   Diagnosis Date Noted   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) 04/16/2023   Severe sepsis (HCC) 04/16/2023   Diabetic foot infection (HCC) 04/15/2023   Rhabdomyolysis 04/15/2023   Acute-on-chronic kidney injury (HCC) 04/15/2023   CHF (congestive heart failure) (HCC) 04/15/2023   UTI (urinary tract infection) 04/15/2023   Syncope 04/15/2023   Generalized weakness 04/15/2023   BELL'S PALSY, 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   HICCUPS 04/12/2007   ANEMIA, PERNICIOUS 11/28/2006   HIP REPLACEMENT, RIGHT, HX OF 09/18/2006   Essential hypertension, benign 03/14/2006   SYPHILIS 01/25/2005   VITAMIN B12 DEFICIENCY 07/30/2004   Type 2 diabetes mellitus (HCC) 04/20/2003   HEARING LOSS, RIGHT EAR 04/19/2001   HIV INFECTION 04/19/1993    Orientation RESPIRATION BLADDER Height & Weight     Self, Time, Situation, Place  Normal Continent Weight: 277 lb 12.5 oz (126 kg) Height:  5\' 11"  (180.3 cm)  BEHAVIORAL SYMPTOMS/MOOD NEUROLOGICAL BOWEL NUTRITION STATUS      Continent (WDL) Diet (Please see discharge summary)  AMBULATORY STATUS COMMUNICATION OF NEEDS Skin   Extensive Assist Verbally Other (Comment) (Wound/Incision LDAs,Wound/Incision open or dehisced,toe,anterior,Left,2nd toe with  ulcerated area on top of the partially amputated toe with dime size ulcerated area on food bed)                       Personal Care Assistance Level of Assistance  Bathing, Feeding, Dressing Bathing Assistance: Maximum assistance Feeding assistance: Independent Dressing Assistance: Maximum assistance     Functional Limitations Info  Sight, Hearing, Speech Sight Info: Adequate (WDL) Hearing Info: Adequate Speech Info: Adequate    SPECIAL CARE FACTORS FREQUENCY  PT (By licensed PT), OT (By licensed OT)     PT Frequency: 5x min weekly OT Frequency: 5x min weekly            Contractures Contractures Info: Not present    Additional Factors Info  Code Status, Allergies, Insulin Sliding Scale Code Status Info: DNR Allergies Info: Ciprofloxacin,Ciprofloxacin-dexamethasone,Codeine   Insulin Sliding Scale Info: insulin aspart (novoLOG) injection 0-9 Units every 4 hours,insulin glargine-yfgn (SEMGLEE) injection 10 Units daily at bedtime       Current Medications (04/17/2023):  This is the current hospital active medication list Current Facility-Administered Medications  Medication Dose Route Frequency Provider Last Rate Last Admin   acetaminophen (TYLENOL) tablet 1,000 mg  1,000 mg Oral QID Vann, Jessica U, DO   1,000 mg at 04/16/23 1943   Ampicillin-Sulbactam (UNASYN) 3 g in sodium chloride 0.9 % 100 mL IVPB  3 g Intravenous Q6H Arma Heading, RPH 200 mL/hr at 04/17/23 0830 3 g at 04/17/23 0830   darunavir-cobicistat (PREZCOBIX) 800-150 MG per tablet 1 tablet  1 tablet Oral Q breakfast Marlin Canary U, DO  1 tablet at 04/17/23 0948   emtricitabine-tenofovir AF (DESCOVY) 200-25 MG per tablet 1 tablet  1 tablet Oral Daily Marlin Canary U, DO   1 tablet at 04/17/23 2130   HYDROcodone-acetaminophen (NORCO/VICODIN) 5-325 MG per tablet 1-2 tablet  1-2 tablet Oral Q6H PRN Marlin Canary U, DO   2 tablet at 04/17/23 8657   insulin aspart (novoLOG) injection 0-9 Units  0-9 Units  Subcutaneous Q4H John Giovanni, MD   3 Units at 04/17/23 1154   insulin glargine-yfgn (SEMGLEE) injection 10 Units  10 Units Subcutaneous QHS Marlin Canary U, DO   10 Units at 04/16/23 2256     Discharge Medications: Please see discharge summary for a list of discharge medications.  Relevant Imaging Results:  Relevant Lab Results:   Additional Information [id number]  Delilah Shan, LCSWA

## 2023-04-17 NOTE — Anesthesia Preprocedure Evaluation (Addendum)
Anesthesia Evaluation  Patient identified by MRN, date of birth, ID band Patient awake    Reviewed: Allergy & Precautions, NPO status , Patient's Chart, lab work & pertinent test results  Airway Mallampati: II  TM Distance: >3 FB Neck ROM: Full    Dental no notable dental hx. (+) Edentulous Upper, Missing   Pulmonary asthma , COPD, former smoker   Pulmonary exam normal breath sounds clear to auscultation       Cardiovascular hypertension, Normal cardiovascular exam+ dysrhythmias Atrial Fibrillation + pacemaker  Rhythm:Regular Rate:Normal  04/15/2023 TEE Left ventricular ejection fraction, by estimation, is 50 to 55%. The  left ventricle has low normal function. The left ventricle has no regional  wall motion abnormalities.    Neuro/Psych    GI/Hepatic   Endo/Other  diabetes, Type 2    Renal/GU Renal InsufficiencyRenal disease     Musculoskeletal   Abdominal  (+) + obese (BMI 38.8)  Peds  Hematology  (+) Blood dyscrasia, anemia , HIVLab Results      Component                Value               Date                      WBC                      11.0 (H)            04/17/2023                HGB                      13.3                04/17/2023                HCT                      38.3 (L)            04/17/2023                MCV                      92.1                04/17/2023                PLT                      214                 04/17/2023              Anesthesia Other Findings   Reproductive/Obstetrics                             Anesthesia Physical Anesthesia Plan  ASA: 3  Anesthesia Plan: MAC   Post-op Pain Management: Ofirmev IV (intra-op)* and Minimal or no pain anticipated   Induction:   PONV Risk Score and Plan: Propofol infusion, Treatment may vary due to age or medical condition and Midazolam  Airway Management Planned: Simple Face Mask and Natural  Airway  Additional Equipment: None  Intra-op Plan:   Post-operative Plan:   Informed  Consent: I have reviewed the patients History and Physical, chart, labs and discussed the procedure including the risks, benefits and alternatives for the proposed anesthesia with the patient or authorized representative who has indicated his/her understanding and acceptance.     Dental advisory given  Plan Discussed with: CRNA and Anesthesiologist  Anesthesia Plan Comments: (mac)        Anesthesia Quick Evaluation

## 2023-04-17 NOTE — Progress Notes (Signed)
PROGRESS NOTE    Adam Ponce  ZOX:[id number] DOB: 1950-12-05 DOA: 04/14/2023 PCP: System, Provider Not In   Brief Narrative:  Adam Ponce is a 72 y.o. male with medical history significant of atrial flutter status post PPM on Coumadin, insulin-dependent type 2 diabetes, peripheral neuropathy, history of toe amputations, HIV, hypertension, hyperlipidemia, GERD, asthma/COPD, CKD stage IIIa, OSA, ILD, BPH presented to the ED after he had a fall due to generalized weakness - found down by maintenance at facility who called EMS -reports weakness after using bathroom with syncopal event - unknown downtime.   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 at midnight) -Continue broad-spectrum antibiotics with Unasyn in the interim -follow for sensitivities  Rhabdomyolysis AKI on CKD stage IIIa -Creatinine downtrending appropriately -CK elevated compared to previous, follow trend continue fluids as appropriate   Heart failure, ruled out Volume overload, unspecified -Echo EF 50 to 55% low normal left ventricular function without wall motion abnormalities, without diastolic dysfunction    Rule out UTI -Asymptomatic, culture positive for Klebsiella but less than 100k colony - not clean-catch per report, -continue to  treat above, follow for urinary symptoms  Mild hyponatremia Asymptomatic, not clinically relevant, resolved with p.o. intake   Syncope Likely vasovagal event.   CT head negative for acute intracranial abnormality and no focal neurodeficit on exam.   PE less likely given no tachycardia, chest pain, or hypoxia.    Generalized weakness PT/OT eval, fall precautions.   Atrial flutter Has a pacemaker.   -per patient he was taken off coumadin by his PCP at the [place] -EP consulted at intake to evaluate pacemaker given bacteremia   Insulin-dependent type 2 diabetes; uncontrolled with hyperglycemia -SSI and long acting -continue hypoglycemic protocol -A1c 8.6   Hypertension Currently normotensive -resume meds as necessary   Hyperlipidemia Hold statin at this time in the setting of rhabdomyolysis.   Peripheral neuropathy HIV - resume home Descovy/prezcobix GERD Asthma/COPD: Stable, no signs of acute exacerbation. ILD - stable/asymptomatic BPH - stable asymptomatic  DVT prophylaxis:    Code Status:   Code Status: Do not attempt resuscitation (DNR) PRE-ARREST INTERVENTIONS DESIRED  Family Communication: None present  Status is: Inpatient  Dispo: The patient is from: Home              Anticipated d/c is to: To be determined              Anticipated d/c date is: Pending operative course              Patient currently not medically stable for discharge  Consultants:  ID, podiatry  Procedures:  Planned second toe and possible second metatarsal head removal 04/18/2023  Antimicrobials:  Unasyn  Subjective: No acute issues or events overnight pain currently well-controlled denies nausea vomiting diarrhea constipation any fevers chills chest pain  Objective:  Vitals:   04/16/23 1657 04/16/23 2010 04/16/23 2043 04/16/23 2348  BP: 138/60 (!) 118/57 130/64 (!) 122/55  Pulse: 71 70 70 76  Resp: 18 18 20 17   Temp: 98 F (36.7 C) 99.9 F (37.7 C) 98.5 F (36.9 C) 99.1 F (37.3  C)  TempSrc: Oral Oral Oral Oral  SpO2: 99% 95% 97% 98%  Weight:      Height:        Intake/Output Summary (Last 24 hours) at 04/17/2023 0736 Last data filed at 04/16/2023 1900 Gross per 24 hour  Intake --  Output 750 ml  Net -750 ml   Filed Weights   04/15/23 0354  Weight: 126 kg    Examination:  General:  Pleasantly resting in bed, No acute distress. HEENT:  Normocephalic atraumatic.  Sclerae nonicteric, noninjected.  Extraocular movements intact bilaterally. Neck:  Without mass or deformity.  Trachea is midline. Lungs:  Clear to auscultate bilaterally without rhonchi, wheeze, or rales. Heart:  Regular rate and rhythm.  Without murmurs, rubs, or gallops. Abdomen:  Soft, nontender, nondistended.  Without guarding or rebound. Extremities: Left foot bandage clean dry intact  Data Reviewed: I have personally reviewed following labs and imaging studies  CBC: Recent Labs  Lab 04/14/23 2116 04/15/23 0107 04/16/23 0852 04/17/23 0655  WBC 15.3* 15.4* 12.2* 11.0*  NEUTROABS 11.9*  --   --   --   HGB 14.3 13.7 13.6 13.3  HCT 42.7 40.0 39.6 38.3*  MCV 93.8 93.5 93.8 92.1  PLT 238 224 200 214   Basic Metabolic Panel: Recent Labs  Lab 04/14/23 2116 04/15/23 0107 04/16/23 0852  NA 133* 131* 131*  K 4.8 4.4 4.5  CL 100 98 100  CO2 21* 19* 23  GLUCOSE 247* 266* 232*  BUN 25* 27* 19  CREATININE 2.34* 2.16* 1.86*  CALCIUM 9.8 9.3 8.9   GFR: Estimated Creatinine Clearance: 48.5 mL/min (A) (by C-G formula based on SCr of 1.86 mg/dL (H)). Liver Function Tests: Recent Labs  Lab 04/14/23 2116  AST 39  ALT 22  ALKPHOS 55  BILITOT 0.8  PROT 8.2*  ALBUMIN 3.6   No results for input(s): "LIPASE", "AMYLASE" in the last 168 hours. No results for input(s): "AMMONIA" in the last 168 hours. Coagulation Profile: Recent Labs  Lab 04/15/23 0107  INR 1.2   Cardiac Enzymes: Recent Labs  Lab 04/15/23 0107  CKTOTAL 2,253*   BNP (last 3 results) No results for  input(s): "PROBNP" in the last 8760 hours. HbA1C: Recent Labs    04/15/23 0107  HGBA1C 8.6*   CBG: Recent Labs  Lab 04/16/23 1224 04/16/23 1704 04/16/23 2009 04/16/23 2354 04/17/23 0356  GLUCAP 256* 229* 266* 255* 213*   Lipid Profile: No results for input(s): "CHOL", "HDL", "LDLCALC", "TRIG", "CHOLHDL", "LDLDIRECT" in the last 72 hours. Thyroid Function Tests: No results for input(s): "TSH", "T4TOTAL", "FREET4", "T3FREE", "THYROIDAB" in the last 72 hours. Anemia Panel: No results for input(s): "VITAMINB12", "FOLATE", "FERRITIN", "TIBC", "IRON", "RETICCTPCT" in the last 72 hours. Sepsis Labs: Recent Labs  Lab 04/15/23 0115 04/15/23 0302  LATICACIDVEN 1.3 1.3    Recent Results (from the past 240 hours)  Blood Culture (routine x 2)     Status: None (Preliminary result)   Collection Time: 04/15/23  1:07 AM   Specimen: BLOOD RIGHT HAND  Result Value Ref Range Status   Specimen Description BLOOD RIGHT HAND  Final   Special Requests   Final    BOTTLES DRAWN AEROBIC AND ANAEROBIC Blood Culture  adequate volume   Culture   Final    NO GROWTH 1 DAY Performed at [facility], [address] [place], [place] [address]    Report Status PENDING  Incomplete  Blood Culture (routine x 2)     Status: Abnormal (Preliminary result)   Collection Time: 04/15/23  1:07 AM   Specimen: BLOOD RIGHT HAND  Result Value Ref Range Status   Specimen Description BLOOD RIGHT HAND  Final   Special Requests   Final    BOTTLES DRAWN AEROBIC AND ANAEROBIC Blood Culture adequate volume   Culture  Setup Time   Final    GRAM POSITIVE COCCI ANAEROBIC BOTTLE ONLY CRITICAL RESULT CALLED TO, READ BACK BY AND VERIFIED WITH: PHARMD L. Imogene Burn [id number] @ 2204 FH    Culture (A)  Final    STAPHYLOCOCCUS AUREUS SUSCEPTIBILITIES TO FOLLOW Performed at [facility], [address] [place], [place] [address]    Report Status PENDING  Incomplete  Blood Culture ID Panel (Reflexed)     Status: Abnormal    Collection Time: 04/15/23  1:07 AM  Result Value Ref Range Status   Enterococcus faecalis NOT DETECTED NOT DETECTED Final   Enterococcus Faecium NOT DETECTED NOT DETECTED Final   Listeria monocytogenes NOT DETECTED NOT DETECTED Final   Staphylococcus species DETECTED (A) NOT DETECTED Final    Comment: CRITICAL RESULT CALLED TO, READ BACK BY AND VERIFIED WITH: PHARMD L. CHEN [id number] @ 2204 FH    Staphylococcus aureus (BCID) DETECTED (A) NOT DETECTED Final    Comment: CRITICAL RESULT CALLED TO, READ BACK BY AND VERIFIED WITH: PHARMD L. Imogene Burn [id number] @ 2204 FH    Staphylococcus epidermidis NOT DETECTED NOT DETECTED Final   Staphylococcus lugdunensis NOT DETECTED NOT DETECTED Final   Streptococcus species NOT DETECTED NOT DETECTED Final   Streptococcus agalactiae NOT DETECTED NOT DETECTED Final   Streptococcus pneumoniae NOT DETECTED NOT DETECTED Final   Streptococcus pyogenes NOT DETECTED NOT DETECTED Final   A.calcoaceticus-baumannii NOT DETECTED NOT DETECTED Final   Bacteroides fragilis NOT DETECTED NOT DETECTED Final   Enterobacterales NOT DETECTED NOT DETECTED Final   Enterobacter cloacae complex NOT DETECTED NOT DETECTED Final   Escherichia coli NOT DETECTED NOT DETECTED Final   Klebsiella aerogenes NOT DETECTED NOT DETECTED Final   Klebsiella oxytoca NOT DETECTED NOT DETECTED Final   Klebsiella pneumoniae NOT DETECTED NOT DETECTED Final   Proteus species NOT DETECTED NOT DETECTED Final   Salmonella species NOT DETECTED NOT DETECTED Final   Serratia marcescens NOT DETECTED NOT DETECTED Final   Haemophilus influenzae NOT DETECTED NOT DETECTED Final   Neisseria meningitidis NOT DETECTED NOT DETECTED Final   Pseudomonas aeruginosa NOT DETECTED NOT DETECTED Final   Stenotrophomonas maltophilia NOT DETECTED NOT DETECTED Final   Candida albicans NOT DETECTED NOT DETECTED Final   Candida auris NOT DETECTED NOT DETECTED Final   Candida glabrata NOT DETECTED NOT DETECTED Final   Candida  krusei NOT DETECTED NOT DETECTED Final   Candida parapsilosis NOT DETECTED NOT DETECTED Final   Candida tropicalis NOT DETECTED NOT DETECTED Final   Cryptococcus neoformans/gattii NOT DETECTED NOT DETECTED Final   Meth resistant mecA/C and MREJ NOT DETECTED NOT DETECTED Final    Comment: Performed at [facility], [address], [place], [place] [address]  Urine Culture     Status: Abnormal (Preliminary result)   Collection Time: 04/15/23  3:49 AM   Specimen: Urine, Clean Catch  Result Value Ref Range Status  Specimen Description URINE, CLEAN CATCH  Final   Special Requests NONE  Final   Culture (A)  Final    70,000 COLONIES/mL SUSCEPTIBILITIES TO FOLLOW Performed at [facility], [address], [place], [place] [address]    Report Status PENDING  Incomplete         Radiology Studies: VAS [place] ABI WITH/WO TBI Result Date: 04/16/2023  LOWER EXTREMITY DOPPLER STUDY Patient Name:  Adam Ponce  Date of Exam:   04/16/2023 Medical Rec #: [id number]      Accession #:    [id number] Date of Birth: [birth year]       Patient Gender: M Patient Age:   55 years Exam Location:  [facility] Procedure:      VAS [place] ABI WITH/WO TBI Referring Phys: Marlin Canary --------------------------------------------------------------------------------  Indications: Ulceration. Pain. High Risk Factors: Hypertension, hyperlipidemia, Diabetes.  Comparison Study: No prior exam. Performing Technologist: Fernande Bras  Examination Guidelines: A complete evaluation includes at minimum, Doppler waveform signals and systolic blood pressure reading at the level of bilateral brachial, anterior tibial, and posterior tibial arteries, when vessel segments are accessible. Bilateral testing is considered an integral part of a complete examination. Photoelectric Plethysmograph (PPG) waveforms and toe systolic pressure readings are included as required and additional duplex testing as needed. Limited examinations  for reoccurring indications may be performed as noted.  ABI Findings: +---------+------------------+-----+---------+--------+ Right    Rt Pressure (mmHg)IndexWaveform Comment  +---------+------------------+-----+---------+--------+ Brachial 144                    triphasic         +---------+------------------+-----+---------+--------+ PTA      173               1.20 triphasic         +---------+------------------+-----+---------+--------+ DP       178               1.24 triphasic         +---------+------------------+-----+---------+--------+ [facility]               1.62 Abnormal          +---------+------------------+-----+---------+--------+ +---------+------------------+-----+---------+-------+ Left     Lt Pressure (mmHg)IndexWaveform Comment +---------+------------------+-----+---------+-------+ Brachial 143                    triphasic        +---------+------------------+-----+---------+-------+ PTA      166               1.15 triphasic        +---------+------------------+-----+---------+-------+ DP       178               1.24 triphasic        +---------+------------------+-----+---------+-------+ Great Toe128               0.89 Normal           +---------+------------------+-----+---------+-------+  Summary: Right: Resting right ankle-brachial index is within normal range. The right toe-brachial index is abnormal. Left: Resting left ankle-brachial index is within normal range. The left toe-brachial index is normal. *See table(s) above for measurements and observations.     Preliminary    MR FOOT LEFT WO CONTRAST Result Date: 04/15/2023 CLINICAL DATA:  Foot swelling, diabetes. Left second toe amputation 2 years ago. EXAM: MRI OF THE LEFT FOOT WITHOUT CONTRAST TECHNIQUE: Multiplanar, multisequence MR imaging of the left foot was performed. No intravenous contrast was administered.  COMPARISON:  Radiographs 04/15/2023 FINDINGS:  Bones/Joint/Cartilage Prior amputation of the second toe at the mid proximal phalangeal level. There is abnormal edema in the remaining proximal phalanx of the second toe compatible with osteomyelitis. Substantial degenerative arthropathy in the midfoot and along the Lisfranc joint. Ligaments Lisfranc ligament intact. Muscles and Tendons Muscular atrophy in the forefoot. Soft tissues Along the dorsal lateral margin of the remaining proximal phalanx of the second toe, a 9 by 9 by 8 mm (volume = 300 mm^3) fluid signal intensity collection is present, probably a small abscess. Surrounding inflammatory findings compatible with cellulitis. Mild intermetatarsal bursitis between the heads of the first and second, and second and third metatarsals. Dorsal subcutaneous edema in the forefoot, cellulitis not excluded. IMPRESSION: 1. 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 2. 9 mm in diameter abscess along the dorsal-lateral margin of the remaining proximal phalanx second toe with surrounding cellulitis. 3. Dorsal subcutaneous edema in the forefoot, cellulitis not excluded. 4. Mild intermetatarsal bursitis between the heads of the first and second, and second and third metatarsals. 5. Substantial degenerative arthropathy in the midfoot and along the Lisfranc joint. 6. Muscular atrophy in the forefoot. Electronically Signed   By: Gaylyn Rong M.D.   On: 04/15/2023 16:11   ECHOCARDIOGRAM COMPLETE Result Date: 04/15/2023    ECHOCARDIOGRAM REPORT   Patient Name:   Adam Ponce Date of Exam: 04/15/2023 Medical Rec #:  [id number]     Height:       71.0 in Accession #:    [id number]    Weight:       277.8 lb Date of Birth:  02/20/1951      BSA:          2.424 m Patient Age:    72 years      BP:           126/81 mmHg Patient Gender: M             HR:           64 bpm. Exam Location:  Inpatient Procedure: 2D Echo, Cardiac Doppler, Color Doppler and Intracardiac            Opacification Agent Indications:     Syncope  History:        Patient has no prior history of Echocardiogram examinations.                 CHF; Risk Factors:Diabetes and Hypertension.  Sonographer:    Vern Claude Referring Phys: [id number] QVZDGLOVF RATHORE  Sonographer Comments: Image acquisition challenging due to patient body habitus. IMPRESSIONS  1. Left ventricular ejection fraction, by estimation, is 50 to 55%. The left ventricle has low normal function. The left ventricle has no regional wall motion abnormalities. Left ventricular diastolic parameters were normal.  2. Right ventricular systolic function is normal. The right ventricular size is normal. There is moderately elevated pulmonary artery systolic pressure.  3. The mitral valve is normal in structure. Trivial mitral valve regurgitation. No evidence of mitral stenosis.  4. The aortic valve is tricuspid. Aortic valve regurgitation is not visualized. No aortic stenosis is present. Comparison(s): No prior Echocardiogram. FINDINGS  Left Ventricle: Left ventricular ejection fraction, by estimation, is 50 to 55%. The left ventricle has low normal function. The left ventricle has no regional wall motion abnormalities. Definity contrast agent was given IV to delineate the left ventricular endocardial borders. The left ventricular internal cavity size was normal in size. There is no left ventricular hypertrophy.  Left ventricular diastolic parameters were normal. Right Ventricle: The right ventricular size is normal. Right ventricular systolic function is normal. There is moderately elevated pulmonary artery systolic pressure. The tricuspid regurgitant velocity is 3.56 m/s, and with an assumed right atrial pressure of 3 mmHg, the estimated right ventricular systolic pressure is 53.7 mmHg. Left Atrium: Left atrial size was normal in size. Right Atrium: Right atrial size was normal in size. Pericardium: There is no evidence of pericardial effusion. Mitral Valve: The mitral valve is normal in structure.  Trivial mitral valve regurgitation. No evidence of mitral valve stenosis. MV peak gradient, 4.7 mmHg. The mean mitral valve gradient is 1.5 mmHg. Tricuspid Valve: The tricuspid valve is normal in structure. Tricuspid valve regurgitation is mild . No evidence of tricuspid stenosis. Aortic Valve: The aortic valve is tricuspid. Aortic valve regurgitation is not visualized. No aortic stenosis is present. Aortic valve mean gradient measures 3.8 mmHg. Aortic valve peak gradient measures 7.0 mmHg. Aortic valve area, by VTI measures 1.97 cm. Pulmonic Valve: The pulmonic valve was normal in structure. Pulmonic valve regurgitation is mild. No evidence of pulmonic stenosis. Aorta: The aortic root is normal in size and structure. Venous: The inferior vena cava was not well visualized. IAS/Shunts: The interatrial septum was not well visualized.  LEFT VENTRICLE PLAX 2D LVIDd:         5.20 cm      Diastology LVIDs:         3.50 cm      LV e' medial:    8.27 cm/s LV PW:         0.90 cm      LV E/e' medial:  10.7 LV IVS:        0.90 cm      LV e' lateral:   10.23 cm/s LVOT diam:     1.90 cm      LV E/e' lateral: 8.7 LV SV:         52 LV SV Index:   21 LVOT Area:     2.84 cm  LV Volumes (MOD) LV vol d, MOD A2C: 185.0 ml LV vol d, MOD A4C: 200.0 ml LV vol s, MOD A2C: 76.6 ml LV vol s, MOD A4C: 85.4 ml LV SV MOD A2C:     108.4 ml LV SV MOD A4C:     200.0 ml LV SV MOD BP:      107.3 ml RIGHT VENTRICLE RV Basal diam:  4.00 cm RV Mid diam:    3.10 cm RV S prime:     13.50 cm/s TAPSE (M-mode): 2.4 cm LEFT ATRIUM           Index        RIGHT ATRIUM           Index LA diam:      3.80 cm 1.57 cm/m   RA Area:     19.80 cm LA Vol (A2C): 71.9 ml 29.66 ml/m  RA Volume:   50.80 ml  20.95 ml/m LA Vol (A4C): 45.4 ml 18.73 ml/m  AORTIC VALVE                    PULMONIC VALVE AV Area (Vmax):    2.26 cm     PV Vmax:          1.04 m/s AV Area (Vmean):   2.22 cm     PV Peak grad:     4.3 mmHg AV Area (VTI):     1.97 cm  PR End Diast Vel:  2.90 msec AV Vmax:           132.25 cm/s AV Vmean:          84.950 cm/s AV VTI:            0.261 m AV Peak Grad:      7.0 mmHg AV Mean Grad:      3.8 mmHg LVOT Vmax:         105.20 cm/s LVOT Vmean:        66.400 cm/s LVOT VTI:          0.182 m LVOT/AV VTI ratio: 0.70  AORTA Ao Root diam: 3.30 cm Ao Asc diam:  2.80 cm MITRAL VALVE               TRICUSPID VALVE MV Area (PHT): 4.99 cm    TR Peak grad:   50.7 mmHg MV Area VTI:   1.91 cm    TR Vmax:        356.00 cm/s MV Peak grad:  4.7 mmHg MV Mean grad:  1.5 mmHg    SHUNTS MV Vmax:       1.09 m/s    Systemic VTI:  0.18 m MV Vmean:      60.8 cm/s   Systemic Diam: 1.90 cm MV Decel Time: 152 msec MV E velocity: 88.80 cm/s MV A velocity: 97.90 cm/s MV E/A ratio:  0.91 Olga Millers MD Electronically signed by Olga Millers MD Signature Date/Time: 04/15/2023/1:03:34 PM    Final    Scheduled Meds:  acetaminophen  1,000 mg Oral QID   darunavir-cobicistat  1 tablet Oral Q breakfast   emtricitabine-tenofovir AF  1 tablet Oral Daily   insulin aspart  0-9 Units Subcutaneous Q4H   insulin glargine-yfgn  10 Units Subcutaneous QHS   Continuous Infusions:  ampicillin-sulbactam (UNASYN) IV 3 g (04/17/23 0425)     LOS: 2 days   Time spent:  Azucena Fallen, DO [facility]  If 7PM-7AM, please contact night-coverage [email or web address]  04/17/2023, 7:36 AM

## 2023-04-17 NOTE — TOC Initial Note (Addendum)
Transition of Care [facility]) - Initial/Assessment Note    Patient Details  Name: Adam Ponce MRN: [id number] Date of Birth: [birth year]  [facility]) CM/SW Contact:    Delilah Shan, LCSWA Phone Number: 04/17/2023, 12:22 PM  Clinical Narrative:                  CSW received consult for possible SNF placement at time of discharge. CSW spoke with patient regarding PT recommendation of SNF placement at time of discharge. Patient reports PTA he comes from home alone.Patient expressed understanding of PT recommendation and is agreeable to SNF placement at time of discharge. Patient gave CSW permission to fax out for possible SNF placement.CSW discussed insurance authorization process and will provide Medicare SNF ratings list with accepted SNF bed offers when available. Patient agreeable to use his [place] medicare benefits for SNF placement.No further questions reported at this time. CSW to continue to follow and assist with discharge planning needs.   Expected Discharge Plan: Skilled Nursing Facility Barriers to Discharge: Continued Medical Work up   Patient Goals and CMS Choice Patient states their goals for this hospitalization and ongoing recovery are:: SNF   Choice offered to / list presented to : Patient      Expected Discharge Plan and Services In-house Referral: Clinical Social Work     Living arrangements for the past 2 months: Apartment                                      Prior Living Arrangements/Services Living arrangements for the past 2 months: Apartment Lives with:: Self Patient language and need for interpreter reviewed:: Yes Do you feel safe going back to the place where you live?: No   SNF  Need for Family Participation in Patient Care: Yes (Comment) Care giver support system in place?: Yes (comment)   Criminal Activity/Legal Involvement Pertinent to Current Situation/Hospitalization: No - Comment as needed  Activities of Daily Living    ADL Screening (condition at time of admission) Independently performs ADLs?: No Does the patient have a NEW difficulty with bathing/dressing/toileting/self-feeding that is expected to last >3 days?: Yes (Initiates electronic notice to provider for possible OT consult) (needs help) Does the patient have a NEW difficulty with getting in/out of bed, walking, or climbing stairs that is expected to last >3 days?: Yes (Initiates electronic notice to provider for possible PT consult) (needs help) Does the patient have a NEW difficulty with communication that is expected to last >3 days?: No Is the patient deaf or have difficulty hearing?: Yes Does the patient have difficulty seeing, even when wearing glasses/contacts?: No Does the patient have difficulty concentrating, remembering, or making decisions?: Yes  Permission Sought/Granted Permission sought to share information with : Case Manager, Family Supports, Oceanographer granted to share information with : Yes, Verbal Permission Granted  Share Information with NAME: Bonita Quin  Permission granted to share info w AGENCY: SNF  Permission granted to share info w Relationship: sister  Permission granted to share info w Contact Information: Bonita Quin [phone]  Emotional Assessment   Attitude/Demeanor/Rapport: Gracious Affect (typically observed): Calm Orientation: : Oriented to Self, Oriented to Place, Oriented to  Time, Oriented to Situation Alcohol / Substance Use: Not Applicable Psych Involvement: No (comment)  Admission diagnosis:  AKI (acute kidney injury) (HCC) [N17.9] Diabetic foot infection (HCC) [J62.831, L08.9] Severe sepsis (HCC) [A41.9, R65.20] Traumatic rhabdomyolysis, initial encounter (HCC) [  T79.6XXA] Patient Active Problem List   Diagnosis Date Noted   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) 04/16/2023   Severe sepsis (HCC) 04/16/2023   Diabetic foot infection (HCC) 04/15/2023   Rhabdomyolysis  04/15/2023   Acute-on-chronic kidney injury (HCC) 04/15/2023   CHF (congestive heart failure) (HCC) 04/15/2023   UTI (urinary tract infection) 04/15/2023   Syncope 04/15/2023   Generalized weakness 04/15/2023   BELL'S PALSY, 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   HICCUPS 04/12/2007   ANEMIA, PERNICIOUS 11/28/2006   HIP REPLACEMENT, RIGHT, 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/2006   Essential hypertension, benign 03/14/2006   SYPHILIS 01/25/2005   VITAMIN B12 DEFICIENCY 07/30/2004   Type 2 diabetes mellitus (HCC) 04/20/2003   HEARING LOSS, RIGHT EAR 04/19/2001   HIV INFECTION 04/19/1993   PCP:  System, Provider Not In Pharmacy:   [id number] - [place], [place] - [address] AT [facility] [address] [place] [address] Phone: [phone] Fax: [phone]     Social Drivers of Health (SDOH) Social History: SDOH Screenings   Food Insecurity: Food Insecurity Present (04/15/2023)  Housing: High Risk (04/15/2023)  Transportation Needs: Unmet Transportation Needs (04/15/2023)  Utilities: At Risk (04/15/2023)  Depression (PHQ2-9): Low Risk  (03/20/2020)  Recent Concern: Depression (PHQ2-9) - Medium Risk (01/10/2020)  Tobacco Use: Medium Risk (04/14/2023)   SDOH Interventions:     Readmission Risk Interventions     No data to display

## 2023-04-18 ENCOUNTER — Inpatient Hospital Stay (HOSPITAL_COMMUNITY): Payer: No Typology Code available for payment source

## 2023-04-18 ENCOUNTER — Inpatient Hospital Stay (HOSPITAL_COMMUNITY): Payer: No Typology Code available for payment source | Admitting: Anesthesiology

## 2023-04-18 ENCOUNTER — Other Ambulatory Visit: Payer: Self-pay

## 2023-04-18 ENCOUNTER — Encounter (HOSPITAL_COMMUNITY): Payer: Self-pay | Admitting: Internal Medicine

## 2023-04-18 ENCOUNTER — Encounter (HOSPITAL_COMMUNITY)
Admission: EM | Disposition: A | Discharge status: Skilled Nursing Facility | Payer: Self-pay | Source: Skilled Nursing Facility | Attending: Internal Medicine

## 2023-04-18 DIAGNOSIS — J449 Chronic obstructive pulmonary disease, unspecified: Secondary | ICD-10-CM

## 2023-04-18 DIAGNOSIS — E1169 Type 2 diabetes mellitus with other specified complication: Secondary | ICD-10-CM

## 2023-04-18 DIAGNOSIS — I4891 Unspecified atrial fibrillation: Secondary | ICD-10-CM | POA: Diagnosis not present

## 2023-04-18 DIAGNOSIS — M86172 Other acute osteomyelitis, left ankle and foot: Secondary | ICD-10-CM

## 2023-04-18 DIAGNOSIS — M869 Osteomyelitis, unspecified: Secondary | ICD-10-CM | POA: Diagnosis not present

## 2023-04-18 DIAGNOSIS — R7881 Bacteremia: Secondary | ICD-10-CM

## 2023-04-18 DIAGNOSIS — B9561 Methicillin susceptible Staphylococcus aureus infection as the cause of diseases classified elsewhere: Secondary | ICD-10-CM | POA: Diagnosis not present

## 2023-04-18 DIAGNOSIS — L089 Local infection of the skin and subcutaneous tissue, unspecified: Secondary | ICD-10-CM | POA: Diagnosis not present

## 2023-04-18 DIAGNOSIS — E11628 Type 2 diabetes mellitus with other skin complications: Secondary | ICD-10-CM | POA: Diagnosis not present

## 2023-04-18 HISTORY — PX: AMPUTATION: SHX166

## 2023-04-18 LAB — GLUCOSE, CAPILLARY
Glucose-Capillary: 206 mg/dL — ABNORMAL HIGH (ref 70–99)
Glucose-Capillary: 237 mg/dL — ABNORMAL HIGH (ref 70–99)
Glucose-Capillary: 248 mg/dL — ABNORMAL HIGH (ref 70–99)
Glucose-Capillary: 262 mg/dL — ABNORMAL HIGH (ref 70–99)
Glucose-Capillary: 268 mg/dL — ABNORMAL HIGH (ref 70–99)
Glucose-Capillary: 283 mg/dL — ABNORMAL HIGH (ref 70–99)
Glucose-Capillary: 292 mg/dL — ABNORMAL HIGH (ref 70–99)

## 2023-04-18 LAB — CBC
HCT: 38.5 % — ABNORMAL LOW (ref 39.0–52.0)
Hemoglobin: 13.3 g/dL (ref 13.0–17.0)
MCH: 31.6 pg (ref 26.0–34.0)
MCHC: 34.5 g/dL (ref 30.0–36.0)
MCV: 91.4 fL (ref 80.0–100.0)
Platelets: 227 10*3/uL (ref 150–400)
RBC: 4.21 MIL/uL — ABNORMAL LOW (ref 4.22–5.81)
RDW: 13.6 % (ref 11.5–15.5)
WBC: 10.3 10*3/uL (ref 4.0–10.5)
nRBC: 0 % (ref 0.0–0.2)

## 2023-04-18 LAB — HIV-1 RNA QUANT-NO REFLEX-BLD
HIV 1 RNA Quant: <20 {copies}/mL
LOG10 HIV-1 RNA: UNDETERMINED {Log}

## 2023-04-18 LAB — BASIC METABOLIC PANEL
Anion gap: 12 (ref 5–15)
BUN: 18 mg/dL (ref 8–23)
CO2: 23 mmol/L (ref 22–32)
Calcium: 8.5 mg/dL — ABNORMAL LOW (ref 8.9–10.3)
Chloride: 96 mmol/L — ABNORMAL LOW (ref 98–111)
Creatinine, Ser: 1.45 mg/dL — ABNORMAL HIGH (ref 0.61–1.24)
GFR, Estimated: 51 mL/min — ABNORMAL LOW (ref >60)
Glucose, Bld: 223 mg/dL — ABNORMAL HIGH (ref 70–99)
Potassium: 4.2 mmol/L (ref 3.5–5.1)
Sodium: 131 mmol/L — ABNORMAL LOW (ref 135–145)

## 2023-04-18 LAB — SURGICAL PCR SCREEN
MRSA, PCR: NEGATIVE
Staphylococcus aureus: POSITIVE — AB

## 2023-04-18 LAB — CK: Total CK: 3852 U/L — ABNORMAL HIGH (ref 49–397)

## 2023-04-18 SURGERY — AMPUTATION, FOOT, RAY
Anesthesia: Monitor Anesthesia Care | Site: Foot | Laterality: Left

## 2023-04-18 MED ORDER — PROPOFOL 10 MG/ML IV BOLUS
INTRAVENOUS | Status: DC | PRN
  Administered 2023-04-18 (×2): 20 mg via INTRAVENOUS

## 2023-04-18 MED ORDER — BUPIVACAINE HCL (PF) 0.5 % IJ SOLN
INTRAMUSCULAR | Status: AC
  Filled 2023-04-18: qty 30

## 2023-04-18 MED ORDER — FENTANYL CITRATE (PF) 250 MCG/5ML IJ SOLN
INTRAMUSCULAR | Status: AC
  Filled 2023-04-18: qty 5

## 2023-04-18 MED ORDER — INSULIN ASPART 100 UNIT/ML IJ SOLN
INTRAMUSCULAR | Status: AC
  Filled 2023-04-18: qty 1

## 2023-04-18 MED ORDER — ACETAMINOPHEN 10 MG/ML IV SOLN
INTRAVENOUS | Status: AC
  Filled 2023-04-18: qty 100

## 2023-04-18 MED ORDER — LIDOCAINE HCL (PF) 1 % IJ SOLN
INTRAMUSCULAR | Status: DC | PRN
  Administered 2023-04-18: 10 mL

## 2023-04-18 MED ORDER — CHLORHEXIDINE GLUCONATE 0.12 % MT SOLN: 15.0000 mL | Freq: Once | OROMUCOSAL | Status: AC

## 2023-04-18 MED ORDER — SODIUM CHLORIDE 0.9 % IV SOLN: INTRAVENOUS | Status: DC | PRN

## 2023-04-18 MED ORDER — ACETAMINOPHEN 10 MG/ML IV SOLN: 1000.0000 mg | Freq: Once | INTRAVENOUS | Status: DC | PRN

## 2023-04-18 MED ORDER — 0.9 % SODIUM CHLORIDE (POUR BTL) OPTIME
TOPICAL | Status: DC | PRN
  Administered 2023-04-18: 1000 mL

## 2023-04-18 MED ORDER — PANTOPRAZOLE SODIUM 40 MG PO TBEC
40.0000 mg | DELAYED_RELEASE_TABLET | Freq: Every day | ORAL | Status: DC
  Administered 2023-04-18 – 2023-04-22 (×5): 40 mg via ORAL
  Filled 2023-04-18 (×6): qty 1

## 2023-04-18 MED ORDER — ACETAMINOPHEN 10 MG/ML IV SOLN
INTRAVENOUS | Status: DC | PRN
  Administered 2023-04-18: 500 mg via INTRAVENOUS

## 2023-04-18 MED ORDER — SODIUM CHLORIDE 0.9 % IR SOLN
Status: DC | PRN
  Administered 2023-04-18: 1000 mL

## 2023-04-18 MED ORDER — PROPOFOL 500 MG/50ML IV EMUL
INTRAVENOUS | Status: DC | PRN
  Administered 2023-04-18: 100 ug/kg/min via INTRAVENOUS

## 2023-04-18 MED ORDER — LIDOCAINE HCL (PF) 1 % IJ SOLN
INTRAMUSCULAR | Status: AC
  Filled 2023-04-18: qty 30

## 2023-04-18 MED ORDER — CEFAZOLIN SODIUM-DEXTROSE 2-4 GM/100ML-% IV SOLN
2.0000 g | Freq: Three times a day (TID) | INTRAVENOUS | Status: DC
  Administered 2023-04-18 – 2023-04-23 (×16): 2 g via INTRAVENOUS
  Filled 2023-04-18 (×16): qty 100

## 2023-04-18 MED ORDER — ALBUTEROL SULFATE (2.5 MG/3ML) 0.083% IN NEBU
2.5000 mg | INHALATION_SOLUTION | Freq: Four times a day (QID) | RESPIRATORY_TRACT | Status: DC | PRN
  Administered 2023-04-18: 2.5 mg via RESPIRATORY_TRACT
  Filled 2023-04-18 (×2): qty 3

## 2023-04-18 MED ORDER — LIDOCAINE 2% (20 MG/ML) 5 ML SYRINGE
INTRAMUSCULAR | Status: DC | PRN
  Administered 2023-04-18: 60 mg via INTRAVENOUS

## 2023-04-18 MED ORDER — ONDANSETRON HCL 4 MG/2ML IJ SOLN
INTRAMUSCULAR | Status: DC | PRN
  Administered 2023-04-18: 4 mg via INTRAVENOUS

## 2023-04-18 MED ORDER — ORAL CARE MOUTH RINSE: 15.0000 mL | Freq: Once | OROMUCOSAL | Status: AC

## 2023-04-18 MED ORDER — LIDOCAINE 2% (20 MG/ML) 5 ML SYRINGE
INTRAMUSCULAR | Status: AC
  Filled 2023-04-18: qty 5

## 2023-04-18 MED ORDER — AZELASTINE HCL 0.1 % NA SOLN
2.0000 | Freq: Two times a day (BID) | NASAL | Status: DC
Start: 2023-04-18 — End: 2023-04-23
  Administered 2023-04-18 – 2023-04-23 (×10): 2 via NASAL
  Filled 2023-04-18 (×2): qty 30

## 2023-04-18 MED ORDER — ONDANSETRON HCL 4 MG/2ML IJ SOLN
INTRAMUSCULAR | Status: AC
  Filled 2023-04-18: qty 2

## 2023-04-18 MED ORDER — CHLORHEXIDINE GLUCONATE 0.12 % MT SOLN
OROMUCOSAL | Status: AC
  Administered 2023-04-18: 15 mL via OROMUCOSAL
  Filled 2023-04-18: qty 15

## 2023-04-18 MED ORDER — PROPOFOL 10 MG/ML IV BOLUS
INTRAVENOUS | Status: AC
  Filled 2023-04-18: qty 20

## 2023-04-18 MED ORDER — ONDANSETRON HCL 4 MG/2ML IJ SOLN: 4.0000 mg | Freq: Once | INTRAMUSCULAR | Status: DC | PRN

## 2023-04-18 MED ORDER — FENTANYL CITRATE (PF) 100 MCG/2ML IJ SOLN: 25.0000 ug | INTRAMUSCULAR | Status: DC | PRN

## 2023-04-18 MED ORDER — INSULIN ASPART 100 UNIT/ML IJ SOLN
0.0000 [IU] | INTRAMUSCULAR | Status: DC | PRN
  Administered 2023-04-18: 3 [IU] via SUBCUTANEOUS

## 2023-04-18 SURGICAL SUPPLY — 38 items
BLADE OSC/SAGITTAL MD 5.5X18 (BLADE) ×1 IMPLANT
BLADE SURG 10 STRL SS (BLADE) ×1 IMPLANT
BLADE SURG 15 STRL LF DISP TIS (BLADE) ×2 IMPLANT
BNDG COHESIVE 3X5 TAN ST LF (GAUZE/BANDAGES/DRESSINGS) ×1 IMPLANT
BNDG ELASTIC 3INX 5YD STR LF (GAUZE/BANDAGES/DRESSINGS) ×1 IMPLANT
BNDG ELASTIC 4INX 5YD STR LF (GAUZE/BANDAGES/DRESSINGS) IMPLANT
BNDG ESMARK 4X9 LF (GAUZE/BANDAGES/DRESSINGS) ×1 IMPLANT
BNDG GAUZE DERMACEA FLUFF 4 (GAUZE/BANDAGES/DRESSINGS) IMPLANT
CHLORAPREP W/TINT 26 (MISCELLANEOUS) ×1 IMPLANT
ELECT REM PT RETURN 9FT ADLT (ELECTROSURGICAL) ×1 IMPLANT
ELECTRODE REM PT RTRN 9FT ADLT (ELECTROSURGICAL) ×1 IMPLANT
GAUZE SPONGE 2X2 STRL 8-PLY (GAUZE/BANDAGES/DRESSINGS) ×2 IMPLANT
GAUZE SPONGE 4X4 12PLY STRL (GAUZE/BANDAGES/DRESSINGS) ×1 IMPLANT
GAUZE STRETCH 2X75IN STRL (MISCELLANEOUS) ×1 IMPLANT
GAUZE XEROFORM 1X8 LF (GAUZE/BANDAGES/DRESSINGS) ×1 IMPLANT
GLOVE BIO SURGEON STRL SZ7.5 (GLOVE) ×1 IMPLANT
GLOVE BIOGEL PI IND STRL 7.5 (GLOVE) ×1 IMPLANT
GOWN STRL REUS W/ TWL LRG LVL3 (GOWN DISPOSABLE) ×2 IMPLANT
HANDLE YANKAUER SUCT OPEN TIP (MISCELLANEOUS) IMPLANT
KIT BASIN OR (CUSTOM PROCEDURE TRAY) ×1 IMPLANT
NDL BIOPSY JAMSHIDI 8X6 (NEEDLE) IMPLANT
NDL HYPO 25X1 1.5 SAFETY (NEEDLE) ×2 IMPLANT
NEEDLE BIOPSY JAMSHIDI 8X6 (NEEDLE) IMPLANT
NEEDLE HYPO 25X1 1.5 SAFETY (NEEDLE) ×2 IMPLANT
NS IRRIG 1000ML POUR BTL (IV SOLUTION) IMPLANT
PACK ORTHO EXTREMITY (CUSTOM PROCEDURE TRAY) ×1 IMPLANT
PADDING CAST ABS COTTON 4X4 ST (CAST SUPPLIES) ×2 IMPLANT
SET HNDPC FAN SPRY TIP SCT (DISPOSABLE) IMPLANT
SPIKE FLUID TRANSFER (MISCELLANEOUS) ×2 IMPLANT
STAPLER VISISTAT 35W (STAPLE) IMPLANT
STOCKINETTE 4X48 STRL (DRAPES) ×1 IMPLANT
SUT ETHILON 3 0 FSLX (SUTURE) ×1 IMPLANT
SUT PROLENE 3 0 PS 2 (SUTURE) IMPLANT
SUT PROLENE 4 0 PS 2 18 (SUTURE) ×2 IMPLANT
SYR CONTROL 10ML LL (SYRINGE) ×2 IMPLANT
TUBE CONNECTING 12X1/4 (SUCTIONS) IMPLANT
UNDERPAD 30X36 HEAVY ABSORB (UNDERPADS AND DIAPERS) ×1 IMPLANT
WATER STERILE IRR 1000ML POUR (IV SOLUTION) ×1 IMPLANT

## 2023-04-18 NOTE — Anesthesia Preprocedure Evaluation (Addendum)
 Anesthesia Evaluation  Patient identified by MRN, date of birth, ID band Patient awake    Reviewed: Allergy & Precautions, NPO status , Patient's Chart, lab work & pertinent test results  History of Anesthesia Complications Negative for: history of anesthetic complications  Airway Mallampati: II  TM Distance: >3 FB Neck ROM: Full    Dental  (+) Edentulous Upper,    Pulmonary asthma , former smoker   Pulmonary exam normal        Cardiovascular hypertension, +CHF  Normal cardiovascular exam+ pacemaker   TTE 04/15/23: EF 50-55%, moderate pHTN     Neuro/Psych negative neurological ROS     GI/Hepatic Neg liver ROS,,,  Endo/Other  diabetes, Type 2, Oral Hypoglycemic Agents, Insulin  Dependent    Renal/GU Renal InsufficiencyRenal disease  negative genitourinary   Musculoskeletal  (+) Arthritis ,    Abdominal   Peds  Hematology  (+) HIV  Anesthesia Other Findings bacteremia  Reproductive/Obstetrics negative OB ROS                              Anesthesia Physical Anesthesia Plan  ASA: 3  Anesthesia Plan: MAC   Post-op Pain Management: Minimal or no pain anticipated   Induction:   PONV Risk Score and Plan: 1 and Propofol  infusion and Treatment may vary due to age or medical condition  Airway Management Planned: Natural Airway and Nasal Cannula  Additional Equipment: None  Intra-op Plan:   Post-operative Plan:   Informed Consent: I have reviewed the patients History and Physical, chart, labs and discussed the procedure including the risks, benefits and alternatives for the proposed anesthesia with the patient or authorized representative who has indicated his/her understanding and acceptance.   Patient has DNR.  Discussed DNR with patient and Suspend DNR.     Plan Discussed with: CRNA  Anesthesia Plan Comments:         Anesthesia Quick Evaluation

## 2023-04-18 NOTE — Progress Notes (Signed)
[facility]  Date of Admission:  04/14/2023      Total days of antibiotics 3   Cefazolin 12/30 >> c         ASSESSMENT: Adam Ponce is a 72 y.o. male admitted with:   MSSA Bacteremia -  PPM in Situ -  BCx ( + ) 12/26, ( - ) preliminary 12/28 TEE scheduled 12/31 to evaluate lead.  Fever early this morning, suspect source control and will improve with infected toe gone. Leukocytosis resolved.  -change unasyn to cefazolin  -FU TEE   DU Left Foot -  Osteomyelitis -  Now s/p amputation of 2nd ray with podiatry team.  Will switch back to cefazolin only for coverage.  -Follow cultures & pathology report.  -Cefazolin for antibiotic tx  HIV -  Continues on Prezcobix and Descovy together once daily with food  Managed at [place] - Last CD4 was 1026 in Dec with VL < 20 copies  -nothing further outside of continuation of regimen    PLAN: Cefazolin 2 gm IV TID  Follow up TEE EP recs to follow FU pathology and cultures from surgery 12/30  Principal Problem:   Diabetic foot infection (HCC) Active Problems:   Type 2 diabetes mellitus (HCC)   Essential hypertension, benign   Rhabdomyolysis   Acute-on-chronic kidney injury (HCC)   CHF (congestive heart failure) (HCC)   UTI (urinary tract infection)   Syncope   Generalized weakness   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)   Severe sepsis (HCC)    acetaminophen  1,000 mg Oral QID   azelastine  2 spray Each Nare BID   darunavir-cobicistat  1 tablet Oral Q breakfast   emtricitabine-tenofovir AF  1 tablet Oral Daily   insulin aspart       insulin aspart  0-9 Units Subcutaneous Q4H   insulin glargine-yfgn  10 Units Subcutaneous QHS    SUBJECTIVE: Feeling OK. Nothing new to add.  Had second ray amputation today on left foot.   Review of Systems: Review of Systems  Constitutional:  Negative for chills and fever.  Respiratory:  Negative for cough.   Cardiovascular:  Negative for  chest pain.  Gastrointestinal:  Negative for abdominal pain, diarrhea, nausea and vomiting.  Musculoskeletal:  Positive for joint pain (foot pain from surgery).    Allergies  Allergen Reactions   Ciprofloxacin Other (See Comments)    unknown   Ciprofloxacin-Dexamethasone Other (See Comments)    unknown   Codeine     OBJECTIVE: Vitals:   04/18/23 0845 04/18/23 0846 04/18/23 0903 04/18/23 1123  BP: 98/76  131/69 131/72  Pulse: 90 79 80 75  Resp: 17 (!) 25 18 16   Temp:  98.8 F (37.1 C) 98.3 F (36.8 C) (!) 97.5 F (36.4 C)  TempSrc:   Oral Oral  SpO2: 92% 94% 92% 95%  Weight:      Height:       Body mass index is 38.74 kg/m.  Physical Exam Vitals reviewed.  Cardiovascular:     Rate and Rhythm: Normal rate.  Pulmonary:     Effort: Pulmonary effort is normal.     Comments: No shortness of breath detected in conversation.  Neurological:     Mental Status: He is oriented to person, place, and time.  Psychiatric:        Mood and Affect: Mood normal.        Behavior: Behavior normal.  Thought Content: Thought content normal.        Judgment: Judgment normal.     Lab Results Lab Results  Component Value Date   WBC 10.3 04/18/2023   HGB 13.3 04/18/2023   HCT 38.5 (L) 04/18/2023   MCV 91.4 04/18/2023   PLT 227 04/18/2023    Lab Results  Component Value Date   CREATININE 1.45 (H) 04/18/2023   BUN 18 04/18/2023   NA 131 (L) 04/18/2023   K 4.2 04/18/2023   CL 96 (L) 04/18/2023   CO2 23 04/18/2023    Lab Results  Component Value Date   ALT 22 04/14/2023   AST 39 04/14/2023   ALKPHOS 55 04/14/2023   BILITOT 0.8 04/14/2023     Microbiology: Recent Results (from the past 240 hours)  Blood Culture (routine x 2)     Status: None (Preliminary result)   Collection Time: 04/15/23  1:07 AM   Specimen: BLOOD RIGHT HAND  Result Value Ref Range Status   Specimen Description BLOOD RIGHT HAND  Final   Special Requests   Final    BOTTLES DRAWN AEROBIC AND  ANAEROBIC Blood Culture adequate volume   Culture   Final    NO GROWTH 2 DAYS Performed at [facility], [address], [place], [place] [address]    Report Status PENDING  Incomplete  Blood Culture (routine x 2)     Status: Abnormal   Collection Time: 04/15/23  1:07 AM   Specimen: BLOOD RIGHT HAND  Result Value Ref Range Status   Specimen Description BLOOD RIGHT HAND  Final   Special Requests   Final    BOTTLES DRAWN AEROBIC AND ANAEROBIC Blood Culture adequate volume   Culture  Setup Time   Final    GRAM POSITIVE COCCI ANAEROBIC BOTTLE ONLY CRITICAL RESULT CALLED TO, READ BACK BY AND VERIFIED WITH: PHARMD L. Imogene Burn [id number] @ 2204 FH Performed at [facility], [address], [place], [place] [address]    Culture STAPHYLOCOCCUS AUREUS (A)  Final   Report Status 04/17/2023 FINAL  Final   Organism ID, Bacteria STAPHYLOCOCCUS AUREUS  Final      Susceptibility   Staphylococcus aureus - MIC*    CIPROFLOXACIN <=0.5 SENSITIVE Sensitive     ERYTHROMYCIN >=8 RESISTANT Resistant     GENTAMICIN <=0.5 SENSITIVE Sensitive     OXACILLIN <=0.25 SENSITIVE Sensitive     TETRACYCLINE <=1 SENSITIVE Sensitive     VANCOMYCIN <=0.5 SENSITIVE Sensitive     TRIMETH/SULFA 160 RESISTANT Resistant     CLINDAMYCIN <=0.25 SENSITIVE Sensitive     RIFAMPIN <=0.5 SENSITIVE Sensitive     Inducible Clindamycin NEGATIVE Sensitive     LINEZOLID 2 SENSITIVE Sensitive     * STAPHYLOCOCCUS AUREUS  Blood Culture ID Panel (Reflexed)     Status: Abnormal   Collection Time: 04/15/23  1:07 AM  Result Value Ref Range Status   Enterococcus faecalis NOT DETECTED NOT DETECTED Final   Enterococcus Faecium NOT DETECTED NOT DETECTED Final   Listeria monocytogenes NOT DETECTED NOT DETECTED Final   Staphylococcus species DETECTED (A) NOT DETECTED Final    Comment: CRITICAL RESULT CALLED TO, READ BACK BY AND VERIFIED WITH: PHARMD L. CHEN [id number] @ 2204 FH    Staphylococcus aureus (BCID) DETECTED (A) NOT DETECTED  Final    Comment: CRITICAL RESULT CALLED TO, READ BACK BY AND VERIFIED WITH: PHARMD L. CHEN [id number] @ 2204 FH    Staphylococcus epidermidis NOT DETECTED NOT DETECTED Final  Staphylococcus lugdunensis NOT DETECTED NOT DETECTED Final   Streptococcus species NOT DETECTED NOT DETECTED Final   Streptococcus agalactiae NOT DETECTED NOT DETECTED Final   Streptococcus pneumoniae NOT DETECTED NOT DETECTED Final   Streptococcus pyogenes NOT DETECTED NOT DETECTED Final   A.calcoaceticus-baumannii NOT DETECTED NOT DETECTED Final   Bacteroides fragilis NOT DETECTED NOT DETECTED Final   Enterobacterales NOT DETECTED NOT DETECTED Final   Enterobacter cloacae complex NOT DETECTED NOT DETECTED Final   Escherichia coli NOT DETECTED NOT DETECTED Final   Klebsiella aerogenes NOT DETECTED NOT DETECTED Final   Klebsiella oxytoca NOT DETECTED NOT DETECTED Final   Klebsiella pneumoniae NOT DETECTED NOT DETECTED Final   Proteus species NOT DETECTED NOT DETECTED Final   Salmonella species NOT DETECTED NOT DETECTED Final   Serratia marcescens NOT DETECTED NOT DETECTED Final   Haemophilus influenzae NOT DETECTED NOT DETECTED Final   Neisseria meningitidis NOT DETECTED NOT DETECTED Final   Pseudomonas aeruginosa NOT DETECTED NOT DETECTED Final   Stenotrophomonas maltophilia NOT DETECTED NOT DETECTED Final   Candida albicans NOT DETECTED NOT DETECTED Final   Candida auris NOT DETECTED NOT DETECTED Final   Candida glabrata NOT DETECTED NOT DETECTED Final   Candida krusei NOT DETECTED NOT DETECTED Final   Candida parapsilosis NOT DETECTED NOT DETECTED Final   Candida tropicalis NOT DETECTED NOT DETECTED Final   Cryptococcus neoformans/gattii NOT DETECTED NOT DETECTED Final   Meth resistant mecA/C and MREJ NOT DETECTED NOT DETECTED Final    Comment: Performed at [facility], [address], [place], [place] [address]  Urine Culture     Status: Abnormal   Collection Time: 04/15/23  3:49 AM   Specimen:  Urine, Clean Catch  Result Value Ref Range Status   Specimen Description URINE, CLEAN CATCH  Final   Special Requests   Final    NONE Performed at [facility], [address], [place], [place] [address]    Culture (A)  Final    70,000 COLONIES/mL KLEBSIELLA PNEUMONIAE 50,000 COLONIES/mL KLEBSIELLA PNEUMONIAE    Report Status 04/17/2023 FINAL  Final   Organism ID, Bacteria KLEBSIELLA PNEUMONIAE (A)  Final   Organism ID, Bacteria KLEBSIELLA PNEUMONIAE (A)  Final      Susceptibility   Klebsiella pneumoniae - MIC*    AMPICILLIN >=32 RESISTANT Resistant     CEFAZOLIN <=4 SENSITIVE Sensitive     CEFEPIME <=0.12 SENSITIVE Sensitive     CEFTRIAXONE <=0.25 SENSITIVE Sensitive     CIPROFLOXACIN <=0.25 SENSITIVE Sensitive     GENTAMICIN <=1 SENSITIVE Sensitive     IMIPENEM <=0.25 SENSITIVE Sensitive     NITROFURANTOIN 64 INTERMEDIATE Intermediate     TRIMETH/SULFA <=20 SENSITIVE Sensitive     AMPICILLIN/SULBACTAM >=32 RESISTANT Resistant     PIP/TAZO 8 SENSITIVE Sensitive ug/mL   Klebsiella pneumoniae - MIC*    AMPICILLIN >=32 RESISTANT Resistant     CEFAZOLIN <=4 SENSITIVE Sensitive     CEFEPIME <=0.12 SENSITIVE Sensitive     CEFTRIAXONE <=0.25 SENSITIVE Sensitive     CIPROFLOXACIN <=0.25 SENSITIVE Sensitive     GENTAMICIN <=1 SENSITIVE Sensitive     IMIPENEM <=0.25 SENSITIVE Sensitive     NITROFURANTOIN <=16 SENSITIVE Sensitive     TRIMETH/SULFA <=20 SENSITIVE Sensitive     AMPICILLIN/SULBACTAM >=32 RESISTANT Resistant     PIP/TAZO 16 SENSITIVE Sensitive ug/mL    * 70,000 COLONIES/mL KLEBSIELLA PNEUMONIAE    50,000 COLONIES/mL KLEBSIELLA PNEUMONIAE  Surgical pcr screen     Status: Abnormal  Collection Time: 04/18/23  4:53 AM   Specimen: Nasal Mucosa; Nasal Swab  Result Value Ref Range Status   MRSA, PCR NEGATIVE NEGATIVE Final   Staphylococcus aureus POSITIVE (A) NEGATIVE Final    Comment: (NOTE) The Xpert SA Assay (FDA approved for NASAL specimens in patients 22 years  of age and older), is one component of a comprehensive surveillance program. It is not intended to diagnose infection nor to guide or monitor treatment. Performed at [facility], [address] [place], [place] [address]      Rexene Alberts, MSN, NP-C [facility]  [email or web address] Pager: [phone] Office: [phone] [facility] Main Line: [phone] *Secure Chat Communication Welcome

## 2023-04-18 NOTE — Progress Notes (Signed)
TEE scheduled tomorrow, NPO after midnight. Order had been placed on 04/17/23.

## 2023-04-18 NOTE — Anesthesia Postprocedure Evaluation (Signed)
Anesthesia Post Note  Patient: Adam Ponce  Procedure(s) Performed: LEFT SECOND AMPUTATION RAY (Left: Foot)     Patient location during evaluation: PACU Anesthesia Type: MAC Level of consciousness: awake and alert Pain management: pain level controlled Vital Signs Assessment: post-procedure vital signs reviewed and stable Respiratory status: spontaneous breathing, nonlabored ventilation, respiratory function stable and patient connected to nasal cannula oxygen Cardiovascular status: stable and blood pressure returned to baseline Postop Assessment: no apparent nausea or vomiting Anesthetic complications: no   No notable events documented.  Last Vitals:  Vitals:   04/18/23 0846 04/18/23 0903  BP:  131/69  Pulse: 79 80  Resp: (!) 25 18  Temp: 37.1 C 36.8 C  SpO2: 94% 92%    Last Pain:  Vitals:   04/18/23 0903  TempSrc: Oral  PainSc:                  [name]

## 2023-04-18 NOTE — Transfer of Care (Signed)
Immediate Anesthesia Transfer of Care Note  Patient: Adam Ponce  Procedure(s) Performed: LEFT SECOND AMPUTATION RAY (Left: Foot)  Patient Location: PACU  Anesthesia Type:MAC  Level of Consciousness: awake and alert   Airway & Oxygen Therapy: Patient Spontanous Breathing  Post-op Assessment: Report given to RN and Post -op Vital signs reviewed and stable  Post vital signs: Reviewed and stable  Last Vitals:  Vitals Value Taken Time  BP 116/63 04/18/23 0812  Temp    Pulse 76 04/18/23 0815  Resp 14 04/18/23 0815  SpO2 95 % 04/18/23 0815  Vitals shown include unfiled device data.  Last Pain:  Vitals:   04/18/23 0657  TempSrc: Oral  PainSc:       Patients Stated Pain Goal: 0 (04/17/23 0809)  Complications: No notable events documented.

## 2023-04-18 NOTE — Inpatient Diabetes Management (Signed)
Inpatient Diabetes Program Recommendations  AACE/ADA: New Consensus Statement on Inpatient Glycemic Control   Target Ranges:  Prepandial:   less than 140 mg/dL      Peak postprandial:   less than 180 mg/dL (1-2 hours)      Critically ill patients:  140 - 180 mg/dL    Latest Reference Range & Units 04/17/23 08:03 04/17/23 11:23 04/17/23 15:17 04/17/23 20:17 04/17/23 23:40 04/18/23 03:57 04/18/23 07:25 04/18/23 08:14  Glucose-Capillary 70 - 99 mg/dL 253 (H) 664 (H) 403 (H) 262 (H) 241 (H) 206 (H) 248 (H) 237 (H)   Review of Glycemic Control  Diabetes history: DM2 Outpatient Diabetes medications: Lantus 16 units daily, Metformin 500 mg BID, Glipizide 10 mg daily, Ozempic 2 mg Qweek Current orders for Inpatient glycemic control: Semglee 10 units at bedtime, Novolog 0-9 units Q4H  Inpatient Diabetes Program Recommendations:    Insulin: Please consider increasing Semglee to 15 units at bedtime. Once diet resumed, please consider ordering Novolog 3 units TID with meals for meal coverage if patient eats at least 50% of meals.  Thanks, Orlando Penner, RN, MSN, CDCES Diabetes Coordinator Inpatient Diabetes Program [phone] (Team Pager from 8am to 5pm)

## 2023-04-18 NOTE — Progress Notes (Signed)
Orthopedic Tech Progress Note Patient Details:  Adam Ponce 04/01/1951 [id number]  Ortho Devices Type of Ortho Device: Postop shoe/boot Ortho Device/Splint Location: LLE Ortho Device/Splint Interventions: Ordered, Application, Adjustment, Removal   Post Interventions Patient Tolerated: Well Instructions Provided: Care of device  Donald Pore 04/18/2023, 10:02 AM

## 2023-04-18 NOTE — Progress Notes (Signed)
OT Cancellation Note  Patient Details Name: Adam Ponce MRN: [id number] DOB: 1950-11-08   Cancelled Treatment:    Reason Eval/Treat Not Completed: Patient at procedure or test/ unavailable (OR)  [name] 04/18/2023, 8:59 AM

## 2023-04-18 NOTE — Progress Notes (Addendum)
History and Physical Interval Note:  04/18/2023 7:13 AM  Adam Ponce  has presented today for surgery, with the diagnosis of Left 2nd toe osteomyelitis and abscess.  The various methods of treatment have been discussed with the patient and family. After consideration of risks, benefits and other options for treatment, the patient has consented to   Procedure(s): LEFT 2nd toe amputation vs  SECOND AMPUTATION RAY (Left) as a surgical intervention.  The patient's history has been reviewed, patient examined, no change in status, stable for surgery.  I have reviewed the patient's chart and labs.  Questions were answered to the patient's satisfaction.     Jenelle Mages Reneshia Zuccaro

## 2023-04-18 NOTE — Progress Notes (Signed)
PROGRESS NOTE  Adam Ponce YNW:[id number] DOB: January 14, 1951 DOA: 04/14/2023 PCP: System, Provider Not In   LOS: 3 days   Brief Narrative / Interim history: Adam Ponce is a 71 y.o. male with medical history significant of atrial flutter status post PPM on Coumadin, insulin-dependent type 2 diabetes, peripheral neuropathy, history of toe amputations, HIV, hypertension, hyperlipidemia, GERD, asthma/COPD, CKD stage IIIa, OSA, ILD, BPH presented to the ED after he had a fall due to generalized weakness - found down by maintenance at facility who called EMS -reports weakness after using bathroom with syncopal event - unknown downtime.   Subjective / 24h Interval events: Seen postoperatively, doing well.  Denies any significant pain, no nausea or vomiting.  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  ID and podiatry were consulted, and informally cardiology since he has a pacemaker -He is status post amputation of the remaining second toe on 04/08/2023.  Intraoperative cultures pending -Antibiotics per ID, on Unasyn -TEE scheduled tomorrow and will evaluate pacemaker leads as well  Active problems Rhabdomyolysis -due to being down, CK improving  AKI on CKD stage IIIa -Creatinine downtrending appropriately with supportive care, most recent creatinine ratio was 1.3 in 2020.  Creatinine was as high as 2.34 on admission, improved and close to baseline at 1.4 for the past couple of days   Heart failure, ruled out -Echo  EF 50 to 55% low normal left ventricular function without wall motion abnormalities, without diastolic dysfunction    Mild hyponatremia - Asymptomatic, not clinically relevant, resolved with p.o. intake   Syncope - Likely vasovagal event. CT head negative for acute intracranial abnormality and no focal neurodeficit on exam. PE less likely given no tachycardia, chest pain, or hypoxia.    Generalized weakness - PT/OT eval, fall precautions.   Atrial flutter - Has a pacemaker.  per patient he was taken off coumadin by his PCP at the [place].  TEE tomorrow  Iddm, uncontrolled with hyperglycemia -SSI and long acting -continue hypoglycemic protocol. A1c 8.6  Lab Results  Component Value Date   HGBA1C 8.6 (H) 04/15/2023   CBG (last 3)  Recent Labs    04/18/23 0357 04/18/23 0725 04/18/23 0814  GLUCAP 206* 248* 237*     Hypertension -resume meds as necessary, normotensive now   Hyperlipidemia - Hold statin at this time in the setting of rhabdomyolysis.   HIV - resume home Descovy/prezcobix  Asthma/COPD - Stable, no signs of acute exacerbation.  ILD - stable/asymptomatic  BPH - stable asymptomatic  Scheduled Meds:  acetaminophen  1,000 mg Oral QID   darunavir-cobicistat  1 tablet Oral Q breakfast   emtricitabine-tenofovir AF  1 tablet Oral Daily   insulin aspart       insulin aspart  0-9 Units Subcutaneous Q4H   insulin glargine-yfgn  10 Units Subcutaneous QHS   Continuous Infusions:  ampicillin-sulbactam (UNASYN) IV 3 g (04/18/23 0939)   PRN Meds:.HYDROcodone-acetaminophen, insulin aspart  Current Outpatient Medications  Medication Instructions   albuterol (PROVENTIL HFA;VENTOLIN HFA) 108 (90  Base) MCG/ACT inhaler Every 6 hours PRN   Azelastine HCl 137 MCG/SPRAY SOLN 2 sprays, As needed   bacitracin 500 UNIT/GM ointment 1 Application, 2 times daily   benzonatate (TESSALON) 100 mg, Every 8 hours PRN   betamethasone valerate (VALISONE) 0.1 % cream 2 times daily    Cholecalciferol (VITAMIN D3) 10000 units TABS Take by mouth.   cyanocobalamin (VITAMIN B12) 1,000 mcg,  Once   darunavir (PREZISTA) 800 mg   darunavir-cobicistat (PREZCOBIX) 800-150 MG tablet Oral   docusate sodium (COLACE) 100 mg, 2 times daily   emtricitabine-tenofovir AF (DESCOVY) 200-25 MG tablet 1 tablet, Oral, Daily   ezetimibe (ZETIA) 10 mg, Daily   famotidine (PEPCID) 20 mg, 2 times daily   fenofibrate (TRICOR) 145 mg, Daily   glipiZIDE (GLUCOTROL) 10 mg, Daily before breakfast   guaiFENesin (MUCINEX) 600 mg, Every 12 hours PRN   hydrocerin (EUCERIN) CREA 1 Application, As needed   INSULIN GLARGINE [place] 16 Units, Daily at bedtime   ipratropium (ATROVENT) 0.06 % nasal spray 2 sprays, 3 times daily PRN   metFORMIN (GLUCOPHAGE) 500 mg, 2 times daily with meals   pravastatin (PRAVACHOL) 80 mg, Daily   pregabalin (LYRICA) 100 mg, 2 times daily   ritonavir (NORVIR) 100 MG TABS tablet Take by mouth.   Semaglutide (2 MG/DOSE) 2 mg, Subcutaneous, Weekly, Inject on Thursday   sennosides-docusate sodium (SENOKOT-S) 8.6-50 MG tablet 1 tablet, Daily   sodium chloride (OCEAN) 0.65 % nasal spray 1 spray, 4 times daily PRN   terazosin (HYTRIN) 2 MG capsule 1 capsule, Oral, Daily at bedtime   traMADol (ULTRAM) 50 MG tablet Every 6 hours PRN   triamcinolone cream (KENALOG) 0.1 % 1 Application, Daily PRN   valACYclovir (VALTREX) 1,000 mg, 2 times daily    Diet Orders (From admission, onward)     Start     Ordered   04/19/23 0001  Diet NPO time specified Except for: Sips with Meds  Diet effective midnight       Question:  Except for  Answer:  Sips with Meds   04/18/23 0852   04/18/23 0821  Diet Carb Modified Fluid consistency: Thin  Diet effective now       Question Answer Comment  Calorie Level Medium 1600-2000   Fluid consistency: Thin      04/18/23 0820            DVT prophylaxis:    Lab Results  Component Value Date   PLT 227 04/18/2023      Code Status: Do not attempt  resuscitation (DNR) PRE-ARREST INTERVENTIONS DESIRED  Family Communication: No family at bedside   Status is: Inpatient Remains inpatient appropriate because: severity of illness  Level of care: Progressive  Consultants:  Podiatry ID  Objective: Vitals:   04/18/23 0830 04/18/23 0845 04/18/23 0846 04/18/23 0903  BP: 128/72 98/76  131/69  Pulse: 83 90 79 80  Resp: 19 17 (!) 25 18  Temp:   98.8 F (37.1 C) 98.3 F (36.8 C)  TempSrc:    Oral  SpO2: 92% 92% 94% 92%  Weight:      Height:        Intake/Output Summary (Last 24 hours) at 04/18/2023 1016 Last data filed at 04/18/2023 0759 Gross per 24 hour  Intake 1237 ml  Output 1155 ml  Net 82 ml   Wt Readings from Last 3 Encounters:  04/18/23 126 kg  10/28/21 126.1 kg  03/18/20 127.3 kg    Examination:  Constitutional: NAD Eyes:  no scleral icterus ENMT: Mucous membranes are moist.  Neck: normal, supple Respiratory: clear to auscultation bilaterally, no wheezing, no crackles. Normal respiratory effort.  Cardiovascular: Regular rate and rhythm, no murmurs / rubs / gallops. No LE edema.  Abdomen: non distended, no tenderness. Bowel sounds positive.  Musculoskeletal: no clubbing / cyanosis.    Data Reviewed: I have independently reviewed following labs and imaging studies   CBC Recent Labs  Lab 04/14/23 2116 04/15/23 0107 04/16/23 0852 04/17/23 0655 04/18/23 0431  WBC 15.3* 15.4* 12.2* 11.0* 10.3  HGB 14.3 13.7 13.6 13.3 13.3  HCT 42.7 40.0 39.6 38.3* 38.5*  PLT 238 224 200 214 227  MCV 93.8 93.5 93.8 92.1 91.4  MCH 31.4 32.0 32.2 32.0 31.6  MCHC 33.5 34.3 34.3 34.7 34.5  RDW 14.3 14.4 13.9 13.8 13.6  LYMPHSABS 1.9  --   --   --   --   MONOABS 1.4*  --   --   --   --   EOSABS 0.0  --   --   --   --   BASOSABS 0.0  --   --   --   --     Recent Labs  Lab 04/14/23 2116 04/15/23 0107 04/15/23 0115 04/15/23 0302 04/16/23 0852 04/17/23 0655 04/18/23 0431  NA 133* 131*  --   --  131* 130* 131*  K  4.8 4.4  --   --  4.5 4.2 4.2  CL 100 98  --   --  100 98 96*  CO2 21* 19*  --   --  23 22 23   GLUCOSE 247* 266*  --   --  232* 211* 223*  BUN 25* 27*  --   --  19 19 18   CREATININE 2.34* 2.16*  --   --  1.86* 1.47* 1.45*  CALCIUM 9.8 9.3  --   --  8.9 8.6* 8.5*  AST 39  --   --   --   --   --   --   ALT 22  --   --   --   --   --   --   ALKPHOS 55  --   --   --   --   --   --   BILITOT 0.8  --   --   --   --   --   --   ALBUMIN 3.6  --   --   --   --   --   --   CRP  --  18.4*  --   --   --   --   --   LATICACIDVEN  --   --  1.3 1.3  --   --   --   INR  --  1.2  --   --   --   --   --   HGBA1C  --  8.6*  --   --   --   --   --   BNP  --  275.4*  --   --   --   --   --     ------------------------------------------------------------------------------------------------------------------ No results for input(s): "CHOL", "HDL", "LDLCALC", "TRIG", "CHOLHDL", "LDLDIRECT" in the last 72 hours.  Lab Results  Component Value Date   HGBA1C 8.6 (H) 04/15/2023   ------------------------------------------------------------------------------------------------------------------ No results for input(s): "TSH", "T4TOTAL", "T3FREE", "THYROIDAB" in the last 72 hours.  Invalid input(s): "FREET3"  Cardiac Enzymes No results for input(s): "CKMB", "TROPONINI", "MYOGLOBIN" in the last  168 hours.  Invalid input(s): "CK" ------------------------------------------------------------------------------------------------------------------    Component Value Date/Time   BNP 275.4 (H) 04/15/2023 0107    CBG: Recent Labs  Lab 04/17/23 2017 04/17/23 2340 04/18/23 0357 04/18/23 0725 04/18/23 0814  GLUCAP 262* 241* 206* 248* 237*    Recent Results (from the past 240 hours)  Blood Culture (routine x 2)     Status: None (Preliminary result)   Collection Time: 04/15/23  1:07 AM   Specimen: BLOOD RIGHT HAND  Result Value Ref Range Status   Specimen Description BLOOD RIGHT HAND  Final   Special  Requests   Final    BOTTLES DRAWN AEROBIC AND ANAEROBIC Blood Culture adequate volume   Culture   Final    NO GROWTH 2 DAYS Performed at [facility], [address], [place], [place] [address]    Report Status PENDING  Incomplete  Blood Culture (routine x 2)     Status: Abnormal   Collection Time: 04/15/23  1:07 AM   Specimen: BLOOD RIGHT HAND  Result Value Ref Range Status   Specimen Description BLOOD RIGHT HAND  Final   Special Requests   Final    BOTTLES DRAWN AEROBIC AND ANAEROBIC Blood Culture adequate volume   Culture  Setup Time   Final    GRAM POSITIVE COCCI ANAEROBIC BOTTLE ONLY CRITICAL RESULT CALLED TO, READ BACK BY AND VERIFIED WITH: PHARMD L. Imogene Burn [id number] @ 2204 FH Performed at [facility], [address], [place], [place] [address]    Culture STAPHYLOCOCCUS AUREUS (A)  Final   Report Status 04/17/2023 FINAL  Final   Organism ID, Bacteria STAPHYLOCOCCUS AUREUS  Final      Susceptibility   Staphylococcus aureus - MIC*    CIPROFLOXACIN <=0.5 SENSITIVE Sensitive     ERYTHROMYCIN >=8 RESISTANT Resistant     GENTAMICIN <=0.5 SENSITIVE Sensitive     OXACILLIN <=0.25 SENSITIVE Sensitive     TETRACYCLINE <=1 SENSITIVE Sensitive     VANCOMYCIN <=0.5 SENSITIVE Sensitive     TRIMETH/SULFA 160 RESISTANT Resistant     CLINDAMYCIN <=0.25 SENSITIVE Sensitive     RIFAMPIN <=0.5 SENSITIVE Sensitive     Inducible Clindamycin NEGATIVE Sensitive     LINEZOLID 2 SENSITIVE Sensitive     * STAPHYLOCOCCUS AUREUS  Blood Culture ID Panel (Reflexed)     Status: Abnormal   Collection Time: 04/15/23  1:07 AM  Result Value Ref Range Status   Enterococcus faecalis NOT DETECTED NOT DETECTED Final   Enterococcus Faecium NOT DETECTED NOT DETECTED Final   Listeria monocytogenes NOT DETECTED NOT DETECTED Final   Staphylococcus species DETECTED (A) NOT DETECTED Final    Comment: CRITICAL RESULT CALLED TO, READ BACK BY AND VERIFIED WITH: PHARMD L. CHEN [id number] @ 2204 FH     Staphylococcus aureus (BCID) DETECTED (A) NOT DETECTED Final    Comment: CRITICAL RESULT CALLED TO, READ BACK BY AND VERIFIED WITH: PHARMD L. Imogene Burn [id number] @ 2204 FH    Staphylococcus epidermidis NOT DETECTED NOT DETECTED Final   Staphylococcus lugdunensis NOT DETECTED NOT DETECTED Final   Streptococcus species NOT DETECTED NOT DETECTED Final   Streptococcus agalactiae NOT DETECTED NOT DETECTED Final   Streptococcus pneumoniae NOT DETECTED NOT DETECTED Final   Streptococcus pyogenes NOT DETECTED NOT DETECTED Final   A.calcoaceticus-baumannii NOT DETECTED NOT DETECTED Final   Bacteroides fragilis NOT DETECTED NOT DETECTED Final   Enterobacterales NOT DETECTED NOT DETECTED Final   Enterobacter cloacae complex NOT DETECTED NOT DETECTED Final  Escherichia coli NOT DETECTED NOT DETECTED Final   Klebsiella aerogenes NOT DETECTED NOT DETECTED Final   Klebsiella oxytoca NOT DETECTED NOT DETECTED Final   Klebsiella pneumoniae NOT DETECTED NOT DETECTED Final   Proteus species NOT DETECTED NOT DETECTED Final   Salmonella species NOT DETECTED NOT DETECTED Final   Serratia marcescens NOT DETECTED NOT DETECTED Final   Haemophilus influenzae NOT DETECTED NOT DETECTED Final   Neisseria meningitidis NOT DETECTED NOT DETECTED Final   Pseudomonas aeruginosa NOT DETECTED NOT DETECTED Final   Stenotrophomonas maltophilia NOT DETECTED NOT DETECTED Final   Candida albicans NOT DETECTED NOT DETECTED Final   Candida auris NOT DETECTED NOT DETECTED Final   Candida glabrata NOT DETECTED NOT DETECTED Final   Candida krusei NOT DETECTED NOT DETECTED Final   Candida parapsilosis NOT DETECTED NOT DETECTED Final   Candida tropicalis NOT DETECTED NOT DETECTED Final   Cryptococcus neoformans/gattii NOT DETECTED NOT DETECTED Final   Meth resistant mecA/C and MREJ NOT DETECTED NOT DETECTED Final    Comment: Performed at [facility], [address] [place], [place] [address]  Urine Culture     Status: Abnormal    Collection Time: 04/15/23  3:49 AM   Specimen: Urine, Clean Catch  Result Value Ref Range Status   Specimen Description URINE, CLEAN CATCH  Final   Special Requests   Final    NONE Performed at [facility], [address], [place], [place] [address]    Culture (A)  Final    70,000 COLONIES/mL KLEBSIELLA PNEUMONIAE 50,000 COLONIES/mL KLEBSIELLA PNEUMONIAE    Report Status 04/17/2023 FINAL  Final   Organism ID, Bacteria KLEBSIELLA PNEUMONIAE (A)  Final   Organism ID, Bacteria KLEBSIELLA PNEUMONIAE (A)  Final      Susceptibility   Klebsiella pneumoniae - MIC*    AMPICILLIN >=32 RESISTANT Resistant     CEFAZOLIN <=4 SENSITIVE Sensitive     CEFEPIME <=0.12 SENSITIVE Sensitive     CEFTRIAXONE <=0.25 SENSITIVE Sensitive     CIPROFLOXACIN <=0.25 SENSITIVE Sensitive     GENTAMICIN <=1 SENSITIVE Sensitive     IMIPENEM <=0.25 SENSITIVE Sensitive     NITROFURANTOIN 64 INTERMEDIATE Intermediate     TRIMETH/SULFA <=20 SENSITIVE Sensitive     AMPICILLIN/SULBACTAM >=32 RESISTANT Resistant     PIP/TAZO 8 SENSITIVE Sensitive ug/mL   Klebsiella pneumoniae - MIC*    AMPICILLIN >=32 RESISTANT Resistant     CEFAZOLIN <=4 SENSITIVE Sensitive     CEFEPIME <=0.12 SENSITIVE Sensitive     CEFTRIAXONE <=0.25 SENSITIVE Sensitive     CIPROFLOXACIN <=0.25 SENSITIVE Sensitive     GENTAMICIN <=1 SENSITIVE Sensitive     IMIPENEM <=0.25 SENSITIVE Sensitive     NITROFURANTOIN <=16 SENSITIVE Sensitive     TRIMETH/SULFA <=20 SENSITIVE Sensitive     AMPICILLIN/SULBACTAM >=32 RESISTANT Resistant     PIP/TAZO 16 SENSITIVE Sensitive ug/mL    * 70,000 COLONIES/mL KLEBSIELLA PNEUMONIAE    50,000 COLONIES/mL KLEBSIELLA PNEUMONIAE  Surgical pcr screen     Status: Abnormal   Collection Time: 04/18/23  4:53 AM   Specimen: Nasal Mucosa; Nasal Swab  Result Value Ref Range Status   MRSA, PCR NEGATIVE NEGATIVE Final   Staphylococcus aureus POSITIVE (A) NEGATIVE Final    Comment: (NOTE) The Xpert SA Assay (FDA  approved for NASAL specimens in patients 43 years of age and older), is one component of a comprehensive surveillance program. It is not intended to diagnose infection nor to guide or monitor treatment. Performed at  [facility], [address] [place], [place] [address]      Radiology Studies: No results found.   Adam Pert, MD, PhD [facility]  Between 7 am - 7 pm I am available, please contact me via Amion (for emergencies) or Securechat (non urgent messages)  Between 7 pm - 7 am I am not available, please contact night coverage MD/APP via Amion

## 2023-04-18 NOTE — Op Note (Signed)
Full Operative Report  Date of Operation: 8:14 AM, 04/18/2023   Patient: Adam Ponce - 72 y.o. male  Surgeon: Pilar Plate, DPM   Assistant: None  Diagnosis: Osteomyelitis left second toe  Procedure:  1.  Amputation of remaining second toe at MPJ level, left foot    Anesthesia: Monitor Anesthesia Care  [name], MD  Anesthesiologist: [name], MD CRNA: Little Ishikawa, CRNA   Estimated Blood Loss: Minimal   Hemostasis: 1) Anatomical dissection, mechanical compression, electrocautery 2) no tourniquet was used in the procedure  Implants: * No implants in log *  Materials: 3-0 Prolene  Injectables: 1) Pre-operatively: 10 cc of 50:50 mixture 1%lidocaine plain and 0.5% marcaine plain 2) Post-operatively: None   Specimens: Pathology: Left second toe stump for pathology microbiology: Second proximal phalanx bone for culture   Antibiotics: IV antibiotics given per schedule on the floor  Drains: None  Complications: Patient tolerated the procedure well without complication.   Operative findings: As below in detailed report  Indications for Procedure: Adam Ponce presents to Pilar Plate, DPM with a chief complaint of wound and concern for underlying osteomyelitis of the left second toe proximal phalanx status post prior partial amputation.  The patient has failed conservative treatments of various modalities. At this time the patient has elected to proceed with surgical correction. All alternatives, risks, and complications of the procedures were thoroughly explained to the patient. Patient exhibits appropriate understanding of all discussion points and informed consent was signed and obtained in the chart with no guarantees to surgical outcome given or implied.  Description of Procedure: Patient was brought to the operating room. Patient remained on their hospital bed in the supine position. A surgical timeout was performed and all  members of the operating room, the procedure, and the surgical site were identified. anesthesia occurred as per anesthesia record. Local anesthetic as previously described was then injected about the operative field in a local infiltrative block.  The operative lower extremity as noted above was then prepped and draped in the usual sterile manner. The following procedure then began.  Attention was directed to the second toe on the left foot.  Prior partial amputation.  There was remaining stump of the proximal phalanx.  A racquet type incision was made overlying the proximal phalanx full-thickness so as to excise the plantar ulceration present on the plantar aspect of the remaining toe.  The proximal phalanx and overlying soft tissue was excised in total and passed the back table.  A portion of the distal aspect of the proximal phalanx was resected with bone cutting forceps and sent for culture.  It was soft and noted to be eroded consistent with osteomyelitis.  The remainder of the toe was sent for pathology.  Next the metatarsal head was inspected.  There is no evidence of infection present in the metatarsal head which appeared viable.  Rongeur was used to remove necrotic fibrotic tissues surrounding the amputation site.  Pulse lavage and 1 L sterile saline was then used to irrigate the surgical amputation site.  Then using 3-0 Prolene the incision was closed under minimal tension.  Dressings were applied  The surgical site was then dressed with Xeroform 4 x 4 Kerlix Ace wrap. The patient tolerated both the procedure and anesthesia well with vital signs stable throughout. The patient was transferred in good condition and all vital signs stable  from the OR to recovery under the discretion of anesthesia.  Condition: Vital signs stable,  neurovascular status unchanged from preoperative   Surgical plan:  Expect clean surgical margin close primarily no complication noted.  Adequate bleeding from the amputation  site.  Minimal weightbearing as tolerated in a postop shoe for transfers only  The patient will be minimal weightbearing as tolerated in a postop shoe for transfers only to the operative limb until further instructed. The dressing is to remain clean, dry, and intact. Will continue to follow unless noted elsewhere.   Adam Ponce, DPM [facility]

## 2023-04-19 ENCOUNTER — Other Ambulatory Visit: Payer: Self-pay

## 2023-04-19 ENCOUNTER — Inpatient Hospital Stay (HOSPITAL_COMMUNITY): Payer: No Typology Code available for payment source | Admitting: Anesthesiology

## 2023-04-19 ENCOUNTER — Inpatient Hospital Stay (HOSPITAL_COMMUNITY): Payer: No Typology Code available for payment source

## 2023-04-19 ENCOUNTER — Encounter (HOSPITAL_COMMUNITY): Payer: Self-pay | Admitting: Podiatry

## 2023-04-19 ENCOUNTER — Encounter (HOSPITAL_COMMUNITY)
Admission: EM | Disposition: A | Discharge status: Skilled Nursing Facility | Payer: Self-pay | Source: Skilled Nursing Facility | Attending: Internal Medicine

## 2023-04-19 DIAGNOSIS — R7881 Bacteremia: Secondary | ICD-10-CM

## 2023-04-19 DIAGNOSIS — B9561 Methicillin susceptible Staphylococcus aureus infection as the cause of diseases classified elsewhere: Secondary | ICD-10-CM | POA: Diagnosis not present

## 2023-04-19 DIAGNOSIS — E11628 Type 2 diabetes mellitus with other skin complications: Secondary | ICD-10-CM | POA: Diagnosis not present

## 2023-04-19 DIAGNOSIS — I110 Hypertensive heart disease with heart failure: Secondary | ICD-10-CM

## 2023-04-19 DIAGNOSIS — E119 Type 2 diabetes mellitus without complications: Secondary | ICD-10-CM

## 2023-04-19 DIAGNOSIS — M86172 Other acute osteomyelitis, left ankle and foot: Secondary | ICD-10-CM | POA: Diagnosis not present

## 2023-04-19 DIAGNOSIS — I509 Heart failure, unspecified: Secondary | ICD-10-CM

## 2023-04-19 DIAGNOSIS — L089 Local infection of the skin and subcutaneous tissue, unspecified: Secondary | ICD-10-CM | POA: Diagnosis not present

## 2023-04-19 HISTORY — PX: TRANSESOPHAGEAL ECHOCARDIOGRAM (CATH LAB): EP1270

## 2023-04-19 LAB — GLUCOSE, CAPILLARY
Glucose-Capillary: 278 mg/dL — ABNORMAL HIGH (ref 70–99)
Glucose-Capillary: 195 mg/dL — ABNORMAL HIGH (ref 70–99)
Glucose-Capillary: 186 mg/dL — ABNORMAL HIGH (ref 70–99)
Glucose-Capillary: 162 mg/dL — ABNORMAL HIGH (ref 70–99)

## 2023-04-19 LAB — ECHO TEE

## 2023-04-19 LAB — CK: Total CK: 1528 U/L — ABNORMAL HIGH (ref 49–397)

## 2023-04-19 LAB — SURGICAL PATHOLOGY

## 2023-04-19 SURGERY — TRANSESOPHAGEAL ECHOCARDIOGRAM (TEE) (CATHLAB)
Anesthesia: Monitor Anesthesia Care

## 2023-04-19 MED ORDER — SODIUM CHLORIDE 0.9% FLUSH
10.0000 mL | Freq: Two times a day (BID) | INTRAVENOUS | Status: DC
  Administered 2023-04-19 – 2023-04-21 (×6): 10 mL
  Administered 2023-04-22: 20 mL
  Administered 2023-04-22 – 2023-04-23 (×2): 10 mL

## 2023-04-19 MED ORDER — LIDOCAINE 2% (20 MG/ML) 5 ML SYRINGE
INTRAMUSCULAR | Status: DC | PRN
  Administered 2023-04-19 (×2): 50 mg via INTRAVENOUS

## 2023-04-19 MED ORDER — PHENYLEPHRINE 80 MCG/ML (10ML) SYRINGE FOR IV PUSH (FOR BLOOD PRESSURE SUPPORT)
PREFILLED_SYRINGE | INTRAVENOUS | Status: DC | PRN
  Administered 2023-04-19: 80 ug via INTRAVENOUS
  Administered 2023-04-19: 160 ug via INTRAVENOUS

## 2023-04-19 MED ORDER — SODIUM CHLORIDE 0.9% FLUSH: 3.0000 mL | INTRAVENOUS | Status: DC | PRN

## 2023-04-19 MED ORDER — PROPOFOL 500 MG/50ML IV EMUL
INTRAVENOUS | Status: DC | PRN
  Administered 2023-04-19: 80 ug/kg/min via INTRAVENOUS

## 2023-04-19 MED ORDER — OXYCODONE HCL 5 MG PO TABS
10.0000 mg | ORAL_TABLET | ORAL | Status: DC | PRN
  Administered 2023-04-19 – 2023-04-23 (×13): 10 mg via ORAL
  Filled 2023-04-19 (×13): qty 2

## 2023-04-19 MED ORDER — ALUM & MAG HYDROXIDE-SIMETH 200-200-20 MG/5ML PO SUSP
15.0000 mL | Freq: Four times a day (QID) | ORAL | Status: DC | PRN
  Administered 2023-04-19 – 2023-04-23 (×8): 15 mL via ORAL
  Filled 2023-04-19 (×8): qty 30

## 2023-04-19 MED ORDER — SODIUM CHLORIDE 0.9% FLUSH: 10.0000 mL | INTRAVENOUS | Status: DC | PRN

## 2023-04-19 MED ORDER — SODIUM CHLORIDE 0.9 % IV SOLN
INTRAVENOUS | Status: DC | PRN
Start: 2023-04-19 — End: 2023-04-19

## 2023-04-19 MED ORDER — ENOXAPARIN SODIUM 60 MG/0.6ML IJ SOSY
60.0000 mg | PREFILLED_SYRINGE | Freq: Every day | INTRAMUSCULAR | Status: DC
  Administered 2023-04-19 – 2023-04-20 (×2): 60 mg via SUBCUTANEOUS
  Filled 2023-04-19 (×2): qty 0.6

## 2023-04-19 MED ORDER — CHLORHEXIDINE GLUCONATE CLOTH 2 % EX PADS
6.0000 | MEDICATED_PAD | Freq: Every day | CUTANEOUS | Status: DC
  Administered 2023-04-19 – 2023-04-23 (×5): 6 via TOPICAL

## 2023-04-19 MED ORDER — PROPOFOL 10 MG/ML IV BOLUS
INTRAVENOUS | Status: DC | PRN
  Administered 2023-04-19: 50 mg via INTRAVENOUS
  Administered 2023-04-19: 20 mg via INTRAVENOUS
  Administered 2023-04-19: 50 mg via INTRAVENOUS

## 2023-04-19 MED ORDER — SUCRALFATE 1 GM/10ML PO SUSP
2.0000 g | ORAL | Status: AC
  Administered 2023-04-19: 2 g via ORAL
  Filled 2023-04-19: qty 20

## 2023-04-19 MED ORDER — INSULIN ASPART 100 UNIT/ML IJ SOLN
3.0000 [IU] | Freq: Three times a day (TID) | INTRAMUSCULAR | Status: DC
  Administered 2023-04-19 – 2023-04-23 (×9): 3 [IU] via SUBCUTANEOUS

## 2023-04-19 MED ORDER — SODIUM CHLORIDE 0.9% FLUSH
3.0000 mL | Freq: Two times a day (BID) | INTRAVENOUS | Status: DC
Start: 2023-04-19 — End: 2023-04-19

## 2023-04-19 MED ORDER — PHENYLEPHRINE HCL-NACL 20-0.9 MG/250ML-% IV SOLN
INTRAVENOUS | Status: DC | PRN
  Administered 2023-04-19: 40 ug/min via INTRAVENOUS

## 2023-04-19 MED ORDER — INSULIN GLARGINE-YFGN 100 UNIT/ML ~~LOC~~ SOLN
15.0000 [IU] | Freq: Every day | SUBCUTANEOUS | Status: DC
  Administered 2023-04-19 – 2023-04-22 (×4): 15 [IU] via SUBCUTANEOUS
  Filled 2023-04-19 (×5): qty 0.15

## 2023-04-19 NOTE — Progress Notes (Signed)
  Subjective:  Patient ID: Adam Ponce, male    DOB: Sep 21, 1950,  MRN: [id number]  Patient seen at bedside resting comfortably with his family member, he is on the phone with his PCP at the [facility] as well  Negative for chest pain and shortness of breath Fever: no Night sweats: no Weight loss: no Review of all other systems is negative Objective:   Vitals:   04/19/23 0935 04/19/23 1147  BP: 133/63 125/65  Pulse: 74 72  Resp: 18 18  Temp: 97.9 F (36.6 C) 98.7 F (37.1 C)  SpO2: 95% 92%   General AA&O x3. Normal mood and affect.  Vascular Remaining toes warm and well-perfused  Neurologic Epicritic sensation grossly reduced.  Dermatologic Dressing is clean dry and intact  Orthopedic: Muscle function intact    Assessment & Plan:  Patient was evaluated and treated and all questions answered.  POD #1 left second toe amp -Dressing is clean dry and intact.  Leave intact until outpatient follow-up. -Follow-up with Dr. Malvin 1 to 2 weeks in the office -Weightbearing as tolerated to heel for transfers only in postop shoe -Plan is to go to SNF -Antibiotic plan is to have Ancef  2 g every 8 hours for 4 weeks due to bacteremia -Culture currently showing staph will follow with sensitivities  Juliene JONELLE Medicine, DPM  Accessible via secure chat for questions or concerns.

## 2023-04-19 NOTE — TOC Progression Note (Signed)
 Transition of Care [facility]) - Progression Note    Patient Details  Name: Adam Ponce MRN: [id number] Date of Birth: 29-Mar-1951  [facility]) CM/SW Contact  Montie LOISE Louder, [place] Phone Number: 04/19/2023, 3:58 PM  Clinical Narrative:     [name] confirmed availability- CSW Luann will start auth today.   Montie Louder, MSW, LCSW Clinical Social Worker    Expected Discharge Plan: Skilled Nursing Facility Barriers to Discharge: Continued Medical Work up  Expected Discharge Plan and Services In-house Referral: Clinical Social Work     Living arrangements for the past 2 months: Apartment                                       Social Determinants of Health (SDOH) Interventions SDOH Screenings   Food Insecurity: Food Insecurity Present (04/15/2023)  Housing: High Risk (04/15/2023)  Transportation Needs: Unmet Transportation Needs (04/15/2023)  Utilities: At Risk (04/15/2023)  Depression (PHQ2-9): Low Risk  (03/20/2020)  Recent Concern: Depression (PHQ2-9) - Medium Risk (01/10/2020)  Social Connections: Patient Declined (04/18/2023)  Tobacco Use: Medium Risk (04/18/2023)    Readmission Risk Interventions     No data to display

## 2023-04-19 NOTE — Progress Notes (Signed)
 Peripherally Inserted Central Catheter Placement  The IV Nurse has discussed with the patient and/or persons authorized to consent for the patient, the purpose of this procedure and the potential benefits and risks involved with this procedure.  The benefits include less needle sticks, lab draws from the catheter, and the patient may be discharged home with the catheter. Risks include, but not limited to, infection, bleeding, blood clot (thrombus formation), and puncture of an artery; nerve damage and irregular heartbeat and possibility to perform a PICC exchange if needed/ordered by physician.  Alternatives to this procedure were also discussed.  Bard Power PICC patient education guide, fact sheet on infection prevention and patient information card has been provided to patient /or left at bedside.    PICC Placement Documentation  PICC Single Lumen 04/19/23 Right Brachial 42 cm 1 cm (Active)  Indication for Insertion or Continuance of Line Home intravenous therapies (PICC only) 04/19/23 1330  Exposed Catheter (cm) 1 cm 04/19/23 1330  Site Assessment Clean, Dry, Intact 04/19/23 1330  Line Status Flushed;Blood return noted 04/19/23 1330  Dressing Type Transparent 04/19/23 1330  Dressing Status Antimicrobial disc in place;Clean, Dry, Intact 04/19/23 1330  Dressing Intervention New dressing 04/19/23 1330  Dressing Change Due 04/26/23 04/19/23 1330    Consent obtained by Charlies Angle RN   Adam Ponce 04/19/2023, 1:32 PM

## 2023-04-19 NOTE — Progress Notes (Signed)
 PT Cancellation Note  Patient Details Name: Adam Ponce MRN: [id number] DOB: 02-05-1951   Cancelled Treatment:    Reason Eval/Treat Not Completed: Patient at procedure or test/unavailable. Pt off floor for TEE. PT to re-attempt as time allows.   Erven Sari Shaker 04/19/2023, 8:11 AM

## 2023-04-19 NOTE — Anesthesia Postprocedure Evaluation (Signed)
 Anesthesia Post Note  Patient: Adam Ponce  Procedure(s) Performed: TRANSESOPHAGEAL ECHOCARDIOGRAM     Patient location during evaluation: PACU Anesthesia Type: MAC Level of consciousness: awake and alert Pain management: pain level controlled Vital Signs Assessment: post-procedure vital signs reviewed and stable Respiratory status: spontaneous breathing, nonlabored ventilation and respiratory function stable Cardiovascular status: blood pressure returned to baseline Postop Assessment: no apparent nausea or vomiting Anesthetic complications: no   No notable events documented.  Last Vitals:  Vitals:   04/19/23 0907 04/19/23 0917  BP: 103/65 112/61  Pulse: 81 80  Resp: 19 18  Temp:    SpO2: 97% 94%                  Vertell Row

## 2023-04-19 NOTE — Progress Notes (Signed)
 PROGRESS NOTE  Adam Ponce FMW:[id number] DOB: [birth year] DOA: 04/14/2023 PCP: System, Provider Not In   LOS: 4 days   Brief Narrative / Interim history: Adam Ponce is a 72 y.o. male with medical history significant of atrial flutter status post PPM on Coumadin, insulin -dependent type 2 diabetes, peripheral neuropathy, history of toe amputations, HIV, hypertension, hyperlipidemia, GERD, asthma/COPD, CKD stage IIIa, OSA, ILD, BPH presented to the ED after he had a fall due to generalized weakness - found down by maintenance at facility who called EMS -reports weakness after using bathroom with syncopal event - unknown downtime.   Subjective / 24h Interval events: Seen after his TEE, doing well.  No shortness of breath, 8 out of 10 pain in his feet.  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  ID and podiatry were consulted, and informally cardiology since he has a pacemaker -He is status post amputation of the remaining second toe on 04/08/2023.  Intraoperative cultures pending -Antibiotics per ID, on Unasyn  -TEE done this morning without vegetation on the pacemaker leads, no evidence of endocarditis -Final antibiotics plans per ID.  Needs SNF  Active problems Rhabdomyolysis -due to being down, CK continues to improve  AKI on CKD stage IIIa -Creatinine downtrending appropriately with supportive care, most recent creatinine ratio was 1.3 in 2020.  Creatinine was as high as 2.34 on admission, improved and  close to baseline for the past few days.  Repeat tomorrow morning   Heart failure, ruled out -Echo EF 50 to 55% low normal left ventricular function without wall motion abnormalities, without diastolic dysfunction    Mild hyponatremia - Asymptomatic, not clinically relevant, resolved with p.o. intake   Syncope - Likely vasovagal event. CT head negative for acute intracranial abnormality and no focal neurodeficit on exam. PE less likely given no tachycardia, chest pain, or hypoxia.    Generalized weakness - PT/OT eval, fall precautions.   Atrial flutter - Has a pacemaker.  per patient he was taken off coumadin by his PCP at the [facility].  TEE done this morning as above  Iddm, uncontrolled with hyperglycemia -SSI and long acting -continue hypoglycemic protocol. A1c 8.6  Lab Results  Component Value Date   HGBA1C 8.6 (H) 04/15/2023   CBG (last 3)  Recent Labs    04/18/23 2116 04/18/23 2354 04/19/23 0339  GLUCAP 262* 268* 278*     Hypertension -resume meds as necessary, normotensive now   Hyperlipidemia - Hold statin at this time in the setting of rhabdomyolysis.   HIV - resume home Descovy /prezcobix   Asthma/COPD - Stable, no signs of acute exacerbation.  ILD - stable/asymptomatic  BPH - stable asymptomatic  Scheduled Meds:  acetaminophen   1,000 mg Oral QID   azelastine   2 spray Each Nare BID   darunavir -cobicistat   1 tablet Oral Q breakfast   emtricitabine -tenofovir  AF  1 tablet Oral Daily   insulin  aspart  0-9 Units Subcutaneous Q4H   insulin  glargine-yfgn  10 Units Subcutaneous QHS   pantoprazole   40 mg Oral  Daily   Continuous Infusions:   ceFAZolin  (ANCEF ) IV 2 g (04/19/23 0546)   PRN Meds:.albuterol , HYDROcodone -acetaminophen   Current Outpatient Medications  Medication Instructions   albuterol  (PROVENTIL  HFA;VENTOLIN  HFA) 108 (90 Base) MCG/ACT inhaler Every 6 hours PRN   Azelastine  HCl 137 MCG/SPRAY SOLN 2 sprays, As needed   bacitracin 500 UNIT/GM ointment 1  Application, 2 times daily   benzonatate (TESSALON) 100 mg, Every 8 hours PRN   betamethasone valerate (VALISONE) 0.1 % cream 2 times daily   Cholecalciferol (VITAMIN D3) 10000 units TABS Take by mouth.   cyanocobalamin (VITAMIN B12) 1,000 mcg,  Once   darunavir  (PREZISTA ) 800 mg   darunavir -cobicistat  (PREZCOBIX ) 800-150 MG tablet Oral   docusate sodium  (COLACE) 100 mg, 2 times daily   emtricitabine -tenofovir  AF (DESCOVY ) 200-25 MG tablet 1 tablet, Oral, Daily   ezetimibe (ZETIA) 10 mg, Daily   famotidine (PEPCID) 20 mg, 2 times daily   fenofibrate (TRICOR) 145 mg, Daily   glipiZIDE (GLUCOTROL) 10 mg, Daily before breakfast   guaiFENesin (MUCINEX) 600 mg, Every 12 hours PRN   hydrocerin (EUCERIN) CREA 1 Application, As needed   INSULIN  GLARGINE Barnhart 16 Units, Daily at bedtime   ipratropium (ATROVENT) 0.06 % nasal spray 2 sprays, 3 times daily PRN   metFORMIN (GLUCOPHAGE) 500 mg, 2 times daily with meals   pravastatin (PRAVACHOL) 80 mg, Daily   pregabalin  (LYRICA ) 100 mg, 2 times daily   ritonavir (NORVIR) 100 MG TABS tablet Take by mouth.   Semaglutide (2 MG/DOSE) 2 mg, Subcutaneous, Weekly, Inject on Thursday   sennosides-docusate sodium  (SENOKOT-S) 8.6-50 MG tablet 1 tablet, Daily   sodium chloride  (OCEAN) 0.65 % nasal spray 1 spray, 4 times daily PRN   terazosin (HYTRIN) 2 MG capsule 1 capsule, Oral, Daily at bedtime   traMADol  (ULTRAM ) 50 MG tablet Every 6 hours PRN   triamcinolone cream (KENALOG) 0.1 % 1 Application, Daily PRN   valACYclovir (VALTREX) 1,000 mg, 2 times daily    Diet Orders (From admission, onward)     Start     Ordered   04/19/23 1030  Diet Heart Fluid consistency: Thin  Diet effective now       Question:  Fluid consistency:  Answer:  Thin   04/19/23 1029            DVT prophylaxis:    Lab Results  Component Value Date   PLT 227 04/18/2023      Code Status: Do not attempt resuscitation (DNR) PRE-ARREST INTERVENTIONS DESIRED  Family  Communication: No family at bedside   Status is: Inpatient Remains inpatient appropriate because: severity of illness  Level of care: Telemetry Medical  Consultants:  Podiatry ID  Objective: Vitals:   04/19/23 0857 04/19/23 0907 04/19/23 0917 04/19/23 0935  BP: 120/67 103/65 112/61 133/63  Pulse: 76 81 80 74  Resp: 20 19 18 18   Temp: 98.7 F (37.1 C)   97.9 F (36.6 C)  TempSrc: Tympanic   Oral  SpO2: 95% 97% 94% 95%  Weight:      Height:        Intake/Output Summary (Last 24 hours) at 04/19/2023 1109 Last data filed at 04/19/2023 0850 Gross per 24 hour  Intake 400 ml  Output --  Net 400 ml   Wt Readings from Last 3 Encounters:  04/18/23 126 kg  10/28/21 126.1 kg  03/18/20 127.3 kg    Examination:  Constitutional: NAD Eyes: lids and conjunctivae normal, no scleral icterus ENMT: mmm Neck: normal, supple Respiratory: clear  to auscultation bilaterally, no wheezing, no crackles. Normal respiratory effort.  Cardiovascular: Regular rate and rhythm, no murmurs / rubs / gallops. No LE edema. Abdomen: soft, no distention, no tenderness. Bowel sounds positive.    Data Reviewed: I have independently reviewed following labs and imaging studies   CBC Recent Labs  Lab 04/14/23 2116 04/15/23 0107 04/16/23 0852 04/17/23 0655 04/18/23 0431  WBC 15.3* 15.4* 12.2* 11.0* 10.3  HGB 14.3 13.7 13.6 13.3 13.3  HCT 42.7 40.0 39.6 38.3* 38.5*  PLT 238 224 200 214 227  MCV 93.8 93.5 93.8 92.1 91.4  MCH 31.4 32.0 32.2 32.0 31.6  MCHC 33.5 34.3 34.3 34.7 34.5  RDW 14.3 14.4 13.9 13.8 13.6  LYMPHSABS 1.9  --   --   --   --   MONOABS 1.4*  --   --   --   --   EOSABS 0.0  --   --   --   --   BASOSABS 0.0  --   --   --   --     Recent Labs  Lab 04/14/23 2116 04/15/23 0107 04/15/23 0115 04/15/23 0302 04/16/23 0852 04/17/23 0655 04/18/23 0431  NA 133* 131*  --   --  131* 130* 131*  K 4.8 4.4  --   --  4.5 4.2 4.2  CL 100 98  --   --  100 98 96*  CO2 21* 19*  --   --   23 22 23   GLUCOSE 247* 266*  --   --  232* 211* 223*  BUN 25* 27*  --   --  19 19 18   CREATININE 2.34* 2.16*  --   --  1.86* 1.47* 1.45*  CALCIUM 9.8 9.3  --   --  8.9 8.6* 8.5*  AST 39  --   --   --   --   --   --   ALT 22  --   --   --   --   --   --   ALKPHOS 55  --   --   --   --   --   --   BILITOT 0.8  --   --   --   --   --   --   ALBUMIN 3.6  --   --   --   --   --   --   CRP  --  18.4*  --   --   --   --   --   LATICACIDVEN  --   --  1.3 1.3  --   --   --   INR  --  1.2  --   --   --   --   --   HGBA1C  --  8.6*  --   --   --   --   --   BNP  --  275.4*  --   --   --   --   --     ------------------------------------------------------------------------------------------------------------------ No results for input(s): CHOL, HDL, LDLCALC, TRIG, CHOLHDL, LDLDIRECT in the last 72 hours.  Lab Results  Component Value Date   HGBA1C 8.6 (H) 04/15/2023   ------------------------------------------------------------------------------------------------------------------ No results for input(s): TSH, T4TOTAL, T3FREE, THYROIDAB in the last 72 hours.  Invalid input(s): FREET3  Cardiac Enzymes No results for input(s): CKMB, TROPONINI, MYOGLOBIN in the last 168 hours.  Invalid input(s): CK ------------------------------------------------------------------------------------------------------------------    Component Value Date/Time   BNP 275.4 (H) 04/15/2023 0107  CBG: Recent Labs  Lab 04/18/23 1121 04/18/23 1657 04/18/23 2116 04/18/23 2354 04/19/23 0339  GLUCAP 283* 292* 262* 268* 278*    Recent Results (from the past 240 hours)  Blood Culture (routine x 2)     Status: None (Preliminary result)   Collection Time: 04/15/23  1:07 AM   Specimen: BLOOD RIGHT HAND  Result Value Ref Range Status   Specimen Description BLOOD RIGHT HAND  Final   Special Requests   Final    BOTTLES DRAWN AEROBIC AND ANAEROBIC Blood Culture adequate volume    Culture   Final    NO GROWTH 4 DAYS Performed at [facility], [address], [place], [place] [address]    Report Status PENDING  Incomplete  Blood Culture (routine x 2)     Status: Abnormal   Collection Time: 04/15/23  1:07 AM   Specimen: BLOOD RIGHT HAND  Result Value Ref Range Status   Specimen Description BLOOD RIGHT HAND  Final   Special Requests   Final    BOTTLES DRAWN AEROBIC AND ANAEROBIC Blood Culture adequate volume   Culture  Setup Time   Final    GRAM POSITIVE COCCI ANAEROBIC BOTTLE ONLY CRITICAL RESULT CALLED TO, READ BACK BY AND VERIFIED WITH: PHARMD L. LAURENCE [id number] @ 2204 FH Performed at [facility], [address] [place], [place] [address]    Culture STAPHYLOCOCCUS AUREUS (A)  Final   Report Status 04/17/2023 FINAL  Final   Organism ID, Bacteria STAPHYLOCOCCUS AUREUS  Final      Susceptibility   Staphylococcus aureus - MIC*    CIPROFLOXACIN <=0.5 SENSITIVE Sensitive     ERYTHROMYCIN >=8 RESISTANT Resistant     GENTAMICIN  <=0.5 SENSITIVE Sensitive     OXACILLIN <=0.25 SENSITIVE Sensitive     TETRACYCLINE <=1 SENSITIVE Sensitive     VANCOMYCIN  <=0.5 SENSITIVE Sensitive     TRIMETH/SULFA 160 RESISTANT Resistant     CLINDAMYCIN <=0.25 SENSITIVE Sensitive     RIFAMPIN <=0.5 SENSITIVE Sensitive     Inducible Clindamycin NEGATIVE Sensitive     LINEZOLID 2 SENSITIVE Sensitive     * STAPHYLOCOCCUS AUREUS  Blood Culture ID Panel (Reflexed)     Status: Abnormal   Collection Time: 04/15/23  1:07 AM  Result Value Ref Range Status   Enterococcus faecalis NOT DETECTED NOT DETECTED Final   Enterococcus Faecium NOT DETECTED NOT DETECTED Final   Listeria monocytogenes NOT DETECTED NOT DETECTED Final   Staphylococcus species DETECTED (A) NOT DETECTED Final    Comment: CRITICAL RESULT CALLED TO, READ BACK BY AND VERIFIED WITH: PHARMD L. CHEN [id number] @ 2204 FH    Staphylococcus aureus (BCID) DETECTED (A) NOT DETECTED Final    Comment: CRITICAL RESULT CALLED  TO, READ BACK BY AND VERIFIED WITH: PHARMD L. LAURENCE [id number] @ 2204 FH    Staphylococcus epidermidis NOT DETECTED NOT DETECTED Final   Staphylococcus lugdunensis NOT DETECTED NOT DETECTED Final   Streptococcus species NOT DETECTED NOT DETECTED Final   Streptococcus agalactiae NOT DETECTED NOT DETECTED Final   Streptococcus pneumoniae NOT DETECTED NOT DETECTED Final   Streptococcus pyogenes NOT DETECTED NOT DETECTED Final   A.calcoaceticus-baumannii NOT DETECTED NOT DETECTED Final   Bacteroides fragilis NOT DETECTED NOT DETECTED Final   Enterobacterales NOT DETECTED NOT DETECTED Final   Enterobacter cloacae complex NOT DETECTED NOT DETECTED Final   Escherichia coli NOT DETECTED NOT DETECTED Final   Klebsiella aerogenes NOT DETECTED NOT DETECTED Final   Klebsiella oxytoca NOT DETECTED NOT  DETECTED Final   Klebsiella pneumoniae NOT DETECTED NOT DETECTED Final   Proteus species NOT DETECTED NOT DETECTED Final   Salmonella species NOT DETECTED NOT DETECTED Final   Serratia marcescens NOT DETECTED NOT DETECTED Final   Haemophilus influenzae NOT DETECTED NOT DETECTED Final   Neisseria meningitidis NOT DETECTED NOT DETECTED Final   Pseudomonas aeruginosa NOT DETECTED NOT DETECTED Final   Stenotrophomonas maltophilia NOT DETECTED NOT DETECTED Final   Candida albicans NOT DETECTED NOT DETECTED Final   Candida auris NOT DETECTED NOT DETECTED Final   Candida glabrata NOT DETECTED NOT DETECTED Final   Candida krusei NOT DETECTED NOT DETECTED Final   Candida parapsilosis NOT DETECTED NOT DETECTED Final   Candida tropicalis NOT DETECTED NOT DETECTED Final   Cryptococcus neoformans/gattii NOT DETECTED NOT DETECTED Final   Meth resistant mecA/C and MREJ NOT DETECTED NOT DETECTED Final    Comment: Performed at [facility], [address], [place], [place] [address]  Urine Culture     Status: Abnormal   Collection Time: 04/15/23  3:49 AM   Specimen: Urine, Clean Catch  Result Value Ref Range  Status   Specimen Description URINE, CLEAN CATCH  Final   Special Requests   Final    NONE Performed at [facility], [address], [place], [place] [address]    Culture (A)  Final    70,000 COLONIES/mL KLEBSIELLA PNEUMONIAE 50,000 COLONIES/mL KLEBSIELLA PNEUMONIAE    Report Status 04/17/2023 FINAL  Final   Organism ID, Bacteria KLEBSIELLA PNEUMONIAE (A)  Final   Organism ID, Bacteria KLEBSIELLA PNEUMONIAE (A)  Final      Susceptibility   Klebsiella pneumoniae - MIC*    AMPICILLIN  >=32 RESISTANT Resistant     CEFAZOLIN  <=4 SENSITIVE Sensitive     CEFEPIME  <=0.12 SENSITIVE Sensitive     CEFTRIAXONE <=0.25 SENSITIVE Sensitive     CIPROFLOXACIN <=0.25 SENSITIVE Sensitive     GENTAMICIN  <=1 SENSITIVE Sensitive     IMIPENEM <=0.25 SENSITIVE Sensitive     NITROFURANTOIN 64 INTERMEDIATE Intermediate     TRIMETH/SULFA <=20 SENSITIVE Sensitive     AMPICILLIN /SULBACTAM >=32 RESISTANT Resistant     PIP/TAZO 8 SENSITIVE Sensitive ug/mL   Klebsiella pneumoniae - MIC*    AMPICILLIN  >=32 RESISTANT Resistant     CEFAZOLIN  <=4 SENSITIVE Sensitive     CEFEPIME  <=0.12 SENSITIVE Sensitive     CEFTRIAXONE <=0.25 SENSITIVE Sensitive     CIPROFLOXACIN <=0.25 SENSITIVE Sensitive     GENTAMICIN  <=1 SENSITIVE Sensitive     IMIPENEM <=0.25 SENSITIVE Sensitive     NITROFURANTOIN <=16 SENSITIVE Sensitive     TRIMETH/SULFA <=20 SENSITIVE Sensitive     AMPICILLIN /SULBACTAM >=32 RESISTANT Resistant     PIP/TAZO 16 SENSITIVE Sensitive ug/mL    * 70,000 COLONIES/mL KLEBSIELLA PNEUMONIAE    50,000 COLONIES/mL KLEBSIELLA PNEUMONIAE  Culture, blood (Routine X 2) w Reflex to ID Panel     Status: None (Preliminary result)   Collection Time: 04/17/23  6:55 AM   Specimen: BLOOD RIGHT HAND  Result Value Ref Range Status   Specimen Description BLOOD RIGHT HAND  Final   Special Requests   Final    BOTTLES DRAWN AEROBIC AND ANAEROBIC Blood Culture results may not be optimal due to an inadequate volume of  blood received in culture bottles   Culture   Final    NO GROWTH 2 DAYS Performed at [facility], [address], [place], [place] [address]    Report Status PENDING  Incomplete  Culture, blood (Routine X 2) w Reflex to ID Panel     Status: None (Preliminary result)   Collection Time: 04/17/23  6:59 AM   Specimen: BLOOD RIGHT ARM  Result Value Ref Range Status   Specimen Description BLOOD RIGHT ARM  Final   Special Requests   Final    BOTTLES DRAWN AEROBIC AND ANAEROBIC Blood Culture results may not be optimal due to an inadequate volume of blood received in culture bottles   Culture   Final    NO GROWTH 2 DAYS Performed at [facility], [address] [place], [place] [address]    Report Status PENDING  Incomplete  Surgical pcr screen     Status: Abnormal   Collection Time: 04/18/23  4:53 AM   Specimen: Nasal Mucosa; Nasal Swab  Result Value Ref Range Status   MRSA, PCR NEGATIVE NEGATIVE Final   Staphylococcus aureus POSITIVE (A) NEGATIVE Final    Comment: (NOTE) The Xpert SA Assay (FDA approved for NASAL specimens in patients 53 years of age and older), is one component of a comprehensive surveillance program. It is not intended to diagnose infection nor to guide or monitor treatment. Performed at [facility], [address] [place], [place] [address]   Aerobic/Anaerobic Culture w Gram Stain (surgical/deep wound)     Status: None (Preliminary result)   Collection Time: 04/18/23  8:07 AM   Specimen: PATH Digit amputation; Tissue  Result Value Ref Range Status   Specimen Description TISSUE  Final   Special Requests left 2nd toe  Final   Gram Stain   Final    RARE WBC PRESENT, PREDOMINANTLY PMN RARE GRAM POSITIVE COCCI IN PAIRS Performed at [facility], [address] [place], [place] [address]    Culture FEW STAPHYLOCOCCUS AUREUS  Final   Report Status PENDING  Incomplete     Radiology Studies: ECHO TEE Result Date: 04/19/2023     TRANSESOPHOGEAL ECHO REPORT   Patient Name:   MILDRED BOLLARD Date of Exam: 04/19/2023 Medical Rec #:  [id number]     Height:       71.0 in Accession #:    [id number]    Weight:       277.8 lb Date of Birth:  26-Jun-1950      BSA:          2.424 m Patient Age:    72 years      BP:           130/57 mmHg Patient Gender: M             HR:           83 bpm. Exam Location:  Inpatient Procedure: Transesophageal Echo, Cardiac Doppler and Color Doppler Indications:     Bacteremia  History:         Patient has prior history of Echocardiogram examinations, most                  recent 04/15/2023. CHF, Pacemaker, Signs/Symptoms:Syncope; Risk                  Factors:Hypertension and Diabetes.  Sonographer:     Thea Norlander RCS Referring Phys:  [id number] HAO MENG Diagnosing Phys: Vinie Maxcy MD PROCEDURE: After discussion of the risks and benefits of a TEE, an informed consent was obtained from the patient. The transesophogeal probe was passed without difficulty through the esophogus of the patient. Imaged were obtained with the  patient in a supine position. Sedation performed by different physician. The patient was monitored while under deep sedation. Anesthestetic sedation was provided intravenously by Anesthesiology: 220.8mg  of Propofol , 100mg  of Lidocaine . The patient developed no complications during the procedure.  IMPRESSIONS  1. Left ventricular ejection fraction, by estimation, is 45 to 50%. The left ventricle has mildly decreased function. The left ventricle demonstrates global hypokinesis. There is mild left ventricular hypertrophy.  2. Right ventricular systolic function is normal. The right ventricular size is normal.  3. No left atrial/left atrial appendage thrombus was detected.  4. Right atrial size was mildly dilated.  5. The mitral valve is grossly normal. Trivial mitral valve regurgitation.  6. The aortic valve is tricuspid. Aortic valve regurgitation is not visualized.  7. Agitated saline contrast bubble  study was negative, with no evidence of any interatrial shunt. Conclusion(s)/Recommendation(s): No evidence of vegetation/infective endocarditis on this transesophageael echocardiogram. FINDINGS  Left Ventricle: Left ventricular ejection fraction, by estimation, is 45 to 50%. The left ventricle has mildly decreased function. The left ventricle demonstrates global hypokinesis. The left ventricular internal cavity size was normal in size. There is  mild left ventricular hypertrophy. Right Ventricle: The right ventricular size is normal. No increase in right ventricular wall thickness. Right ventricular systolic function is normal. Left Atrium: Left atrial size was normal in size. No left atrial/left atrial appendage thrombus was detected. Right Atrium: Right atrial size was mildly dilated. Pericardium: Trivial pericardial effusion is present. The pericardial effusion is posterior to the left ventricle. Mitral Valve: The mitral valve is grossly normal. Trivial mitral valve regurgitation. Tricuspid Valve: The tricuspid valve is grossly normal. Tricuspid valve regurgitation is mild. Aortic Valve: The aortic valve is tricuspid. Aortic valve regurgitation is not visualized. Pulmonic Valve: The pulmonic valve was grossly normal. Pulmonic valve regurgitation is trivial. Aorta: The aortic root and ascending aorta are structurally normal, with no evidence of dilitation. IAS/Shunts: No atrial level shunt detected by color flow Doppler. Agitated saline contrast bubble study was negative, with no evidence of any interatrial shunt. Additional Comments: A device lead is visualized. Spectral Doppler performed.  AORTA Ao Asc diam: 3.40 cm TRICUSPID VALVE TR Peak grad:   30.2 mmHg TR Vmax:        275.00 cm/s Vinie Maxcy MD Electronically signed by Vinie Maxcy MD Signature Date/Time: 04/19/2023/10:02:01 AM    Final    EP STUDY Result Date: 04/19/2023 See surgical note for result.    Nilda Fendt, MD, PhD [facility]  Between 7 am - 7 pm I am available, please contact me via Amion (for emergencies) or Securechat (non urgent messages)  Between 7 pm - 7 am I am not available, please contact night coverage MD/APP via Amion

## 2023-04-19 NOTE — Progress Notes (Signed)
 PHARMACY CONSULT NOTE FOR:  OUTPATIENT  PARENTERAL ANTIBIOTIC THERAPY (OPAT)  Indication: MSSA bacteremia/osteo Regimen: Cefazolin  2g IV every 8 hours End date: 05/16/23 (4 weeks from OR 12/30)  IV antibiotic discharge orders are pended. To discharging provider:  please sign these orders via discharge navigator,  Select New Orders & click on the button choice - Manage This Unsigned Work.     Thank you for allowing pharmacy to be a part of this patient's care.  Almarie Lunger, PharmD, BCPS, BCIDP Infectious Diseases Clinical Pharmacist 04/19/2023 11:24 AM   **Pharmacist phone directory can now be found on [email or web address] ([email or web address]).  Listed under [facility].

## 2023-04-19 NOTE — Progress Notes (Signed)
 [facility]  Date of Admission:  04/14/2023      Total days of antibiotics 4   Cefazolin            ASSESSMENT: Adam Ponce is a 72 y.o. male admitted with:    MSSA Bacteremia -  PPM in Situ -  BCx ( + ) 12/26, ( - ) preliminary 12/28. TEE 12/31 without any concerns for native valve or lead vegetations.  No further fevers since 12/29 o/n. Will see what EP thinks about case. Will plan 4 weeks IV ABX given no evidence of device infection and alternative source that has since been surgically treated. -continue cefazolin   -FU EP recs   -will need surveillance cultures after he completes full course    DU Left Foot -  Osteomyelitis -  Staph Aureus on Cx -  Now s/p amputation of 2nd ray with podiatry team. Staph aureus growing on cultures. Path not back.  -Follow cultures & pathology report.  -Continue cefazolin  - planning 4 weeks to treat osteomyelitis and associated bacteremia.    HIV -  Continues on Prezcobix  and Descovy  together once daily with food  Managed at [facility] - Last CD4 was 1026 in Dec with VL < 20 copies  -nothing further outside of continuation of regimen   Vascular Access -  -PICC ordered and placed 12/31 -Home health orders to maintain PICC line care and education for patient described below - plans for SNF in discussion   Discharge Planning / Coordination of Care -  -Outpatient antibiotics set -Plans for SNF placement from what I can tell in [facility] team notes  Medication Monitoring -  -Safety labs ordered and detailed below to be followed in OPAT clinic    ID will sign off - please call back with any questions/concerns or if we can be of further assistance.    PLAN: IV opat as outlined below  FU micro through to maturity  FU EP plans  Needs surveillance cultures after off abx   OPAT ORDERS:  Diagnosis: Acute Osteomyelitis Toe, Bacteremia   Culture Result: MSSA   Allergies  Allergen Reactions   Ciprofloxacin  Other (See Comments)    unknown   Ciprofloxacin-Dexamethasone  Other (See Comments)    unknown   Codeine      Discharge antibiotics to be given via PICC line:  Cefazolin  2 gm IV TID   Duration: 4 weeks   End Date: 05/16/23  [facility] Care Per Protocol with [employer] Use: Home health RN for IV administration and teaching, line care and labs.    Labs weekly while on IV antibiotics: _x_ CBC with differential __ BMP **TWICE WEEKLY ON VANCOMYCIN   _x_ CMP _x_ CRP _x_ ESR __ Vancomycin  trough TWICE WEEKLY __ CK  _x_ Please pull PIC at completion of IV antibiotics __ Please leave PIC in place until doctor has seen patient or been notified  Fax weekly labs to [phone]  Clinic Follow Up Appt: Dr. Efrain 05/25/2023 @ 1:30 pm    Principal Problem:   Diabetic foot infection (HCC) Active Problems:   Type 2 diabetes mellitus (HCC)   Essential hypertension, benign   Rhabdomyolysis   Acute-on-chronic kidney injury (HCC)   CHF (congestive heart failure) (HCC)   UTI (urinary tract infection)   Syncope   Generalized weakness   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)   Severe sepsis (HCC)    acetaminophen   1,000 mg Oral QID  azelastine   2 spray Each Nare BID   darunavir -cobicistat   1 tablet Oral Q breakfast   emtricitabine -tenofovir  AF  1 tablet Oral Daily   insulin  aspart  0-9 Units Subcutaneous Q4H   insulin  glargine-yfgn  10 Units Subcutaneous QHS   pantoprazole   40 mg Oral Daily    SUBJECTIVE: Has hiccoughs from cold water.  Wants us  to give his doc at the [facility] a call about his plan.    Review of Systems: Review of Systems  Constitutional:  Negative for chills, fever, malaise/fatigue and weight loss.  Skin:  Negative for rash.  Neurological:  Negative for dizziness.    Allergies  Allergen Reactions   Ciprofloxacin Other (See Comments)    unknown   Ciprofloxacin-Dexamethasone  Other (See Comments)    unknown   Codeine     OBJECTIVE: Vitals:    04/19/23 0857 04/19/23 0907 04/19/23 0917 04/19/23 0935  BP: 120/67 103/65 112/61 133/63  Pulse: 76 81 80 74  Resp: 20 19 18 18   Temp: 98.7 F (37.1 C)   97.9 F (36.6 C)  TempSrc: Tympanic   Oral  SpO2: 95% 97% 94% 95%  Weight:      Height:       Body mass index is 38.74 kg/m.  Physical Exam Constitutional:      Appearance: Normal appearance. He is not ill-appearing.  HENT:     Head: Normocephalic.     Mouth/Throat:     Mouth: Mucous membranes are moist.     Pharynx: Oropharynx is clear.  Eyes:     General: No scleral icterus. Cardiovascular:     Rate and Rhythm: Normal rate and regular rhythm.  Pulmonary:     Effort: Pulmonary effort is normal.  Abdominal:     General: Bowel sounds are normal. There is no distension.     Palpations: Abdomen is soft.  Musculoskeletal:        General: Normal range of motion.     Cervical back: Normal range of motion.     Comments: Dressings clean and dry   Skin:    Coloration: Skin is not jaundiced or pale.  Neurological:     Mental Status: He is alert and oriented to person, place, and time.  Psychiatric:        Mood and Affect: Mood normal.        Judgment: Judgment normal.     Lab Results Lab Results  Component Value Date   WBC 10.3 04/18/2023   HGB 13.3 04/18/2023   HCT 38.5 (L) 04/18/2023   MCV 91.4 04/18/2023   PLT 227 04/18/2023    Lab Results  Component Value Date   CREATININE 1.45 (H) 04/18/2023   BUN 18 04/18/2023   NA 131 (L) 04/18/2023   K 4.2 04/18/2023   CL 96 (L) 04/18/2023   CO2 23 04/18/2023    Lab Results  Component Value Date   ALT 22 04/14/2023   AST 39 04/14/2023   ALKPHOS 55 04/14/2023   BILITOT 0.8 04/14/2023     Microbiology: Recent Results (from the past 240 hours)  Blood Culture (routine x 2)     Status: None (Preliminary result)   Collection Time: 04/15/23  1:07 AM   Specimen: BLOOD RIGHT HAND  Result Value Ref Range Status   Specimen Description BLOOD RIGHT HAND  Final    Special Requests   Final    BOTTLES DRAWN AEROBIC AND ANAEROBIC Blood Culture adequate volume   Culture   Final  NO GROWTH 4 DAYS Performed at [facility], [address], [place], [place] [address]    Report Status PENDING  Incomplete  Blood Culture (routine x 2)     Status: Abnormal   Collection Time: 04/15/23  1:07 AM   Specimen: BLOOD RIGHT HAND  Result Value Ref Range Status   Specimen Description BLOOD RIGHT HAND  Final   Special Requests   Final    BOTTLES DRAWN AEROBIC AND ANAEROBIC Blood Culture adequate volume   Culture  Setup Time   Final    GRAM POSITIVE COCCI ANAEROBIC BOTTLE ONLY CRITICAL RESULT CALLED TO, READ BACK BY AND VERIFIED WITH: PHARMD L. LAURENCE [id number] @ 2204 FH Performed at [facility], [address], [place], [place] [address]    Culture STAPHYLOCOCCUS AUREUS (A)  Final   Report Status 04/17/2023 FINAL  Final   Organism ID, Bacteria STAPHYLOCOCCUS AUREUS  Final      Susceptibility   Staphylococcus aureus - MIC*    CIPROFLOXACIN <=0.5 SENSITIVE Sensitive     ERYTHROMYCIN >=8 RESISTANT Resistant     GENTAMICIN  <=0.5 SENSITIVE Sensitive     OXACILLIN <=0.25 SENSITIVE Sensitive     TETRACYCLINE <=1 SENSITIVE Sensitive     VANCOMYCIN  <=0.5 SENSITIVE Sensitive     TRIMETH/SULFA 160 RESISTANT Resistant     CLINDAMYCIN <=0.25 SENSITIVE Sensitive     RIFAMPIN <=0.5 SENSITIVE Sensitive     Inducible Clindamycin NEGATIVE Sensitive     LINEZOLID 2 SENSITIVE Sensitive     * STAPHYLOCOCCUS AUREUS  Blood Culture ID Panel (Reflexed)     Status: Abnormal   Collection Time: 04/15/23  1:07 AM  Result Value Ref Range Status   Enterococcus faecalis NOT DETECTED NOT DETECTED Final   Enterococcus Faecium NOT DETECTED NOT DETECTED Final   Listeria monocytogenes NOT DETECTED NOT DETECTED Final   Staphylococcus species DETECTED (A) NOT DETECTED Final    Comment: CRITICAL RESULT CALLED TO, READ BACK BY AND VERIFIED WITH: PHARMD L. CHEN [id number] @ 2204 FH     Staphylococcus aureus (BCID) DETECTED (A) NOT DETECTED Final    Comment: CRITICAL RESULT CALLED TO, READ BACK BY AND VERIFIED WITH: PHARMD L. LAURENCE [id number] @ 2204 FH    Staphylococcus epidermidis NOT DETECTED NOT DETECTED Final   Staphylococcus lugdunensis NOT DETECTED NOT DETECTED Final   Streptococcus species NOT DETECTED NOT DETECTED Final   Streptococcus agalactiae NOT DETECTED NOT DETECTED Final   Streptococcus pneumoniae NOT DETECTED NOT DETECTED Final   Streptococcus pyogenes NOT DETECTED NOT DETECTED Final   A.calcoaceticus-baumannii NOT DETECTED NOT DETECTED Final   Bacteroides fragilis NOT DETECTED NOT DETECTED Final   Enterobacterales NOT DETECTED NOT DETECTED Final   Enterobacter cloacae complex NOT DETECTED NOT DETECTED Final   Escherichia coli NOT DETECTED NOT DETECTED Final   Klebsiella aerogenes NOT DETECTED NOT DETECTED Final   Klebsiella oxytoca NOT DETECTED NOT DETECTED Final   Klebsiella pneumoniae NOT DETECTED NOT DETECTED Final   Proteus species NOT DETECTED NOT DETECTED Final   Salmonella species NOT DETECTED NOT DETECTED Final   Serratia marcescens NOT DETECTED NOT DETECTED Final   Haemophilus influenzae NOT DETECTED NOT DETECTED Final   Neisseria meningitidis NOT DETECTED NOT DETECTED Final   Pseudomonas aeruginosa NOT DETECTED NOT DETECTED Final   Stenotrophomonas maltophilia NOT DETECTED NOT DETECTED Final   Candida albicans NOT DETECTED NOT DETECTED Final   Candida auris NOT DETECTED NOT DETECTED Final   Candida glabrata NOT DETECTED NOT DETECTED Final   Candida  krusei NOT DETECTED NOT DETECTED Final   Candida parapsilosis NOT DETECTED NOT DETECTED Final   Candida tropicalis NOT DETECTED NOT DETECTED Final   Cryptococcus neoformans/gattii NOT DETECTED NOT DETECTED Final   Meth resistant mecA/C and MREJ NOT DETECTED NOT DETECTED Final    Comment: Performed at [facility], [address] [place], [place] [address]  Urine Culture     Status: Abnormal    Collection Time: 04/15/23  3:49 AM   Specimen: Urine, Clean Catch  Result Value Ref Range Status   Specimen Description URINE, CLEAN CATCH  Final   Special Requests   Final    NONE Performed at [facility], [address], [place], [place] [address]    Culture (A)  Final    70,000 COLONIES/mL KLEBSIELLA PNEUMONIAE 50,000 COLONIES/mL KLEBSIELLA PNEUMONIAE    Report Status 04/17/2023 FINAL  Final   Organism ID, Bacteria KLEBSIELLA PNEUMONIAE (A)  Final   Organism ID, Bacteria KLEBSIELLA PNEUMONIAE (A)  Final      Susceptibility   Klebsiella pneumoniae - MIC*    AMPICILLIN  >=32 RESISTANT Resistant     CEFAZOLIN  <=4 SENSITIVE Sensitive     CEFEPIME  <=0.12 SENSITIVE Sensitive     CEFTRIAXONE <=0.25 SENSITIVE Sensitive     CIPROFLOXACIN <=0.25 SENSITIVE Sensitive     GENTAMICIN  <=1 SENSITIVE Sensitive     IMIPENEM <=0.25 SENSITIVE Sensitive     NITROFURANTOIN 64 INTERMEDIATE Intermediate     TRIMETH/SULFA <=20 SENSITIVE Sensitive     AMPICILLIN /SULBACTAM >=32 RESISTANT Resistant     PIP/TAZO 8 SENSITIVE Sensitive ug/mL   Klebsiella pneumoniae - MIC*    AMPICILLIN  >=32 RESISTANT Resistant     CEFAZOLIN  <=4 SENSITIVE Sensitive     CEFEPIME  <=0.12 SENSITIVE Sensitive     CEFTRIAXONE <=0.25 SENSITIVE Sensitive     CIPROFLOXACIN <=0.25 SENSITIVE Sensitive     GENTAMICIN  <=1 SENSITIVE Sensitive     IMIPENEM <=0.25 SENSITIVE Sensitive     NITROFURANTOIN <=16 SENSITIVE Sensitive     TRIMETH/SULFA <=20 SENSITIVE Sensitive     AMPICILLIN /SULBACTAM >=32 RESISTANT Resistant     PIP/TAZO 16 SENSITIVE Sensitive ug/mL    * 70,000 COLONIES/mL KLEBSIELLA PNEUMONIAE    50,000 COLONIES/mL KLEBSIELLA PNEUMONIAE  Culture, blood (Routine X 2) w Reflex to ID Panel     Status: None (Preliminary result)   Collection Time: 04/17/23  6:55 AM   Specimen: BLOOD RIGHT HAND  Result Value Ref Range Status   Specimen Description BLOOD RIGHT HAND  Final   Special Requests   Final    BOTTLES DRAWN  AEROBIC AND ANAEROBIC Blood Culture results may not be optimal due to an inadequate volume of blood received in culture bottles   Culture   Final    NO GROWTH 2 DAYS Performed at [facility], [address] [place], [place] [address]    Report Status PENDING  Incomplete  Culture, blood (Routine X 2) w Reflex to ID Panel     Status: None (Preliminary result)   Collection Time: 04/17/23  6:59 AM   Specimen: BLOOD RIGHT ARM  Result Value Ref Range Status   Specimen Description BLOOD RIGHT ARM  Final   Special Requests   Final    BOTTLES DRAWN AEROBIC AND ANAEROBIC Blood Culture results may not be optimal due to an inadequate volume of blood received in culture bottles   Culture   Final    NO GROWTH 2 DAYS Performed at [facility], [address] [place], [place] [address]    Report Status PENDING  Incomplete  Surgical pcr screen     Status: Abnormal   Collection Time: 04/18/23  4:53 AM   Specimen: Nasal Mucosa; Nasal Swab  Result Value Ref Range Status   MRSA, PCR NEGATIVE NEGATIVE Final   Staphylococcus aureus POSITIVE (A) NEGATIVE Final    Comment: (NOTE) The Xpert SA Assay (FDA approved for NASAL specimens in patients 59 years of age and older), is one component of a comprehensive surveillance program. It is not intended to diagnose infection nor to guide or monitor treatment. Performed at [facility], [address], [place], [place] [address]   Aerobic/Anaerobic Culture w Gram Stain (surgical/deep wound)     Status: None (Preliminary result)   Collection Time: 04/18/23  8:07 AM   Specimen: PATH Digit amputation; Tissue  Result Value Ref Range Status   Specimen Description TISSUE  Final   Special Requests left 2nd toe  Final   Gram Stain   Final    RARE WBC PRESENT, PREDOMINANTLY PMN RARE GRAM POSITIVE COCCI IN PAIRS Performed at [facility], [address] [place], [place] [address]    Culture FEW STAPHYLOCOCCUS AUREUS  Final   Report  Status PENDING  Incomplete     Corean Fireman, MSN, NP-C [facility]  [email or web address] Pager: [phone] Office: [phone] [facility] Main Line: [phone] *Secure Chat Communication Welcome

## 2023-04-19 NOTE — Progress Notes (Signed)
 Pt back from cath lab, alert and verbally responsive, follow command, VSS. Telemetry confirmed with CCMD, pt in bed with call light within reach, bed alarm on. MYRTIS Val Roof RN   04/19/23 0935  Vitals  Temp 97.9 F (36.6 C)  Temp Source Oral  BP 133/63  MAP (mmHg) 84  BP Location Left Wrist  BP Method Automatic  Patient Position (if appropriate) Lying  Pulse Rate 74  Pulse Rate Source Monitor  ECG Heart Rate 77  Resp 18  MEWS COLOR  MEWS Score Color Green  Oxygen Therapy  SpO2 95 %  O2 Device Room Air  MEWS Score  MEWS Temp 0  MEWS Systolic 0  MEWS Pulse 0  MEWS RR 0  MEWS LOC 0  MEWS Score 0

## 2023-04-19 NOTE — Progress Notes (Signed)
 Echocardiogram Echocardiogram Transesophageal has been performed.  Adam Ponce 04/19/2023, 8:53 AM

## 2023-04-19 NOTE — Inpatient Diabetes Management (Signed)
 Inpatient Diabetes Program Recommendations  AACE/ADA: New Consensus Statement on Inpatient Glycemic Control   Target Ranges:  Prepandial:   less than 140 mg/dL      Peak postprandial:   less than 180 mg/dL (1-2 hours)      Critically ill patients:  140 - 180 mg/dL    Latest Reference Range & Units 04/18/23 07:25 04/18/23 08:14 04/18/23 11:21 04/18/23 16:57 04/18/23 21:16 04/18/23 23:54 04/19/23 03:39  Glucose-Capillary 70 - 99 mg/dL 751 (H) 762 (H) 716 (H) 292 (H) 262 (H) 268 (H) 278 (H)   Review of Glycemic Control  Diabetes history: DM2 Outpatient Diabetes medications: Lantus  16 units daily, Metformin 500 mg BID, Glipizide 10 mg daily, Ozempic 2 mg Qweek Current orders for Inpatient glycemic control: Semglee  10 units at bedtime, Novolog  0-9 units Q4H   Inpatient Diabetes Program Recommendations:     Insulin : Please consider increasing Semglee  to 15 units at bedtime. Once diet resumed, please consider ordering Novolog  3 units TID with meals for meal coverage if patient eats at least 50% of meals.   Thanks, Earnie Gainer, RN, MSN, CDCES Diabetes Coordinator Inpatient Diabetes Program [phone] (Team Pager from 8am to 5pm)

## 2023-04-19 NOTE — TOC Progression Note (Signed)
 Transition of Care [facility]) - Progression Note    Patient Details  Name: Adam Ponce MRN: [id number] Date of Birth: 1950/06/04  Transition of [facility]) CM/SW Contact  Luann SHAUNNA [place], [place] Phone Number: 04/19/2023, 4:12 PM  Clinical Narrative:     SNF auth request submitted in online portal; Auth PI#[id number]  SNF auth is currently pending  Expected Discharge Plan: Skilled Nursing Facility Barriers to Discharge: Continued Medical Work up  Expected Discharge Plan and Services In-house Referral: Clinical Social Work     Living arrangements for the past 2 months: Apartment                                       Social Determinants of Health (SDOH) Interventions SDOH Screenings   Food Insecurity: Food Insecurity Present (04/15/2023)  Housing: High Risk (04/15/2023)  Transportation Needs: Unmet Transportation Needs (04/15/2023)  Utilities: At Risk (04/15/2023)  Depression (PHQ2-9): Low Risk  (03/20/2020)  Recent Concern: Depression (PHQ2-9) - Medium Risk (01/10/2020)  Social Connections: Patient Declined (04/18/2023)  Tobacco Use: Medium Risk (04/18/2023)    Readmission Risk Interventions     No data to display

## 2023-04-19 NOTE — TOC Progression Note (Signed)
 Transition of Care [facility]) - Progression Note    Patient Details  Name: Adam Ponce MRN: [id number] Date of Birth: January 13, 1951  Transition of Care [facility]) CM/SW Contact  Montie LOISE [place]der, [place] Phone Number: 04/19/2023, 2:54 PM  Clinical Narrative:     CSW met with patient at bedside. CSW introduced self and explained role. CSW verbally informed patient of bed offers and medicare.gov ratings. Patient preferred SNF is [facility] or [facility].  2:56pm - CSW sent message to [facility]- waiting on response   Montie Louder, MSW, LCSW Clinical Social Worker     Expected Discharge Plan: Skilled Nursing Facility Barriers to Discharge: Continued Medical Work up  Expected Discharge Plan and Services In-house Referral: Clinical Social Work     Living arrangements for the past 2 months: Apartment                                       Social Determinants of Health (SDOH) Interventions SDOH Screenings   Food Insecurity: Food Insecurity Present (04/15/2023)  Housing: High Risk (04/15/2023)  Transportation Needs: Unmet Transportation Needs (04/15/2023)  Utilities: At Risk (04/15/2023)  Depression (PHQ2-9): Low Risk  (03/20/2020)  Recent Concern: Depression (PHQ2-9) - Medium Risk (01/10/2020)  Social Connections: Patient Declined (04/18/2023)  Tobacco Use: Medium Risk (04/18/2023)    Readmission Risk Interventions     No data to display

## 2023-04-19 NOTE — Progress Notes (Signed)
Pt off the unit to cath lab.

## 2023-04-19 NOTE — Progress Notes (Addendum)
 Physical Therapy Treatment Patient Details Name: Adam Ponce MRN: [id number] DOB: 1950-09-21 Today's Date: 04/19/2023   History of Present Illness 72 y.o. male presents to [facility] hospital on 04/14/2023 after a fall. Pt admitted with concern for L foot infection and rhabdomyolysis. He underwent L 2nd toe amp 04/18/23. PMH includes a flutter s/p PPM, DMII, toe amputation, HIV, HTN, HLD, GERD, COPD, CKD III, OSA, BPH.    PT Comments  Pt underwent L 2nd toe amp 12/30. He is now PWB LLE, heel WB for transfers only in post op shoe. He required min assist rolling, max assist sup<>sit, and max assist lateral scooting EOB. Pt with primary c/o R hip and knee pain. He reports need for TKA. Pt returned to bed at end of session. Current POC remains appropriate.     If plan is discharge home, recommend the following: Two people to help with walking and/or transfers;Two people to help with bathing/dressing/bathroom;Assistance with cooking/housework;Assist for transportation;Help with stairs or ramp for entrance   Can travel by private vehicle     No  Equipment Recommendations  Other (comment) (TBD, pending progress)    Recommendations for Other Services       Precautions / Restrictions Precautions Precautions: Fall Required Braces or Orthoses: Other Brace Other Brace: post op shoe Restrictions Weight Bearing Restrictions Per Provider Order: Yes LLE Weight Bearing Per Provider Order: Partial weight bearing LLE Partial Weight Bearing Percentage or Pounds: heel WB in post op shoe for transfers only     Mobility  Bed Mobility Overal bed mobility: Needs Assistance Bed Mobility: Rolling, Supine to Sit, Sit to Supine Rolling: Min assist   Supine to sit: Max assist, HOB elevated, Used rails Sit to supine: Max assist, Used rails   General bed mobility comments: increased time, assist for trunk and BLE, limited by pain R hip and knee    Transfers Overall transfer level: Needs assistance Equipment  used: None               General transfer comment: lateral scoots along EOB max assist using bed pad    Ambulation/Gait                   Stairs             Wheelchair Mobility     Tilt Bed    Modified Rankin (Stroke Patients Only)       Balance Overall balance assessment: Needs assistance Sitting-balance support: No upper extremity supported, Feet supported Sitting balance-Leahy Scale: Fair                                      Cognition Arousal: Alert Behavior During Therapy: WFL for tasks assessed/performed Overall Cognitive Status: Within Functional Limits for tasks assessed                                          Exercises      General Comments General comments (skin integrity, edema, etc.): VSS on RA. Pt underwent TEE this AM. Pt with c/o dizziness EOB.      Pertinent Vitals/Pain Pain Assessment Pain Assessment: Faces Faces Pain Scale: Hurts whole lot Pain Location: RLE with mobility Pain Descriptors / Indicators: Discomfort, Grimacing, Guarding, Moaning Pain Intervention(s): Monitored during session, Limited activity within patient's tolerance, Repositioned  Home Living                          Prior Function            PT Goals (current goals can now be found in the care plan section) Acute Rehab PT Goals Patient Stated Goal: to walk Progress towards PT goals: Progressing toward goals    Frequency    Min 1X/week      PT Plan      Co-evaluation              AM-PAC PT 6 Clicks Mobility   Outcome Measure  Help needed turning from your back to your side while in a flat bed without using bedrails?: A Lot Help needed moving from lying on your back to sitting on the side of a flat bed without using bedrails?: Total Help needed moving to and from a bed to a chair (including a wheelchair)?: Total Help needed standing up from a chair using your arms (e.g., wheelchair or  bedside chair)?: Total Help needed to walk in hospital room?: Total Help needed climbing 3-5 steps with a railing? : Total 6 Click Score: 7    End of Session   Activity Tolerance: Patient limited by pain Patient left: in bed;with call bell/phone within reach Nurse Communication: Mobility status PT Visit Diagnosis: Other abnormalities of gait and mobility (R26.89);Muscle weakness (generalized) (M62.81)     Time: 8898-8874 PT Time Calculation (min) (ACUTE ONLY): 24 min  Charges:    $Therapeutic Activity: 23-37 mins PT General Charges $$ ACUTE PT VISIT: 1 Visit                     Sari MATSU., PT  Office # [phone]    Erven Sari Shaker 04/19/2023, 11:49 AM

## 2023-04-19 NOTE — Interval H&P Note (Signed)
 History and Physical Interval Note:  04/19/2023 8:27 AM  Adam Ponce  has presented today for surgery, with the diagnosis of bacteremia.  The various methods of treatment have been discussed with the patient and family. After consideration of risks, benefits and other options for treatment, the patient has consented to  Procedure(s): TRANSESOPHAGEAL ECHOCARDIOGRAM (N/A) as a surgical intervention.  The patient's history has been reviewed, patient examined, no change in status, stable for surgery.  I have reviewed the patient's chart and labs.  Questions were answered to the patient's satisfaction.     Adam Ponce

## 2023-04-19 NOTE — Consult Note (Signed)
 ELECTROPHYSIOLOGY CONSULT NOTE    Patient ID: Adam Ponce MRN: [id number], DOB/AGE: 72-Jan-1952 72 y.o.  Admit date: 04/14/2023 Date of Consult: 04/19/2023  Primary Physician: System, Provider Not In Primary Cardiologist: None  Electrophysiologist: [facility]    Referring Provider: Dr. Trixie  Patient Profile: Adam Ponce is a 72 y.o. 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 Trixie.  HPI:  Adam Ponce is a 72 y.o. male with PMH as above who presented to ER 12/26 after unwitnessed fall, was found on floor by staff at his facility. His prior L toe amputation site has been draining for the past 3 weeks with increased pain while walking. In ER, he was afibrile with elevated WBC to 15.3, CRP 18.4, CK >2k, Cr 2.34. UA w small leukocytes, many bacteria. Code sepsis was activated.   Urine culture has since grown two different klebsiella pneumoniae with different sensitivities, so thought to be contamination.  blood cultures 12/27 with staph aureus.   L Foot MRI with osteomyelitis and abscess, s/p amputation of remaining 2nd toe at MPJ level of left foot.  He underwent TEE 12/31 to eval valves and leads given positive blood cultures. It showed no endocarditis, no vegetation or thrombus on PPM wires, mildly reduced LVEF with global hypokinesis.   He says that he does not feel like himself currently, usually has much more energy. He says his PPM was implanted 2-3 years ago so that he could come off blood thinners. He is not aware of the PPM manufacturer company, has a bedside monitor that he uses. His main complaint currently is R knee pain, says he needs a knee replacement but is not a candidate d/t being too risky.   Labs Potassium4.2 (12/30 0431)    Creatinine, ser  1.45* (12/30 0431) PLT  227 (12/30 0431) HGB  13.3 (12/30 0431) WBC 10.3 (12/30 0431)  .    Past Medical History:  Diagnosis Date   Arthritis    Asthma    Constipation    Diabetes mellitus    Heart disease    HIV disease (HCC) 04/19/1993   Hypertension    Presence of permanent cardiac pacemaker      Surgical History:  Past Surgical History:  Procedure Laterality Date   AMPUTATION Left 04/18/2023   Procedure: LEFT SECOND AMPUTATION RAY;  Surgeon: Malvin Marsa FALCON, DPM;  Location: [facility] OR;  Service: Orthopedics/Podiatry;  Laterality: Left;   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   JOINT REPLACEMENT  2010   Bilateral hip     Medications Prior to Admission  Medication Sig Dispense Refill Last Dose/Taking   albuterol  (PROVENTIL  HFA;VENTOLIN  HFA) 108 (90 Base) MCG/ACT inhaler Inhale into the lungs every 6 (six) hours as needed for wheezing or shortness of breath.   Taking As Needed   Azelastine  HCl 137 MCG/SPRAY SOLN Place 2 sprays into the nose as needed.   Past Week   bacitracin 500 UNIT/GM ointment Apply 1 Application topically 2 (two) times daily. Small amount   Past Month   betamethasone valerate (VALISONE) 0.1 % cream Apply topically 2 (two) times daily.   04/14/2023 Evening   Cholecalciferol (VITAMIN D3) 10000 units TABS Take by mouth.   Past Week   cyanocobalamin (,VITAMIN B-12,) 1000 MCG/ML injection Inject  1,000 mcg into the muscle once.   Past Month   darunavir  (PREZISTA ) 800 MG tablet Take 800 mg by mouth.   04/14/2023 Morning   darunavir -cobicistat  (PREZCOBIX ) 800-150 MG tablet Take by mouth.   04/14/2023 Morning   docusate sodium  (COLACE) 100 MG capsule Take 100 mg by mouth 2 (two) times daily.   04/14/2023 Evening   emtricitabine -tenofovir  AF (DESCOVY ) 200-25 MG tablet Take 1 tablet by mouth daily.   04/14/2023 Morning   ezetimibe (ZETIA) 10 MG tablet Take 10 mg by mouth daily.   04/14/2023 Morning   famotidine (PEPCID) 20 MG tablet Take 20 mg by  mouth 2 (two) times daily.   04/14/2023 Evening   fenofibrate (TRICOR) 145 MG tablet Take 145 mg by mouth daily.   04/14/2023 Morning   glipiZIDE (GLUCOTROL) 10 MG tablet Take 10 mg by mouth daily before breakfast.   04/14/2023 Morning   guaiFENesin (MUCINEX) 600 MG 12 hr tablet Take 600 mg by mouth every 12 (twelve) hours as needed for cough (congestion).   Past Month   hydrocerin (EUCERIN) CREA Apply 1 Application topically as needed.   04/14/2023 Evening   INSULIN  GLARGINE [place] Inject 16 Units into the skin at bedtime.   Past Week   ipratropium (ATROVENT) 0.06 % nasal spray Place 2 sprays into both nostrils 3 (three) times daily as needed for rhinitis.   Past Week   metFORMIN (GLUCOPHAGE) 500 MG tablet Take 500 mg by mouth 2 (two) times daily with a meal.   04/14/2023 Morning   pravastatin (PRAVACHOL) 80 MG tablet Take 80 mg by mouth daily.   04/14/2023 Morning   pregabalin  (LYRICA ) 100 MG capsule Take 100 mg by mouth 2 (two) times daily.   04/14/2023 Morning   ritonavir (NORVIR) 100 MG TABS tablet Take by mouth.   04/14/2023 Morning   Semaglutide, 2 MG/DOSE, 8 MG/3ML SOPN Inject 2 mg into the skin once a week. Inject on Thursday   Taking   sennosides-docusate sodium  (SENOKOT-S) 8.6-50 MG tablet Take 1 tablet by mouth daily.   04/14/2023   sodium chloride  (OCEAN) 0.65 % nasal spray Place 1 spray into the nose 4 (four) times daily as needed for congestion.   04/14/2023   terazosin (HYTRIN) 2 MG capsule Take 1 capsule by mouth at bedtime.   Past Week   traMADol  (ULTRAM ) 50 MG tablet Take by mouth every 6 (six) hours as needed.   04/14/2023   triamcinolone cream (KENALOG) 0.1 % Apply 1 Application topically daily as needed (itching on ears).   Past Month   valACYclovir (VALTREX) 1000 MG tablet Take 1,000 mg by mouth in the morning and at bedtime. As needed for 5 days (for outbreaks)   Past Month   benzonatate (TESSALON) 100 MG capsule Take 100 mg by mouth every 8 (eight) hours as needed for cough.  (Patient not taking: Reported on 04/15/2023)   Not Taking    Inpatient Medications:   acetaminophen   1,000 mg Oral QID   azelastine   2 spray Each Nare BID   Chlorhexidine  Gluconate Cloth  6 each Topical Daily   darunavir -cobicistat   1 tablet Oral Q breakfast   emtricitabine -tenofovir  AF  1 tablet Oral Daily   enoxaparin  (LOVENOX ) injection  60 mg Subcutaneous QHS   insulin  aspart  0-9 Units Subcutaneous Q4H   insulin  aspart  3 Units Subcutaneous TID WC   insulin  glargine-yfgn  15 Units Subcutaneous QHS   pantoprazole   40 mg Oral Daily   sodium chloride  flush  10-40  mL Intracatheter Q12H    Allergies:  Allergies  Allergen Reactions   Ciprofloxacin Other (See Comments)    unknown   Ciprofloxacin-Dexamethasone  Other (See Comments)    unknown   Codeine     Family History  Problem Relation Age of Onset   Heart attack Father      Physical Exam: Vitals:   04/19/23 0907 04/19/23 0917 04/19/23 0935 04/19/23 1147  BP: 103/65 112/61 133/63 125/65  Pulse: 81 80 74 72  Resp: 19 18 18 18   Temp:   97.9 F (36.6 C) 98.7 F (37.1 C)  TempSrc:   Oral Axillary  SpO2: 97% 94% 95% 92%  Weight:      Height:        GEN- NAD, A&O x 3, nods off frequently during lulls in conversation HEENT: Normocephalic, atraumatic, poor dentition  Lungs- CTAB, Normal effort.  Heart- Regular rate and rhythm, No M/G/R.  GI- Soft, NT, ND.  Extremities- No clubbing, cyanosis, or edema   Radiology/Studies: US  EKG SITE RITE Result Date: 04/19/2023 If Site Rite image not attached, placement could not be confirmed due to current cardiac rhythm.  ECHO TEE Result Date: 04/19/2023    TRANSESOPHOGEAL ECHO REPORT   Patient Name:   Adam Ponce Date of Exam: 04/19/2023 Medical Rec #:  [id number]     Height:       71.0 in Accession #:    [id number]    Weight:       277.8 lb Date of Birth:  Nov 07, 1950      BSA:          2.424 m Patient Age:    72 years      BP:           130/57 mmHg Patient Gender: M              HR:           83 bpm. Exam Location:  Inpatient Procedure: Transesophageal Echo, Cardiac Doppler and Color Doppler Indications:     Bacteremia  History:         Patient has prior history of Echocardiogram examinations, most                  recent 04/15/2023. CHF, Pacemaker, Signs/Symptoms:Syncope; Risk                  Factors:Hypertension and Diabetes.  Sonographer:     Thea Norlander RCS Referring Phys:  [id number] HAO MENG Diagnosing Phys: Vinie Maxcy MD PROCEDURE: After discussion of the risks and benefits of a TEE, an informed consent was obtained from the patient. The transesophogeal probe was passed without difficulty through the esophogus of the patient. Imaged were obtained with the patient in a supine position. Sedation performed by different physician. The patient was monitored while under deep sedation. Anesthestetic sedation was provided intravenously by Anesthesiology: 220.8mg  of Propofol , 100mg  of Lidocaine . The patient developed no complications during the procedure.  IMPRESSIONS  1. Left ventricular ejection fraction, by estimation, is 45 to 50%. The left ventricle has mildly decreased function. The left ventricle demonstrates global hypokinesis. There is mild left ventricular hypertrophy.  2. Right ventricular systolic function is normal. The right ventricular size is normal.  3. No left atrial/left atrial appendage thrombus was detected.  4. Right atrial size was mildly dilated.  5. The mitral valve is grossly normal. Trivial mitral valve regurgitation.  6. The aortic valve is tricuspid. Aortic valve regurgitation is not visualized.  7. Agitated  saline contrast bubble study was negative, with no evidence of any interatrial shunt. Conclusion(s)/Recommendation(s): No evidence of vegetation/infective endocarditis on this transesophageael echocardiogram. FINDINGS  Left Ventricle: Left ventricular ejection fraction, by estimation, is 45 to 50%. The left ventricle has mildly decreased function. The  left ventricle demonstrates global hypokinesis. The left ventricular internal cavity size was normal in size. There is  mild left ventricular hypertrophy. Right Ventricle: The right ventricular size is normal. No increase in right ventricular wall thickness. Right ventricular systolic function is normal. Left Atrium: Left atrial size was normal in size. No left atrial/left atrial appendage thrombus was detected. Right Atrium: Right atrial size was mildly dilated. Pericardium: Trivial pericardial effusion is present. The pericardial effusion is posterior to the left ventricle. Mitral Valve: The mitral valve is grossly normal. Trivial mitral valve regurgitation. Tricuspid Valve: The tricuspid valve is grossly normal. Tricuspid valve regurgitation is mild. Aortic Valve: The aortic valve is tricuspid. Aortic valve regurgitation is not visualized. Pulmonic Valve: The pulmonic valve was grossly normal. Pulmonic valve regurgitation is trivial. Aorta: The aortic root and ascending aorta are structurally normal, with no evidence of dilitation. IAS/Shunts: No atrial level shunt detected by color flow Doppler. Agitated saline contrast bubble study was negative, with no evidence of any interatrial shunt. Additional Comments: A device lead is visualized. Spectral Doppler performed.  AORTA Ao Asc diam: 3.40 cm TRICUSPID VALVE TR Peak grad:   30.2 mmHg TR Vmax:        275.00 cm/s Vinie Maxcy MD Electronically signed by Vinie Maxcy MD Signature Date/Time: 04/19/2023/10:02:01 AM    Final    EP STUDY Result Date: 04/19/2023 See surgical note for result.  DG Foot 2 Views Left Result Date: 04/18/2023 CLINICAL DATA:  252351 Post-operative state 252351, left second toe amputation EXAM: LEFT FOOT - 2 VIEW COMPARISON:  04/15/2023 left second toe radiographs FINDINGS: Interval amputation of the remaining left second toe with mild soft tissue swelling at the amputation site. No acute osseous fractures. No dislocation. Lisfranc  joint appears intact. No osseous erosions or periosteal reaction. No appreciable soft tissue gas. No radiopaque foreign bodies. No focal osseous lesions. Prominent dorsal marginal osteophytes throughout the tarsal joints. Mild first MTP joint osteoarthritis. Vascular calcifications in the soft tissues. IMPRESSION: 1. Interval amputation of the remaining left second toe with mild soft tissue swelling at the amputation site. No acute osseous abnormality. 2. Prominent dorsal marginal osteophytes throughout the tarsal joints, suggesting hallux rigidus. 3. Mild first MTP joint osteoarthritis. Electronically Signed   By: Selinda DELENA Blue M.D.   On: 04/18/2023 12:29   VAS US  ABI WITH/WO TBI Result Date: 04/17/2023  LOWER EXTREMITY DOPPLER STUDY Patient Name:  Adam Ponce  Date of Exam:   04/16/2023 Medical Rec #: [id number]      Accession #:    [id number] Date of Birth: 1951-01-27       Patient Gender: M Patient Age:   63 years Exam Location:  [facility] Procedure:      VAS US  ABI WITH/WO TBI Referring Phys: JESSICA VANN --------------------------------------------------------------------------------  Indications: Ulceration. Pain. High Risk Factors: Hypertension, hyperlipidemia, Diabetes.  Comparison Study: No prior exam. Performing Technologist: Edilia Elden Appl  Examination Guidelines: A complete evaluation includes at minimum, Doppler waveform signals and systolic blood pressure reading at the level of bilateral brachial, anterior tibial, and posterior tibial arteries, when vessel segments are accessible. Bilateral testing is considered an integral part of a complete examination. Photoelectric Plethysmograph (PPG) waveforms and toe systolic pressure readings  are included as required and additional duplex testing as needed. Limited examinations for reoccurring indications may be performed as noted.  ABI Findings: +---------+------------------+-----+---------+--------+ Right    Rt Pressure  (mmHg)IndexWaveform Comment  +---------+------------------+-----+---------+--------+ Brachial 144                    triphasic         +---------+------------------+-----+---------+--------+ PTA      173               1.20 triphasic         +---------+------------------+-----+---------+--------+ DP       178               1.24 triphasic         +---------+------------------+-----+---------+--------+ [facility]               1.62 Abnormal          +---------+------------------+-----+---------+--------+ +---------+------------------+-----+---------+-------+ Left     Lt Pressure (mmHg)IndexWaveform Comment +---------+------------------+-----+---------+-------+ Brachial 143                    triphasic        +---------+------------------+-----+---------+-------+ PTA      166               1.15 triphasic        +---------+------------------+-----+---------+-------+ DP       178               1.24 triphasic        +---------+------------------+-----+---------+-------+ Great Toe128               0.89 Normal           +---------+------------------+-----+---------+-------+  Summary: Right: Resting right ankle-brachial index is within normal range. The right toe-brachial index is abnormal. Left: Resting left ankle-brachial index is within normal range. The left toe-brachial index is normal. *See table(s) above for measurements and observations.  Electronically signed by Debby Robertson on 04/17/2023 at 12:32:02 PM.    Final    MR FOOT LEFT WO CONTRAST Result Date: 04/15/2023 CLINICAL DATA:  Foot swelling, diabetes. Left second toe amputation 2 years ago. EXAM: MRI OF THE LEFT FOOT WITHOUT CONTRAST TECHNIQUE: Multiplanar, multisequence MR imaging of the left foot was performed. No intravenous contrast was administered. COMPARISON:  Radiographs 04/15/2023 FINDINGS: Bones/Joint/Cartilage Prior amputation of the second toe at the mid proximal phalangeal level. There  is abnormal edema in the remaining proximal phalanx of the second toe compatible with osteomyelitis. Substantial degenerative arthropathy in the midfoot and along the Lisfranc joint. Ligaments Lisfranc ligament intact. Muscles and Tendons Muscular atrophy in the forefoot. Soft tissues Along the dorsal lateral margin of the remaining proximal phalanx of the second toe, a 9 by 9 by 8 mm (volume = 300 mm^3) fluid signal intensity collection is present, probably a small abscess. Surrounding inflammatory findings compatible with cellulitis. Mild intermetatarsal bursitis between the heads of the first and second, and second and third metatarsals. Dorsal subcutaneous edema in the forefoot, cellulitis not excluded. IMPRESSION: 1. 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 2. 9 mm in diameter abscess along the dorsal-lateral margin of the remaining proximal phalanx second toe with surrounding cellulitis. 3. Dorsal subcutaneous edema in the forefoot, cellulitis not excluded. 4. Mild intermetatarsal bursitis between the heads of the first and second, and second and third metatarsals. 5. Substantial degenerative arthropathy in the midfoot and along the Lisfranc joint. 6. Muscular atrophy in the forefoot. Electronically Signed   By:  Ryan Salvage M.D.   On: 04/15/2023 16:11   ECHOCARDIOGRAM COMPLETE Result Date: 04/15/2023    ECHOCARDIOGRAM REPORT   Patient Name:   Adam Ponce Date of Exam: 04/15/2023 Medical Rec #:  [id number]     Height:       71.0 in Accession #:    [id number]    Weight:       277.8 lb Date of Birth:  09-17-1950      BSA:          2.424 m Patient Age:    72 years      BP:           126/81 mmHg Patient Gender: M             HR:           64 bpm. Exam Location:  Inpatient Procedure: 2D Echo, Cardiac Doppler, Color Doppler and Intracardiac            Opacification Agent Indications:    Syncope  History:        Patient has no prior history of Echocardiogram examinations.                  CHF; Risk Factors:Diabetes and Hypertension.  Sonographer:    Juanita Shaw Referring Phys: [id number] RATHORE  Sonographer Comments: Image acquisition challenging due to patient body habitus. IMPRESSIONS  1. Left ventricular ejection fraction, by estimation, is 50 to 55%. The left ventricle has low normal function. The left ventricle has no regional wall motion abnormalities. Left ventricular diastolic parameters were normal.  2. Right ventricular systolic function is normal. The right ventricular size is normal. There is moderately elevated pulmonary artery systolic pressure.  3. The mitral valve is normal in structure. Trivial mitral valve regurgitation. No evidence of mitral stenosis.  4. The aortic valve is tricuspid. Aortic valve regurgitation is not visualized. No aortic stenosis is present. Comparison(s): No prior Echocardiogram. FINDINGS  Left Ventricle: Left ventricular ejection fraction, by estimation, is 50 to 55%. The left ventricle has low normal function. The left ventricle has no regional wall motion abnormalities. Definity  contrast agent was given IV to delineate the left ventricular endocardial borders. The left ventricular internal cavity size was normal in size. There is no left ventricular hypertrophy. Left ventricular diastolic parameters were normal. Right Ventricle: The right ventricular size is normal. Right ventricular systolic function is normal. There is moderately elevated pulmonary artery systolic pressure. The tricuspid regurgitant velocity is 3.56 m/s, and with an assumed right atrial pressure of 3 mmHg, the estimated right ventricular systolic pressure is 53.7 mmHg. Left Atrium: Left atrial size was normal in size. Right Atrium: Right atrial size was normal in size. Pericardium: There is no evidence of pericardial effusion. Mitral Valve: The mitral valve is normal in structure. Trivial mitral valve regurgitation. No evidence of mitral valve stenosis. MV peak gradient, 4.7 mmHg.  The mean mitral valve gradient is 1.5 mmHg. Tricuspid Valve: The tricuspid valve is normal in structure. Tricuspid valve regurgitation is mild . No evidence of tricuspid stenosis. Aortic Valve: The aortic valve is tricuspid. Aortic valve regurgitation is not visualized. No aortic stenosis is present. Aortic valve mean gradient measures 3.8 mmHg. Aortic valve peak gradient measures 7.0 mmHg. Aortic valve area, by VTI measures 1.97 cm. Pulmonic Valve: The pulmonic valve was normal in structure. Pulmonic valve regurgitation is mild. No evidence of pulmonic stenosis. Aorta: The aortic root is normal in size and structure. Venous: The inferior vena  cava was not well visualized. IAS/Shunts: The interatrial septum was not well visualized.  LEFT VENTRICLE PLAX 2D LVIDd:         5.20 cm      Diastology LVIDs:         3.50 cm      LV e' medial:    8.27 cm/s LV PW:         0.90 cm      LV E/e' medial:  10.7 LV IVS:        0.90 cm      LV e' lateral:   10.23 cm/s LVOT diam:     1.90 cm      LV E/e' lateral: 8.7 LV SV:         52 LV SV Index:   21 LVOT Area:     2.84 cm  LV Volumes (MOD) LV vol d, MOD A2C: 185.0 ml LV vol d, MOD A4C: 200.0 ml LV vol s, MOD A2C: 76.6 ml LV vol s, MOD A4C: 85.4 ml LV SV MOD A2C:     108.4 ml LV SV MOD A4C:     200.0 ml LV SV MOD BP:      107.3 ml RIGHT VENTRICLE RV Basal diam:  4.00 cm RV Mid diam:    3.10 cm RV S prime:     13.50 cm/s TAPSE (M-mode): 2.4 cm LEFT ATRIUM           Index        RIGHT ATRIUM           Index LA diam:      3.80 cm 1.57 cm/m   RA Area:     19.80 cm LA Vol (A2C): 71.9 ml 29.66 ml/m  RA Volume:   50.80 ml  20.95 ml/m LA Vol (A4C): 45.4 ml 18.73 ml/m  AORTIC VALVE                    PULMONIC VALVE AV Area (Vmax):    2.26 cm     PV Vmax:          1.04 m/s AV Area (Vmean):   2.22 cm     PV Peak grad:     4.3 mmHg AV Area (VTI):     1.97 cm     PR End Diast Vel: 2.90 msec AV Vmax:           132.25 cm/s AV Vmean:          84.950 cm/s AV VTI:            0.261 m AV  Peak Grad:      7.0 mmHg AV Mean Grad:      3.8 mmHg LVOT Vmax:         105.20 cm/s LVOT Vmean:        66.400 cm/s LVOT VTI:          0.182 m LVOT/AV VTI ratio: 0.70  AORTA Ao Root diam: 3.30 cm Ao Asc diam:  2.80 cm MITRAL VALVE               TRICUSPID VALVE MV Area (PHT): 4.99 cm    TR Peak grad:   50.7 mmHg MV Area VTI:   1.91 cm    TR Vmax:        356.00 cm/s MV Peak grad:  4.7 mmHg MV Mean grad:  1.5 mmHg    SHUNTS MV Vmax:       1.09 m/s  Systemic VTI:  0.18 m MV Vmean:      60.8 cm/s   Systemic Diam: 1.90 cm MV Decel Time: 152 msec MV E velocity: 88.80 cm/s MV A velocity: 97.90 cm/s MV E/A ratio:  0.91 Redell Shallow MD Electronically signed by Redell Shallow MD Signature Date/Time: 04/15/2023/1:03:34 PM    Final    DG Toe 2nd Left Result Date: 04/15/2023 CLINICAL DATA:  Evaluate for osteomyelitis.  Fall. EXAM: LEFT SECOND TOE COMPARISON:  None Available. 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 No acute bony abnormality. Specifically, no fracture, subluxation, or dislocation. No bone destruction to suggest osteomyelitis. Degenerative changes in the midfoot. IMPRESSION: No acute bony abnormality. Electronically Signed   By: Franky Crease M.D.   On: 04/15/2023 00:51   DG Chest Port 1 View Result Date: 04/15/2023 CLINICAL DATA:  Questionable sepsis - evaluate for abnormality. Fall. EXAM: PORTABLE CHEST 1 VIEW COMPARISON:  05/13/2009 FINDINGS: Left pacer in place with leads in the right atrium and right ventricle. Cardiomegaly, vascular congestion. No confluent opacity, overt edema or effusions. No pneumothorax. No acute bony abnormality. IMPRESSION: Cardiomegaly, vascular congestion. Electronically Signed   By: Franky Crease M.D.   On: 04/15/2023 00:48   CT Head Wo Contrast Result Date: 04/14/2023 CLINICAL DATA:  Head trauma, minor (Age >= 65y); Neck trauma (Age >= 65y) EXAM: CT HEAD WITHOUT CONTRAST CT CERVICAL SPINE WITHOUT CONTRAST TECHNIQUE: Multidetector  CT imaging of the head and cervical spine was performed following the standard protocol without intravenous contrast. Multiplanar CT image reconstructions of the cervical spine were also generated. RADIATION DOSE REDUCTION: This exam was performed according to the departmental dose-optimization program which includes automated exposure control, adjustment of the mA and/or kV according to patient size and/or use of iterative reconstruction technique. COMPARISON:  None Available. FINDINGS: CT HEAD FINDINGS Brain: Patchy and confluent areas of decreased attenuation are noted throughout the deep and periventricular white matter of the cerebral hemispheres bilaterally, compatible with chronic microvascular ischemic disease. No evidence of large-territorial acute infarction. No parenchymal hemorrhage. No mass lesion. No extra-axial collection. No mass effect or midline shift. No hydrocephalus. Basilar cisterns are patent. Empty sella. Vascular: No hyperdense vessel. Atherosclerotic calcifications are present within the cavernous internal carotid arteries. Skull: No acute fracture or focal lesion. Sinuses/Orbits: Right mastoidectomy. Left mastoid effusion. Paranasal sinuses are clear. The orbits are unremarkable. Other: None. CT CERVICAL SPINE FINDINGS Alignment: Grade 1 anterolisthesis of C3 on C4 and C4 on C5. Skull base and vertebrae: Multilevel severe degenerative changes of the spine. Associated severe right C2-C3, left C3-C4, right C4-C5 osseous neural foraminal stenosis. No acute fracture. No aggressive appearing focal osseous lesion or focal pathologic process. Soft tissues and spinal canal: No prevertebral fluid or swelling. No visible canal hematoma. Upper chest: Unremarkable. Other: None. IMPRESSION: 1. No acute intracranial abnormality. 2. No acute displaced fracture or traumatic listhesis of the cervical spine. 3. Multilevel severe degenerative changes of the spine. Associated severe right C2-C3, left C3-C4,  right C4-C5 osseous neural foraminal stenosis. 4. Empty sella. Findings is often a normal anatomic variant but can be associated with idiopathic intracranial hypertension (pseudotumor cerebri). Electronically Signed   By: Morgane  Naveau M.D.   On: 04/14/2023 19:43   CT Cervical Spine Wo Contrast Result Date: 04/14/2023 CLINICAL DATA:  Head trauma, minor (Age >= 65y); Neck trauma (Age >= 65y) EXAM: CT HEAD WITHOUT CONTRAST CT CERVICAL SPINE WITHOUT CONTRAST TECHNIQUE: Multidetector CT imaging of the head and cervical spine  was performed following the standard protocol without intravenous contrast. Multiplanar CT image reconstructions of the cervical spine were also generated. RADIATION DOSE REDUCTION: This exam was performed according to the departmental dose-optimization program which includes automated exposure control, adjustment of the mA and/or kV according to patient size and/or use of iterative reconstruction technique. COMPARISON:  None Available. FINDINGS: CT HEAD FINDINGS Brain: Patchy and confluent areas of decreased attenuation are noted throughout the deep and periventricular white matter of the cerebral hemispheres bilaterally, compatible with chronic microvascular ischemic disease. No evidence of large-territorial acute infarction. No parenchymal hemorrhage. No mass lesion. No extra-axial collection. No mass effect or midline shift. No hydrocephalus. Basilar cisterns are patent. Empty sella. Vascular: No hyperdense vessel. Atherosclerotic calcifications are present within the cavernous internal carotid arteries. Skull: No acute fracture or focal lesion. Sinuses/Orbits: Right mastoidectomy. Left mastoid effusion. Paranasal sinuses are clear. The orbits are unremarkable. Other: None. CT CERVICAL SPINE FINDINGS Alignment: Grade 1 anterolisthesis of C3 on C4 and C4 on C5. Skull base and vertebrae: Multilevel severe degenerative changes of the spine. Associated severe right C2-C3, left C3-C4, right  C4-C5 osseous neural foraminal stenosis. No acute fracture. No aggressive appearing focal osseous lesion or focal pathologic process. Soft tissues and spinal canal: No prevertebral fluid or swelling. No visible canal hematoma. Upper chest: Unremarkable. Other: None. IMPRESSION: 1. No acute intracranial abnormality. 2. No acute displaced fracture or traumatic listhesis of the cervical spine. 3. Multilevel severe degenerative changes of the spine. Associated severe right C2-C3, left C3-C4, right C4-C5 osseous neural foraminal stenosis. 4. Empty sella. Findings is often a normal anatomic variant but can be associated with idiopathic intracranial hypertension (pseudotumor cerebri). Electronically Signed   By: Morgane  Naveau M.D.   On: 04/14/2023 19:43   DG Ankle Complete Left Result Date: 04/14/2023 CLINICAL DATA:  Knee gave way patient fell. EXAM: LEFT ANKLE COMPLETE - 3+ VIEW COMPARISON:  None Available. FINDINGS: Mild swelling about the left ankle. No acute fracture or dislocation. Degenerative changes about the midfoot. IMPRESSION: Mild swelling about the left ankle without acute fracture. Electronically Signed   By: Norman Gatlin M.D.   On: 04/14/2023 19:30   DG Knee Complete 4 Views Right Result Date: 04/14/2023 CLINICAL DATA:  Fall after knee scaphoid EXAM: LEFT KNEE - COMPLETE 4+ VIEW; RIGHT KNEE - COMPLETE 4+ VIEW COMPARISON:  None Available. FINDINGS: No acute fracture or dislocation. 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 There is moderate to advanced medial compartment narrowing on the left and moderate to advanced medial and lateral compartment narrowing on the right. No knee joint effusion. Vascular calcifications. IMPRESSION: 1. No acute fracture or dislocation. 2. 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 Electronically Signed   By: Norman Gatlin M.D.   On: 04/14/2023 19:28   DG Knee Complete 4 Views Left Result Date: 04/14/2023 CLINICAL DATA:  Fall after  knee scaphoid EXAM: LEFT KNEE - COMPLETE 4+ VIEW; RIGHT KNEE - COMPLETE 4+ VIEW COMPARISON:  None Available. FINDINGS: No acute fracture or dislocation. 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 There is moderate to advanced medial compartment narrowing on the left and moderate to advanced medial and lateral compartment narrowing on the right. No knee joint effusion. Vascular calcifications. IMPRESSION: 1. No acute fracture or dislocation. 2. 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 Electronically Signed   By: Norman Gatlin M.D.   On: 04/14/2023 19:28   DG Ankle Complete Right Result Date: 04/14/2023 CLINICAL DATA:  Ankle pain following fall, initial encounter EXAM: RIGHT ANKLE -  COMPLETE 3+ VIEW COMPARISON:  None Available. FINDINGS: No acute fracture or dislocation is noted. Flattening of the plantar arch is noted. Tarsal degenerative changes are seen. IMPRESSION: Chronic changes without acute abnormality. Electronically Signed   By: Oneil Devonshire M.D.   On: 04/14/2023 19:25   DG Hip Unilat With Pelvis 2-3 Views Left Result Date: 04/14/2023 CLINICAL DATA:  Hip pain after fall. EXAM: DG HIP (WITH OR WITHOUT PELVIS) 2-3V RIGHT; DG HIP (WITH OR WITHOUT PELVIS) 2-3V LEFT COMPARISON:  None Available. FINDINGS: Bilateral THA. No radiographic evidence of loosening. No acute fracture or dislocation. Heterotopic ossification about both proximal femurs. Pelvic phleboliths. IMPRESSION: Negative. Electronically Signed   By: Norman Gatlin M.D.   On: 04/14/2023 19:25   DG Hip Unilat With Pelvis 2-3 Views Right Result Date: 04/14/2023 CLINICAL DATA:  Hip pain after fall. EXAM: DG HIP (WITH OR WITHOUT PELVIS) 2-3V RIGHT; DG HIP (WITH OR WITHOUT PELVIS) 2-3V LEFT COMPARISON:  None Available. FINDINGS: Bilateral THA. No radiographic evidence of loosening. No acute fracture or dislocation. Heterotopic ossification about both proximal femurs. Pelvic phleboliths. IMPRESSION: Negative.  Electronically Signed   By: Norman Gatlin M.D.   On: 04/14/2023 19:25    EKG: 04/15/2023 at 749 - AP, VS with frequent PVCs(personally reviewed)  TELEMETRY: predominantly sinus rhythm with AV conduction. Unusual pacing behavior No bradycardia, no evidence of lack of pacing.(personally reviewed)  DEVICE HISTORY:   Device: BSC Accolade MRI EL L331 SN# [id number] DOI: 09/16/2022 RA Lead: BSC 7841 Ingevity SN# [id number] DOI: 09/16/2022 LBAP: BSC 7842 Ingevity SN# [id number] DOI: 09/16/2022 PARAMETERS: Mode: DDDR LRL: 60 UTR: 130 USR: 130 PAV: 120-186ms SAV: 100-190ms  Voltage: RA: Auto @.4ms RV: Auto @.4ms Vector: RA: Bi/Bi RV: Bi/Bi  BATT IMPEDANCE P R RA LBB AF% DATE YRS RA LBB  09/16/22 688 764 5.1 7.1 .5v@.4ms .7v@.4ms 10/13/22 [age]y 592 648 8.1 8.5 .5v@.4ms .9v@.4ms 0.0% 01/10/23 10.0y 569 616 1.2 12.7 .5v@.4ms .8v@.4ms <1%  HISTOGRAMS Since: 10/13/2022 Total VP: 41% Total AP: 83% AT/AF: <1% AT/AF events: 1 Time in AT/AF: 52 sec    Assessment/Plan: #) PPM in situ #) bacteremic, staph aureus #) osteomyelitis Presented to hospital with osteomyelitis of L toe, s/p resection with podiatry Blood cultures 12/27 positive with staph aureus, repeat blood cultures 12/29 with NGTD TEE without vegetation or thrombus on PPM leads Does not appear to be device-dependent Awaiting formal device interrogation by Bos Sci rep Patient meets criteria for PPM extraction  EP MD to see and make final recommendations        For questions or updates, please contact [facility] Please consult [email or web address] for contact info under Cardiology/STEMI.  Signed, Seddrick Flax, NP  04/19/2023 1:37 PM

## 2023-04-19 NOTE — Progress Notes (Signed)
 OT Cancellation Note  Patient Details Name: Adam Ponce MRN: [id number] DOB: [birth year]   Cancelled Treatment:    Reason Eval/Treat Not Completed: Patient at procedure or test/ unavailable (cath lab)  [name] 04/19/2023, 7:55 AM

## 2023-04-19 NOTE — Transfer of Care (Signed)
 Immediate Anesthesia Transfer of Care Note  Patient: Adam Ponce  Procedure(s) Performed: TRANSESOPHAGEAL ECHOCARDIOGRAM  Patient Location: PACU and Cath Lab  Anesthesia Type:MAC  Level of Consciousness: drowsy and responds to stimulation  Airway & Oxygen Therapy: Patient Spontanous Breathing and Patient connected to nasal cannula oxygen  Post-op Assessment: Report given to RN and Post -op Vital signs reviewed and stable  Post vital signs: Reviewed and stable  Last Vitals:  Vitals Value Taken Time  BP 120/67 04/19/23 0900  Temp 37.1 C 04/19/23 0857  Pulse 76 04/19/23 0901  Resp 28 04/19/23 0901  SpO2 94 % 04/19/23 0901  Vitals shown include unfiled device data.  Last Pain:  Vitals:   04/19/23 0857  TempSrc: Tympanic  PainSc: Asleep      Patients Stated Pain Goal: 2 (04/18/23 2000)  Complications: No notable events documented.

## 2023-04-19 NOTE — CV Procedure (Signed)
 TRANSESOPHAGEAL ECHOCARDIOGRAM (TEE) NOTE  INDICATIONS: infective endocarditis  PROCEDURE:   Informed consent was obtained prior to the procedure. The risks, benefits and alternatives for the procedure were discussed and the patient comprehended these risks.  Risks include, but are not limited to, cough, sore throat, vomiting, nausea, somnolence, esophageal and stomach trauma or perforation, bleeding, low blood pressure, aspiration, pneumonia, infection, trauma to the teeth and death.    After a procedural time-out, the patient was given propofol  for sedation by anesthesia. See their separate report.  The patient's heart rate, blood pressure, and oxygen saturation are monitored continuously during the procedure.The oropharynx was anesthetized with topical cetacaine.  The transesophageal probe was inserted in the esophagus and stomach without difficulty and multiple views were obtained.  The patient was kept under observation until the patient left the procedure room.  I was present face-to-face 100% of this time. The patient left the procedure room in stable condition.   Agitated microbubble saline contrast was not administered.  COMPLICATIONS:    There were no immediate complications.  Findings:  LEFT VENTRICLE: The left ventricular wall thickness is mildly increased.  The left ventricular cavity is normal in size. Wall motion is globally hypokinetic  LVEF is 45-50%.  RIGHT VENTRICLE:  The right ventricle is normal in structure and function without any thrombus or masses.  Pacer wires noted.  LEFT ATRIUM:  The left atrium is normal in size without any thrombus or masses.  There is not spontaneous echo contrast (smoke) in the left atrium consistent with a low flow state.  LEFT ATRIAL APPENDAGE:  The left atrial appendage is free of any thrombus or masses. The appendage has single lobes. Pulse doppler indicates high flow in the appendage.  ATRIAL SEPTUM:  The atrial septum appears  intact and is free of thrombus and/or masses.  There is no evidence for interatrial shunting by color doppler and saline microbubble (incidental with infusion).  RIGHT ATRIUM:  The right atrium is mildly dilated in size and function without any thrombus or masses. Pacer wires noted  MITRAL VALVE:  The mitral valve is normal in structure and function with  trivial  regurgitation.  There were no vegetations or stenosis.  AORTIC VALVE:  The aortic valve is trileaflet, normal in structure and function with  no  regurgitation.  There were no vegetations or stenosis  TRICUSPID VALVE:  The tricuspid valve is normal in structure and function with Mild regurgitation.  There were no vegetations or stenosis. Pacemaker wires were noted.   PULMONIC VALVE:  The pulmonic valve is normal in structure and function with  trivial  regurgitation.  There were no vegetations or stenosis.   AORTIC ARCH, ASCENDING AND DESCENDING AORTA:  There was grade 1 Shaune et. Al, 1992) atherosclerosis of the a aortic arch.  12. PULMONARY VEINS: Anomalous pulmonary venous return was not noted.  13. PERICARDIUM: The pericardium appeared normal and non-thickened.  There is a trivial posterior pericardial effusion.  IMPRESSION:   No endocarditis. Pacemaker wires noted without vegetation or thrombus No LAA thrombus Negative for PFO Mild TR, trivial MR LVEF 45-50%, global hypokinesis Trivial posterior pericardial effusion.  RECOMMENDATIONS:     Antibiotics per ID for bacteremia without endocarditis.  Time Spent Directly with the Patient:  45 minutes   Adam KYM Maxcy, MD, [facility], FACP  [place]  [facility]  Medical Director of the [facility] &  Cardiovascular Risk Reduction Clinic Diplomate of the American Board of Clinical Lipidology Attending  Cardiologist  Direct Dial: [phone]  Fax: [phone]  Website:  [email or web address] Adam Ponce 04/19/2023, 8:57 AM

## 2023-04-20 DIAGNOSIS — B20 Human immunodeficiency virus [HIV] disease: Secondary | ICD-10-CM

## 2023-04-20 DIAGNOSIS — L089 Local infection of the skin and subcutaneous tissue, unspecified: Secondary | ICD-10-CM | POA: Diagnosis not present

## 2023-04-20 DIAGNOSIS — T827XXD Infection and inflammatory reaction due to other cardiac and vascular devices, implants and grafts, subsequent encounter: Secondary | ICD-10-CM

## 2023-04-20 DIAGNOSIS — E11628 Type 2 diabetes mellitus with other skin complications: Secondary | ICD-10-CM | POA: Diagnosis not present

## 2023-04-20 LAB — BASIC METABOLIC PANEL
Anion gap: 9 (ref 5–15)
BUN: 19 mg/dL (ref 8–23)
CO2: 24 mmol/L (ref 22–32)
Calcium: 8.2 mg/dL — ABNORMAL LOW (ref 8.9–10.3)
Chloride: 96 mmol/L — ABNORMAL LOW (ref 98–111)
Creatinine, Ser: 1.38 mg/dL — ABNORMAL HIGH (ref 0.61–1.24)
GFR, Estimated: 54 mL/min — ABNORMAL LOW (ref >60)
Glucose, Bld: 173 mg/dL — ABNORMAL HIGH (ref 70–99)
Potassium: 4.2 mmol/L (ref 3.5–5.1)
Sodium: 129 mmol/L — ABNORMAL LOW (ref 135–145)

## 2023-04-20 LAB — GLUCOSE, CAPILLARY
Glucose-Capillary: 138 mg/dL — ABNORMAL HIGH (ref 70–99)
Glucose-Capillary: 139 mg/dL — ABNORMAL HIGH (ref 70–99)
Glucose-Capillary: 145 mg/dL — ABNORMAL HIGH (ref 70–99)
Glucose-Capillary: 152 mg/dL — ABNORMAL HIGH (ref 70–99)
Glucose-Capillary: 183 mg/dL — ABNORMAL HIGH (ref 70–99)
Glucose-Capillary: 184 mg/dL — ABNORMAL HIGH (ref 70–99)
Glucose-Capillary: 192 mg/dL — ABNORMAL HIGH (ref 70–99)
Glucose-Capillary: 218 mg/dL — ABNORMAL HIGH (ref 70–99)

## 2023-04-20 LAB — CULTURE, BLOOD (ROUTINE X 2)
Culture: NO GROWTH
Special Requests: ADEQUATE

## 2023-04-20 NOTE — TOC Progression Note (Signed)
 Transition of Care [facility]) - Progression Note    Patient Details  Name: Adam Ponce MRN: [id number] Date of Birth: 29-Sep-1950  Transition of [facility] CM/SW Contact  Montie LOISE Louder, [place] Phone Number: 04/20/2023, 10:51 AM  Clinical Narrative:     Insurance still pending for [employer]   Montie Louder, MSW, LCSW Clinical Social Worker    Expected Discharge Plan: Skilled Nursing Facility Barriers to Discharge: Continued Medical Work up  Expected Discharge Plan and Services In-house Referral: Clinical Social Work     Living arrangements for the past 2 months: Apartment                                       Social Determinants of Health (SDOH) Interventions SDOH Screenings   Food Insecurity: Food Insecurity Present (04/15/2023)  Housing: High Risk (04/15/2023)  Transportation Needs: Unmet Transportation Needs (04/15/2023)  Utilities: At Risk (04/15/2023)  Depression (PHQ2-9): Low Risk  (03/20/2020)  Recent Concern: Depression (PHQ2-9) - Medium Risk (01/10/2020)  Social Connections: Patient Declined (04/18/2023)  Tobacco Use: Medium Risk (04/18/2023)    Readmission Risk Interventions     No data to display

## 2023-04-20 NOTE — Progress Notes (Signed)
 OT NOTE  04/20/23 1004  OT Visit Information  Last OT Received On 04/20/23  Assistance Needed +2  History of Present Illness 73 y.o. male presents to [facility] hospital on 04/14/2023 after a fall. Pt admitted with concern for L foot infection and rhabdomyolysis. He underwent L 2nd toe amp 04/18/23. PMH includes a flutter s/p PPM, DMII, toe amputation, HIV, HTN, HLD, GERD, COPD, CKD III, OSA, BPH.  Precautions  Precautions Fall  Required Braces or Orthoses Other Brace  Other Brace post op shoe  Restrictions  Weight Bearing Restrictions Per Provider Order No  LLE Weight Bearing Per Provider Order PWB  LLE Partial Weight Bearing Percentage or Pounds heel WB in post op shoe for transfers only  Home Living  Family/patient expects to be discharged to: Skilled nursing facility  Prior Function  Prior Level of Function  Independent/Modified Independent  Mobility Comments ambulatory with SPC or rollator  Pain Assessment  Pain Assessment Faces  Faces Pain Scale 8  Pain Location R LE with any movement  Pain Descriptors / Indicators Discomfort;Grimacing;Guarding  Pain Intervention(s) Monitored during session;Premedicated before session;Limited activity within patient's tolerance;Repositioned  Cognition  Arousal Alert  Behavior During Therapy [facility] for tasks assessed/performed  Overall Cognitive Status Within Functional Limits for tasks assessed  Communication  Communication No apparent difficulties  Upper Extremity Assessment  Upper Extremity Assessment Generalized weakness  Lower Extremity Assessment  Lower Extremity Assessment Generalized weakness;RLE deficits/detail  RLE Deficits / Details any tactile input or movement during session. pt very guarded  Cervical / Trunk Assessment  Cervical / Trunk Assessment Other exceptions  Cervical / Trunk Exceptions body habitus  Vision- History  Baseline Vision/History 0 No visual deficits  Vision- Assessment  Vision Assessment? No apparent visual deficits   ADL  Overall ADL's  Needs assistance/impaired  Eating/Feeding Independent;Sitting  Grooming Wash/dry hands;Wash/dry face;Independent;Sitting  Upper Body Bathing Moderate assistance;Sitting  Lower Body Bathing Maximal assistance  Upper Body Dressing  Moderate assistance  Lower Body Dressing Maximal assistance  General ADL Comments hoyer lifted to the chair for self feeding. pt was not engaging in meal in bed. pt reluctant to dangle eob but agreeable to hoyer. pt states that was 10/10 this feels much better  Bed Mobility  Overal bed mobility Needs Assistance  Bed Mobility Rolling  Rolling Max assist  General bed mobility comments pt rolling toward L side easier than R side. pt requires strong use of bed rails. pt with use of pad to roll toward R  Transfers  Overall transfer level Needs assistance  Transfers Bed to chair/wheelchair/BSC  Bed to/from chair/wheelchair/BSC transfer type: Via Lift equipment  Transfer via Lift Equipment Maximove  General transfer comment total (A) to chair in maximove. pt declined to dangle  General Comments  General comments (skin integrity, edema, etc.) VSS on RA  OT - End of Session  Activity Tolerance Patient tolerated treatment well  Patient left in chair;with call bell/phone within reach;with chair alarm set  Nurse Communication Mobility status;Precautions;Need for lift equipment  OT Assessment  OT Recommendation/Assessment Patient needs continued OT Services  OT Visit Diagnosis Unsteadiness on feet (R26.81);Muscle weakness (generalized) (M62.81)  OT Problem List Decreased strength;Decreased activity tolerance;Impaired balance (sitting and/or standing);Decreased safety awareness;Decreased knowledge of use of DME or AE;Decreased knowledge of precautions;Cardiopulmonary status limiting activity;Obesity;Pain  OT Plan  OT Frequency (ACUTE ONLY) Min 1X/week  OT Treatment/Interventions (ACUTE ONLY) Self-care/ADL training;Therapeutic exercise;Energy  conservation;DME and/or AE instruction;Manual therapy;Modalities;Therapeutic activities;Patient/family education;Balance training  AM-PAC OT 6 Clicks Daily Activity Outcome Measure (Version  2)  Help from another person eating meals? 4  Help from another person taking care of personal grooming? 4  Help from another person toileting, which includes using toliet, bedpan, or urinal? 2  Help from another person bathing (including washing, rinsing, drying)? 2  Help from another person to put on and taking off regular upper body clothing? 3  Help from another person to put on and taking off regular lower body clothing? 2  6 Click Score 17  Progressive Mobility  What is the highest level of mobility based on the progressive mobility assessment? Level 2 (Chairfast) - Balance while sitting on edge of bed and cannot stand  Mobility Referral No  Activity Transferred from bed to chair  OT Recommendation  Follow Up Recommendations Skilled nursing-short term rehab (<3 hours/day)  Patient can return home with the following Two people to help with bathing/dressing/bathroom  Functional Status Assessent Patient has had a recent decline in their functional status and demonstrates the ability to make significant improvements in function in a reasonable and predictable amount of time.  OT Equipment Wheelchair (measurements OT);Wheelchair cushion (measurements OT);Hospital bed;Hoyer lift  Individuals Consulted  Consulted and Agree with Results and Recommendations Patient  Acute Rehab OT Goals  Patient Stated Goal to do whatever the doctors tell me to do  OT Goal Formulation With patient  Time For Goal Achievement 05/04/23  Potential to Achieve Goals Good  OT Time Calculation  OT Start Time (ACUTE ONLY) 0929  OT Stop Time (ACUTE ONLY) 0955  OT Time Calculation (min) 26 min  OT General Charges  $OT Visit 1 Visit  OT Evaluation  $OT Eval Moderate Complexity 1 Mod   Brynn, OTR/L  Acute Rehabilitation  Services Office: [phone] .

## 2023-04-20 NOTE — Progress Notes (Signed)
 Electrophysiology Progress Note  Patient Name: Adam Ponce Date of Encounter: 04/20/2023  Primary Cardiologist: None   Subjective   No complaints at present.  Inpatient Medications    Scheduled Meds:  acetaminophen   1,000 mg Oral QID   azelastine   2 spray Each Nare BID   Chlorhexidine  Gluconate Cloth  6 each Topical Daily   darunavir -cobicistat   1 tablet Oral Q breakfast   emtricitabine -tenofovir  AF  1 tablet Oral Daily   enoxaparin  (LOVENOX ) injection  60 mg Subcutaneous QHS   insulin  aspart  0-9 Units Subcutaneous Q4H   insulin  aspart  3 Units Subcutaneous TID WC   insulin  glargine-yfgn  15 Units Subcutaneous QHS   pantoprazole   40 mg Oral Daily   sodium chloride  flush  10-40 mL Intracatheter Q12H   Continuous Infusions:   ceFAZolin  (ANCEF ) IV 2 g (04/20/23 0537)   PRN Meds: albuterol , alum & mag hydroxide-simeth, oxyCODONE , sodium chloride  flush   Vital Signs    Vitals:   04/19/23 1609 04/19/23 1924 04/19/23 2355 04/20/23 0305  BP: (!) 132/53 125/66 (!) 123/56 (!) 125/58  Pulse: 81  76 72  Resp: 17 (!) 21 20 18   Temp: 98.9 F (37.2 C) 98.2 F (36.8 C) 97.8 F (36.6 C) 98.4 F (36.9 C)  TempSrc: Oral Oral Oral Oral  SpO2: 94%  94% 93%  Weight:      Height:        Intake/Output Summary (Last 24 hours) at 04/20/2023 0749 Last data filed at 04/20/2023 0400 Gross per 24 hour  Intake 1950 ml  Output 1100 ml  Net 850 ml   Filed Weights   04/15/23 0354 04/18/23 0657  Weight: 126 kg 126 kg    Telemetry and Device interrogation    Sinus and competitive atrial pacing; occasional V-pacing with frequent fused ventricular beats - Personally Reviewed  Simultaneously, the device is seeing a predominantly A-paced rhythm with sensed ventricular beats and occasional V-pacing.  ECG    No new - Personally Reviewed  Physical Exam   GEN: No acute distress.   Cardiac: RRR, no murmurs, rubs, or gallops.  Respiratory: Breathing easily MS: Left foot  bandaged Neuro:  Nonfocal  Psych: Normal affect   Labs    Chemistry Recent Labs  Lab 04/14/23 2116 04/15/23 0107 04/17/23 0655 04/18/23 0431 04/20/23 0500  NA 133*   < > 130* 131* 129*  K 4.8   < > 4.2 4.2 4.2  CL 100   < > 98 96* 96*  CO2 21*   < > 22 23 24   GLUCOSE 247*   < > 211* 223* 173*  BUN 25*   < > 19 18 19   CREATININE 2.34*   < > 1.47* 1.45* 1.38*  CALCIUM 9.8   < > 8.6* 8.5* 8.2*  PROT 8.2*  --   --   --   --   ALBUMIN 3.6  --   --   --   --   AST 39  --   --   --   --   ALT 22  --   --   --   --   ALKPHOS 55  --   --   --   --   BILITOT 0.8  --   --   --   --   GFRNONAA 29*   < > 50* 51* 54*  ANIONGAP 12   < > 10 12 9    < > = values in this interval not displayed.  Hematology Recent Labs  Lab 04/16/23 0852 04/17/23 0655 04/18/23 0431  WBC 12.2* 11.0* 10.3  RBC 4.22 4.16* 4.21*  HGB 13.6 13.3 13.3  HCT 39.6 38.3* 38.5*  MCV 93.8 92.1 91.4  MCH 32.2 32.0 31.6  MCHC 34.3 34.7 34.5  RDW 13.9 13.8 13.6  PLT 200 214 227    Cardiac EnzymesNo results for input(s): TROPONINI in the last 168 hours. No results for input(s): TROPIPOC in the last 168 hours.   BNP Recent Labs  Lab 04/15/23 0107  BNP 275.4*     DDimer No results for input(s): DDIMER in the last 168 hours.   Summary of Pertinent studies    TEE: 12/31 - EF 45-50%, no vegetations seen   Patient Profile     73 y.o. 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 Trixie.   Assessment & Plan    MSSA bacteremia Osteomyelitis and abscess of the left foot 2nd toe is the likely source No vegetation seen on TEE Device was placed within the past year He is 80% A-paced and 45% V-paced I programmed his device today to DDD 40 to get a better idea of  his device-dependence. He is currently A-V sensed at about 70 bpm I anticipate plan for extraction prior to discharge Antibiotics per ID   HIV HIV RNA not detected 04/17/2023 On Descovy /prezcobix     Abnormal pacemaker behavior Observed on telemetry - I did not review all strips today, but there appear to be episodes of accelerated junctional rhythm competing with atrial pacing  AKI on CKD stage IIIa Cr continues to improve.   Syncope Attributable to sepsis Pacemaker interrogation did not show any arrhythmia  Atrial flutter No flutter events on interrogation this admission [facility]  IDDM - uncontrolled On insulin     For questions or updates, please contact [facility] Please consult [email or web address] for contact info under Cardiology/STEMI.      Signed, Eulas FORBES Furbish, MD 04/20/2023, 7:49 AM

## 2023-04-20 NOTE — Progress Notes (Signed)
 PROGRESS NOTE  [name] FMW:[id number] DOB: 1950-08-19 DOA: 04/14/2023 PCP: System, Provider Not In   LOS: 5 days   Brief Narrative / Interim history: Adam Ponce is a 73 y.o. male with medical history significant of atrial flutter status post PPM on Coumadin, insulin -dependent type 2 diabetes, peripheral neuropathy, history of toe amputations, HIV, hypertension, hyperlipidemia, GERD, asthma/COPD, CKD stage IIIa, OSA, ILD, BPH presented to the ED after he had a fall due to generalized weakness - found down by maintenance at facility who called EMS -reports weakness after using bathroom with syncopal event - unknown downtime.   Subjective / 24h Interval events: He has no complaints this morning other than his chronic indigestion, no shortness of breath, no nausea or vomiting.  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  ID and podiatry were consulted, and cardiology since he has a pacemaker -He is status post amputation of the remaining second toe on 04/08/2023.  Intraoperative cultures showing MSSA, -Antibiotics per ID, on Ancef  -TEE 12/31 without vegetation on the pacemaker leads, no evidence of endocarditis -Final antibiotics plans per ID.  Needs SNF -Cardiology evaluating timing and if pacemaker can be removed  Active problems Rhabdomyolysis -due to being down, creatinine improving  AKI on CKD stage IIIa -Creatinine downtrending appropriately with supportive care, most recent creatinine ratio was 1.3  in 2020.  Creatinine was as high as 2.34 on admission, and currently at baseline   Heart failure, ruled out -Echo EF 50 to 55% low normal left ventricular function without wall motion abnormalities, without diastolic dysfunction    Mild hyponatremia - Asymptomatic, not clinically relevant, continue to monitor   Syncope - Likely vasovagal event. CT head negative for acute intracranial abnormality and no focal neurodeficit on exam. PE less likely given no tachycardia, chest pain, or hypoxia.    Generalized weakness - PT/OT eval, fall precautions.   Atrial flutter - Has a pacemaker.  per patient he was taken off coumadin by his PCP at the [facility].    Iddm, uncontrolled with hyperglycemia -SSI and long acting -continue hypoglycemic protocol. A1c 8.6  Lab Results  Component Value Date   HGBA1C 8.6 (H) 04/15/2023   CBG (last 3)  Recent Labs    04/20/23 0002 04/20/23 0308 04/20/23 0755  GLUCAP 145* 152* 138*     Hypertension -resume meds as necessary, normotensive today   Hyperlipidemia - Hold statin at this time in the setting of rhabdomyolysis.   HIV -continue home Descovy /prezcobix   Asthma/COPD - Stable, no signs of acute exacerbation.  ILD - stable/asymptomatic  BPH - stable asymptomatic  Scheduled Meds:  acetaminophen   1,000 mg Oral QID   azelastine   2 spray Each Nare BID   Chlorhexidine  Gluconate Cloth  6 each Topical Daily   darunavir -cobicistat   1 tablet Oral Q breakfast   emtricitabine -tenofovir  AF  1 tablet Oral Daily   enoxaparin  (LOVENOX ) injection  60 mg Subcutaneous QHS   insulin  aspart  0-9 Units Subcutaneous Q4H   insulin  aspart  3 Units Subcutaneous  TID WC   insulin  glargine-yfgn  15 Units Subcutaneous QHS   pantoprazole   40 mg Oral Daily   sodium chloride  flush  10-40 mL Intracatheter Q12H   Continuous Infusions:   ceFAZolin  (ANCEF ) IV 2 g (04/20/23 0537)   PRN Meds:.albuterol , alum & mag hydroxide-simeth, oxyCODONE , sodium chloride  flush  Current  Outpatient Medications  Medication Instructions   albuterol  (PROVENTIL  HFA;VENTOLIN  HFA) 108 (90 Base) MCG/ACT inhaler Every 6 hours PRN   Azelastine  HCl 137 MCG/SPRAY SOLN 2 sprays, As needed   bacitracin 500 UNIT/GM ointment 1 Application, 2 times daily   benzonatate (TESSALON) 100 mg, Every 8 hours PRN   betamethasone valerate (VALISONE) 0.1 % cream 2 times daily   Cholecalciferol (VITAMIN D3) 10000 units TABS Take by mouth.   cyanocobalamin (VITAMIN B12) 1,000 mcg,  Once   darunavir  (PREZISTA ) 800 mg   darunavir -cobicistat  (PREZCOBIX ) 800-150 MG tablet Oral   docusate sodium  (COLACE) 100 mg, 2 times daily   emtricitabine -tenofovir  AF (DESCOVY ) 200-25 MG tablet 1 tablet, Oral, Daily   ezetimibe (ZETIA) 10 mg, Daily   famotidine (PEPCID) 20 mg, 2 times daily   fenofibrate (TRICOR) 145 mg, Daily   glipiZIDE (GLUCOTROL) 10 mg, Daily before breakfast   guaiFENesin (MUCINEX) 600 mg, Every 12 hours PRN   hydrocerin (EUCERIN) CREA 1 Application, As needed   INSULIN  GLARGINE [place] 16 Units, Daily at bedtime   ipratropium (ATROVENT) 0.06 % nasal spray 2 sprays, 3 times daily PRN   metFORMIN (GLUCOPHAGE) 500 mg, 2 times daily with meals   pravastatin (PRAVACHOL) 80 mg, Daily   pregabalin  (LYRICA ) 100 mg, 2 times daily   ritonavir (NORVIR) 100 MG TABS tablet Take by mouth.   Semaglutide (2 MG/DOSE) 2 mg, Subcutaneous, Weekly, Inject on Thursday   sennosides-docusate sodium  (SENOKOT-S) 8.6-50 MG tablet 1 tablet, Daily   sodium chloride  (OCEAN) 0.65 % nasal spray 1 spray, 4 times daily PRN   terazosin (HYTRIN) 2 MG capsule 1 capsule, Oral, Daily at bedtime   traMADol  (ULTRAM ) 50 MG tablet Every 6 hours PRN   triamcinolone cream (KENALOG) 0.1 % 1 Application, Daily PRN   valACYclovir (VALTREX) 1,000 mg, 2 times daily    Diet Orders (From admission, onward)     Start     Ordered   04/19/23 1030  Diet Heart Fluid consistency: Thin  Diet effective now       Question:  Fluid consistency:  Answer:   Thin   04/19/23 1029            DVT prophylaxis:    Lab Results  Component Value Date   PLT 227 04/18/2023      Code Status: Do not attempt resuscitation (DNR) PRE-ARREST INTERVENTIONS DESIRED  Family Communication: No family at bedside, discussed with sister over the phone  Status is: Inpatient Remains inpatient appropriate because: severity of illness  Level of care: Telemetry Medical  Consultants:  Podiatry ID  Objective: Vitals:   04/19/23 1924 04/19/23 2355 04/20/23 0305 04/20/23 0758  BP: 125/66 (!) 123/56 (!) 125/58 127/62  Pulse:  76 72 61  Resp: (!) 21 20 18 18   Temp: 98.2 F (36.8 C) 97.8 F (36.6 C) 98.4 F (36.9 C) 98.3 F (36.8 C)  TempSrc: Oral Oral Oral Oral  SpO2:  94% 93% 92%  Weight:      Height:        Intake/Output Summary (Last 24 hours) at 04/20/2023 1008 Last data filed at 04/20/2023 0758 Gross per 24 hour  Intake 1750 ml  Output 1500 ml  Net 250 ml   Wt Readings from Last 3 Encounters:  04/18/23 126 kg  10/28/21 126.1 kg  03/18/20 127.3 kg    Examination:  Constitutional: NAD Eyes: lids and conjunctivae normal, no scleral icterus ENMT: mmm Neck: normal, supple Respiratory: clear to auscultation bilaterally, no wheezing, no crackles. Normal respiratory effort.  Cardiovascular: Regular rate and rhythm, no murmurs / rubs / gallops. No LE edema. Abdomen: soft, no distention, no tenderness. Bowel sounds positive.   Data Reviewed: I have independently reviewed following labs and imaging studies   CBC Recent Labs  Lab 04/14/23 2116 04/15/23 0107 04/16/23 0852 04/17/23 0655 04/18/23 0431  WBC 15.3* 15.4* 12.2* 11.0* 10.3  HGB 14.3 13.7 13.6 13.3 13.3  HCT 42.7 40.0 39.6 38.3* 38.5*  PLT 238 224 200 214 227  MCV 93.8 93.5 93.8 92.1 91.4  MCH 31.4 32.0 32.2 32.0 31.6  MCHC 33.5 34.3 34.3 34.7 34.5  RDW 14.3 14.4 13.9 13.8 13.6  LYMPHSABS 1.9  --   --   --   --   MONOABS 1.4*  --   --   --   --   EOSABS 0.0  --   --    --   --   BASOSABS 0.0  --   --   --   --     Recent Labs  Lab 04/14/23 2116 04/15/23 0107 04/15/23 0115 04/15/23 0302 04/16/23 0852 04/17/23 0655 04/18/23 0431 04/20/23 0500  NA 133* 131*  --   --  131* 130* 131* 129*  K 4.8 4.4  --   --  4.5 4.2 4.2 4.2  CL 100 98  --   --  100 98 96* 96*  CO2 21* 19*  --   --  23 22 23 24   GLUCOSE 247* 266*  --   --  232* 211* 223* 173*  BUN 25* 27*  --   --  19 19 18 19   CREATININE 2.34* 2.16*  --   --  1.86* 1.47* 1.45* 1.38*  CALCIUM 9.8 9.3  --   --  8.9 8.6* 8.5* 8.2*  AST 39  --   --   --   --   --   --   --   ALT 22  --   --   --   --   --   --   --   ALKPHOS 55  --   --   --   --   --   --   --   BILITOT 0.8  --   --   --   --   --   --   --   ALBUMIN 3.6  --   --   --   --   --   --   --   CRP  --  18.4*  --   --   --   --   --   --   LATICACIDVEN  --   --  1.3 1.3  --   --   --   --   INR  --  1.2  --   --   --   --   --   --   HGBA1C  --  8.6*  --   --   --   --   --   --   BNP  --  275.4*  --   --   --   --   --   --     ------------------------------------------------------------------------------------------------------------------  No results for input(s): CHOL, HDL, LDLCALC, TRIG, CHOLHDL, LDLDIRECT in the last 72 hours.  Lab Results  Component Value Date   HGBA1C 8.6 (H) 04/15/2023   ------------------------------------------------------------------------------------------------------------------ No results for input(s): TSH, T4TOTAL, T3FREE, THYROIDAB in the last 72 hours.  Invalid input(s): FREET3  Cardiac Enzymes No results for input(s): CKMB, TROPONINI, MYOGLOBIN in the last 168 hours.  Invalid input(s): CK ------------------------------------------------------------------------------------------------------------------    Component Value Date/Time   BNP 275.4 (H) 04/15/2023 0107    CBG: Recent Labs  Lab 04/19/23 1645 04/19/23 1931 04/20/23 0002 04/20/23 0308  04/20/23 0755  GLUCAP 186* 162* 145* 152* 138*    Recent Results (from the past 240 hours)  Blood Culture (routine x 2)     Status: None   Collection Time: 04/15/23  1:07 AM   Specimen: BLOOD RIGHT HAND  Result Value Ref Range Status   Specimen Description BLOOD RIGHT HAND  Final   Special Requests   Final    BOTTLES DRAWN AEROBIC AND ANAEROBIC Blood Culture adequate volume   Culture   Final    NO GROWTH 5 DAYS Performed at [facility], [address] [place], [place] [address]    Report Status 04/20/2023 FINAL  Final  Blood Culture (routine x 2)     Status: Abnormal   Collection Time: 04/15/23  1:07 AM   Specimen: BLOOD RIGHT HAND  Result Value Ref Range Status   Specimen Description BLOOD RIGHT HAND  Final   Special Requests   Final    BOTTLES DRAWN AEROBIC AND ANAEROBIC Blood Culture adequate volume   Culture  Setup Time   Final    GRAM POSITIVE COCCI ANAEROBIC BOTTLE ONLY CRITICAL RESULT CALLED TO, READ BACK BY AND VERIFIED WITH: PHARMD L. LAURENCE [id number] @ 2204 FH Performed at [facility], [address], [place], [place] [address]    Culture STAPHYLOCOCCUS AUREUS (A)  Final   Report Status 04/17/2023 FINAL  Final   Organism ID, Bacteria STAPHYLOCOCCUS AUREUS  Final      Susceptibility   Staphylococcus aureus - MIC*    CIPROFLOXACIN <=0.5 SENSITIVE Sensitive     ERYTHROMYCIN >=8 RESISTANT Resistant     GENTAMICIN  <=0.5 SENSITIVE Sensitive     OXACILLIN <=0.25 SENSITIVE Sensitive     TETRACYCLINE <=1 SENSITIVE Sensitive     VANCOMYCIN  <=0.5 SENSITIVE Sensitive     TRIMETH/SULFA 160 RESISTANT Resistant     CLINDAMYCIN <=0.25 SENSITIVE Sensitive     RIFAMPIN <=0.5 SENSITIVE Sensitive     Inducible Clindamycin NEGATIVE Sensitive     LINEZOLID 2 SENSITIVE Sensitive     * STAPHYLOCOCCUS AUREUS  Blood Culture ID Panel (Reflexed)     Status: Abnormal   Collection Time: 04/15/23  1:07 AM  Result Value Ref Range Status   Enterococcus faecalis NOT DETECTED NOT  DETECTED Final   Enterococcus Faecium NOT DETECTED NOT DETECTED Final   Listeria monocytogenes NOT DETECTED NOT DETECTED Final   Staphylococcus species DETECTED (A) NOT DETECTED Final    Comment: CRITICAL RESULT CALLED TO, READ BACK BY AND VERIFIED WITH: PHARMD L. CHEN [id number] @ 2204 FH    Staphylococcus aureus (BCID) DETECTED (A) NOT DETECTED Final    Comment: CRITICAL RESULT CALLED TO, READ BACK BY AND VERIFIED WITH: PHARMD L. CHEN [id number] @ 2204 FH    Staphylococcus epidermidis NOT DETECTED NOT DETECTED Final   Staphylococcus lugdunensis NOT DETECTED NOT DETECTED Final   Streptococcus species NOT DETECTED NOT DETECTED Final   Streptococcus agalactiae  NOT DETECTED NOT DETECTED Final   Streptococcus pneumoniae NOT DETECTED NOT DETECTED Final   Streptococcus pyogenes NOT DETECTED NOT DETECTED Final   A.calcoaceticus-baumannii NOT DETECTED NOT DETECTED Final   Bacteroides fragilis NOT DETECTED NOT DETECTED Final   Enterobacterales NOT DETECTED NOT DETECTED Final   Enterobacter cloacae complex NOT DETECTED NOT DETECTED Final   Escherichia coli NOT DETECTED NOT DETECTED Final   Klebsiella aerogenes NOT DETECTED NOT DETECTED Final   Klebsiella oxytoca NOT DETECTED NOT DETECTED Final   Klebsiella pneumoniae NOT DETECTED NOT DETECTED Final   Proteus species NOT DETECTED NOT DETECTED Final   Salmonella species NOT DETECTED NOT DETECTED Final   Serratia marcescens NOT DETECTED NOT DETECTED Final   Haemophilus influenzae NOT DETECTED NOT DETECTED Final   Neisseria meningitidis NOT DETECTED NOT DETECTED Final   Pseudomonas aeruginosa NOT DETECTED NOT DETECTED Final   Stenotrophomonas maltophilia NOT DETECTED NOT DETECTED Final   Candida albicans NOT DETECTED NOT DETECTED Final   Candida auris NOT DETECTED NOT DETECTED Final   Candida glabrata NOT DETECTED NOT DETECTED Final   Candida krusei NOT DETECTED NOT DETECTED Final   Candida parapsilosis NOT DETECTED NOT DETECTED Final   Candida  tropicalis NOT DETECTED NOT DETECTED Final   Cryptococcus neoformans/gattii NOT DETECTED NOT DETECTED Final   Meth resistant mecA/C and MREJ NOT DETECTED NOT DETECTED Final    Comment: Performed at [facility], [address], [place], [place] [address]  Urine Culture     Status: Abnormal   Collection Time: 04/15/23  3:49 AM   Specimen: Urine, Clean Catch  Result Value Ref Range Status   Specimen Description URINE, CLEAN CATCH  Final   Special Requests   Final    NONE Performed at [facility], [address], [place], [place] [address]    Culture (A)  Final    70,000 COLONIES/mL KLEBSIELLA PNEUMONIAE 50,000 COLONIES/mL KLEBSIELLA PNEUMONIAE    Report Status 04/17/2023 FINAL  Final   Organism ID, Bacteria KLEBSIELLA PNEUMONIAE (A)  Final   Organism ID, Bacteria KLEBSIELLA PNEUMONIAE (A)  Final      Susceptibility   Klebsiella pneumoniae - MIC*    AMPICILLIN  >=32 RESISTANT Resistant     CEFAZOLIN  <=4 SENSITIVE Sensitive     CEFEPIME  <=0.12 SENSITIVE Sensitive     CEFTRIAXONE <=0.25 SENSITIVE Sensitive     CIPROFLOXACIN <=0.25 SENSITIVE Sensitive     GENTAMICIN  <=1 SENSITIVE Sensitive     IMIPENEM <=0.25 SENSITIVE Sensitive     NITROFURANTOIN 64 INTERMEDIATE Intermediate     TRIMETH/SULFA <=20 SENSITIVE Sensitive     AMPICILLIN /SULBACTAM >=32 RESISTANT Resistant     PIP/TAZO 8 SENSITIVE Sensitive ug/mL   Klebsiella pneumoniae - MIC*    AMPICILLIN  >=32 RESISTANT Resistant     CEFAZOLIN  <=4 SENSITIVE Sensitive     CEFEPIME  <=0.12 SENSITIVE Sensitive     CEFTRIAXONE <=0.25 SENSITIVE Sensitive     CIPROFLOXACIN <=0.25 SENSITIVE Sensitive     GENTAMICIN  <=1 SENSITIVE Sensitive     IMIPENEM <=0.25 SENSITIVE Sensitive     NITROFURANTOIN <=16 SENSITIVE Sensitive     TRIMETH/SULFA <=20 SENSITIVE Sensitive     AMPICILLIN /SULBACTAM >=32 RESISTANT Resistant     PIP/TAZO 16 SENSITIVE Sensitive ug/mL    * 70,000 COLONIES/mL KLEBSIELLA PNEUMONIAE    50,000 COLONIES/mL  KLEBSIELLA PNEUMONIAE  Culture, blood (Routine X 2) w Reflex to ID Panel     Status: None (Preliminary result)   Collection Time: 04/17/23  6:55 AM   Specimen: BLOOD  RIGHT HAND  Result Value Ref Range Status   Specimen Description BLOOD RIGHT HAND  Final   Special Requests   Final    BOTTLES DRAWN AEROBIC AND ANAEROBIC Blood Culture results may not be optimal due to an inadequate volume of blood received in culture bottles   Culture   Final    NO GROWTH 3 DAYS Performed at [facility], [address], [place], [place] [address]    Report Status PENDING  Incomplete  Culture, blood (Routine X 2) w Reflex to ID Panel     Status: None (Preliminary result)   Collection Time: 04/17/23  6:59 AM   Specimen: BLOOD RIGHT ARM  Result Value Ref Range Status   Specimen Description BLOOD RIGHT ARM  Final   Special Requests   Final    BOTTLES DRAWN AEROBIC AND ANAEROBIC Blood Culture results may not be optimal due to an inadequate volume of blood received in culture bottles   Culture   Final    NO GROWTH 3 DAYS Performed at [facility], [address], [place], [place] [address]    Report Status PENDING  Incomplete  Surgical pcr screen     Status: Abnormal   Collection Time: 04/18/23  4:53 AM   Specimen: Nasal Mucosa; Nasal Swab  Result Value Ref Range Status   MRSA, PCR NEGATIVE NEGATIVE Final   Staphylococcus aureus POSITIVE (A) NEGATIVE Final    Comment: (NOTE) The Xpert SA Assay (FDA approved for NASAL specimens in patients 54 years of age and older), is one component of a comprehensive surveillance program. It is not intended to diagnose infection nor to guide or monitor treatment. Performed at [facility], [address], [place], [place] [address]   Aerobic/Anaerobic Culture w Gram Stain (surgical/deep wound)     Status: None (Preliminary result)   Collection Time: 04/18/23  8:07 AM   Specimen: PATH Digit amputation; Tissue  Result Value Ref Range Status    Specimen Description TISSUE  Final   Special Requests left 2nd toe  Final   Gram Stain   Final    RARE WBC PRESENT, PREDOMINANTLY PMN RARE GRAM POSITIVE COCCI IN PAIRS Performed at [facility], [address] [place], [place] [address]    Culture   Final    FEW STAPHYLOCOCCUS AUREUS NO ANAEROBES ISOLATED; CULTURE IN PROGRESS FOR 5 DAYS    Report Status PENDING  Incomplete   Organism ID, Bacteria STAPHYLOCOCCUS AUREUS  Final      Susceptibility   Staphylococcus aureus - MIC*    CIPROFLOXACIN <=0.5 SENSITIVE Sensitive     ERYTHROMYCIN >=8 RESISTANT Resistant     GENTAMICIN  <=0.5 SENSITIVE Sensitive     OXACILLIN <=0.25 SENSITIVE Sensitive     TETRACYCLINE <=1 SENSITIVE Sensitive     VANCOMYCIN  <=0.5 SENSITIVE Sensitive     TRIMETH/SULFA 160 RESISTANT Resistant     CLINDAMYCIN <=0.25 SENSITIVE Sensitive     RIFAMPIN <=0.5 SENSITIVE Sensitive     Inducible Clindamycin NEGATIVE Sensitive     LINEZOLID 2 SENSITIVE Sensitive     * FEW STAPHYLOCOCCUS AUREUS     Radiology Studies: US  EKG SITE RITE Result Date: 04/19/2023 If Site Rite image not attached, placement could not be confirmed due to current cardiac rhythm.    Nilda Fendt, MD, PhD [facility]  Between 7 am - 7 pm I am available, please contact me via Amion (for emergencies) or Securechat (non urgent messages)  Between 7 pm -  7 am I am not available, please contact night coverage MD/APP via Amion

## 2023-04-20 NOTE — TOC Progression Note (Signed)
 Transition of Care [facility]) - Progression Note    Patient Details  Name: Adam Ponce MRN: [id number] Date of Birth: 28-Jun-1950  Transition of Care [facility]) CM/SW Contact  Montie LOISE Louder, [place] Phone Number: 04/20/2023, 4:00 PM  Clinical Narrative:     Received insurance shara for [name]  from 01/1-1/3 SNF updated anticipated d/c tomorrow if stable   Montie Louder, MSW, LCSW Clinical Social Worker    Expected Discharge Plan: Skilled Nursing Facility Barriers to Discharge: Continued Medical Work up  Expected Discharge Plan and Services In-house Referral: Clinical Social Work     Living arrangements for the past 2 months: Apartment                                       Social Determinants of Health (SDOH) Interventions SDOH Screenings   Food Insecurity: Food Insecurity Present (04/15/2023)  Housing: High Risk (04/15/2023)  Transportation Needs: Unmet Transportation Needs (04/15/2023)  Utilities: At Risk (04/15/2023)  Depression (PHQ2-9): Low Risk  (03/20/2020)  Recent Concern: Depression (PHQ2-9) - Medium Risk (01/10/2020)  Social Connections: Patient Declined (04/18/2023)  Tobacco Use: Medium Risk (04/18/2023)    Readmission Risk Interventions     No data to display

## 2023-04-21 ENCOUNTER — Encounter (HOSPITAL_COMMUNITY): Payer: Self-pay | Admitting: Internal Medicine

## 2023-04-21 DIAGNOSIS — M86172 Other acute osteomyelitis, left ankle and foot: Secondary | ICD-10-CM | POA: Diagnosis not present

## 2023-04-21 DIAGNOSIS — B9561 Methicillin susceptible Staphylococcus aureus infection as the cause of diseases classified elsewhere: Secondary | ICD-10-CM | POA: Diagnosis not present

## 2023-04-21 DIAGNOSIS — L089 Local infection of the skin and subcutaneous tissue, unspecified: Secondary | ICD-10-CM | POA: Diagnosis not present

## 2023-04-21 DIAGNOSIS — Z950 Presence of cardiac pacemaker: Secondary | ICD-10-CM

## 2023-04-21 DIAGNOSIS — Z89422 Acquired absence of other left toe(s): Secondary | ICD-10-CM

## 2023-04-21 DIAGNOSIS — E1169 Type 2 diabetes mellitus with other specified complication: Secondary | ICD-10-CM

## 2023-04-21 DIAGNOSIS — B20 Human immunodeficiency virus [HIV] disease: Secondary | ICD-10-CM

## 2023-04-21 DIAGNOSIS — E11628 Type 2 diabetes mellitus with other skin complications: Secondary | ICD-10-CM | POA: Diagnosis not present

## 2023-04-21 DIAGNOSIS — T827XXA Infection and inflammatory reaction due to other cardiac and vascular devices, implants and grafts, initial encounter: Secondary | ICD-10-CM | POA: Diagnosis present

## 2023-04-21 LAB — CBC
HCT: 32.7 % — ABNORMAL LOW (ref 39.0–52.0)
Hemoglobin: 11.2 g/dL — ABNORMAL LOW (ref 13.0–17.0)
MCH: 31.7 pg (ref 26.0–34.0)
MCHC: 34.3 g/dL (ref 30.0–36.0)
MCV: 92.6 fL (ref 80.0–100.0)
Platelets: 269 10*3/uL (ref 150–400)
RBC: 3.53 MIL/uL — ABNORMAL LOW (ref 4.22–5.81)
RDW: 14.6 % (ref 11.5–15.5)
WBC: 11.7 10*3/uL — ABNORMAL HIGH (ref 4.0–10.5)
nRBC: 0 % (ref 0.0–0.2)

## 2023-04-21 LAB — COMPREHENSIVE METABOLIC PANEL
ALT: 20 U/L (ref 0–44)
AST: 41 U/L (ref 15–41)
Albumin: 1.9 g/dL — ABNORMAL LOW (ref 3.5–5.0)
Alkaline Phosphatase: 78 U/L (ref 38–126)
Anion gap: 10 (ref 5–15)
BUN: 18 mg/dL (ref 8–23)
CO2: 25 mmol/L (ref 22–32)
Calcium: 8.2 mg/dL — ABNORMAL LOW (ref 8.9–10.3)
Chloride: 96 mmol/L — ABNORMAL LOW (ref 98–111)
Creatinine, Ser: 1.37 mg/dL — ABNORMAL HIGH (ref 0.61–1.24)
GFR, Estimated: 55 mL/min — ABNORMAL LOW (ref >60)
Glucose, Bld: 159 mg/dL — ABNORMAL HIGH (ref 70–99)
Potassium: 4.9 mmol/L (ref 3.5–5.1)
Sodium: 131 mmol/L — ABNORMAL LOW (ref 135–145)
Total Bilirubin: 0.9 mg/dL (ref 0.0–1.2)
Total Protein: 6.2 g/dL — ABNORMAL LOW (ref 6.5–8.1)

## 2023-04-21 LAB — GLUCOSE, CAPILLARY
Glucose-Capillary: 147 mg/dL — ABNORMAL HIGH (ref 70–99)
Glucose-Capillary: 161 mg/dL — ABNORMAL HIGH (ref 70–99)
Glucose-Capillary: 162 mg/dL — ABNORMAL HIGH (ref 70–99)
Glucose-Capillary: 176 mg/dL — ABNORMAL HIGH (ref 70–99)
Glucose-Capillary: 183 mg/dL — ABNORMAL HIGH (ref 70–99)
Glucose-Capillary: 190 mg/dL — ABNORMAL HIGH (ref 70–99)
Glucose-Capillary: 204 mg/dL — ABNORMAL HIGH (ref 70–99)

## 2023-04-21 LAB — BASIC METABOLIC PANEL
Anion gap: 9 (ref 5–15)
BUN: 18 mg/dL (ref 8–23)
CO2: 24 mmol/L (ref 22–32)
Calcium: 8.3 mg/dL — ABNORMAL LOW (ref 8.9–10.3)
Chloride: 95 mmol/L — ABNORMAL LOW (ref 98–111)
Creatinine, Ser: 1.37 mg/dL — ABNORMAL HIGH (ref 0.61–1.24)
GFR, Estimated: 55 mL/min — ABNORMAL LOW (ref >60)
Glucose, Bld: 186 mg/dL — ABNORMAL HIGH (ref 70–99)
Potassium: 4.5 mmol/L (ref 3.5–5.1)
Sodium: 128 mmol/L — ABNORMAL LOW (ref 135–145)

## 2023-04-21 LAB — SURGICAL PCR SCREEN
MRSA, PCR: NEGATIVE
Staphylococcus aureus: POSITIVE — AB

## 2023-04-21 MED ORDER — MUPIROCIN 2 % EX OINT
1.0000 | TOPICAL_OINTMENT | Freq: Two times a day (BID) | CUTANEOUS | Status: DC
  Administered 2023-04-21 – 2023-04-23 (×5): 1 via NASAL
  Filled 2023-04-21 (×3): qty 22

## 2023-04-21 MED ORDER — SODIUM CHLORIDE 0.9 % IV SOLN
3.0000 g | INTRAVENOUS | Status: DC
  Filled 2023-04-21: qty 3

## 2023-04-21 MED ORDER — SODIUM CHLORIDE 0.9 % IV SOLN: 3.0000 g | INTRAVENOUS | Status: DC

## 2023-04-21 MED ORDER — SODIUM CHLORIDE 0.9 % IV SOLN
80.0000 mg | INTRAVENOUS | Status: DC
  Filled 2023-04-21: qty 2

## 2023-04-21 MED ORDER — SODIUM CHLORIDE 0.9 % IV SOLN: 80.0000 mg | INTRAVENOUS | Status: DC

## 2023-04-21 MED ORDER — CHLORHEXIDINE GLUCONATE 4 % EX SOLN
60.0000 mL | Freq: Once | CUTANEOUS | Status: AC
  Administered 2023-04-21: 4 via TOPICAL
  Filled 2023-04-21: qty 60

## 2023-04-21 MED ORDER — SODIUM CHLORIDE 0.9 % IV BOLUS
500.0000 mL | Freq: Once | INTRAVENOUS | Status: AC
  Administered 2023-04-21: 500 mL via INTRAVENOUS

## 2023-04-21 NOTE — Progress Notes (Signed)
 Physical Therapy Treatment Patient Details Name: Adam Ponce MRN: [id number] DOB: [birth year] Today's Date: 04/21/2023   History of Present Illness 73 y.o. male presents to [facility] hospital on 04/14/2023 after a fall. Pt admitted with concern for L foot infection and rhabdomyolysis. He underwent L 2nd toe amp 04/18/23. PMH includes a flutter s/p PPM, DMII, toe amputation, HIV, HTN, HLD, GERD, COPD, CKD III, OSA, BPH.    PT Comments  Pt initially declining therapy due to going for surgery later, but with encouragement he showed that he was capable of standing within STEDY.  Emphasis on transitions to EOB, scooting, sitting balance, sit to stands in the STEDY with pre gait activity over all 3 standing trials.    If plan is discharge home, recommend the following: Two people to help with walking and/or transfers;Two people to help with bathing/dressing/bathroom;Assistance with cooking/housework;Assist for transportation;Help with stairs or ramp for entrance   Can travel by private vehicle     No  Equipment Recommendations  Other (comment) (TBD)    Recommendations for Other Services       Precautions / Restrictions Precautions Precautions: Fall Restrictions LLE Weight Bearing Per Provider Order: Partial weight bearing LLE Partial Weight Bearing Percentage or Pounds: heel WB in post op shoe for transfers only     Mobility  Bed Mobility Overal bed mobility: Needs Assistance Bed Mobility: Rolling     Supine to sit: Max assist, HOB elevated, Used rails Sit to supine: Max assist, Used rails   General bed mobility comments: pt was asked to come up R via R elbow with maximal assist of 2 persons with pivot to EOB and mod of 2 persons    Transfers Overall transfer level: Needs assistance Equipment used: Ambulation equipment used (STEDY) Transfers: Sit to/from Stand Sit to Stand: Mod assist, +2 physical assistance (into STEDY x3)           General transfer comment: pt returned to bed  due to bound for surgery in about 2 hours and wanting to stay in bed.  He completed 3 trials of standing in STEDY for about 1 min each, attaining full upright stance, with w/shifting and low amplitude movement of each foot on the foot plate.    Ambulation/Gait                   Stairs             Wheelchair Mobility     Tilt Bed    Modified Rankin (Stroke Patients Only)       Balance Overall balance assessment: Needs assistance Sitting-balance support: No upper extremity supported, Feet supported Sitting balance-Leahy Scale: Fair     Standing balance support: Bilateral upper extremity supported Standing balance-Leahy Scale: Poor Standing balance comment: pt reliant on external support or AD.  Completed 3 trials of standing in the STEDY                            Cognition Arousal: Alert Behavior During Therapy: WFL for tasks assessed/performed Overall Cognitive Status: Within Functional Limits for tasks assessed                                          Exercises Other Exercises Other Exercises: Warm up hip/knee flex/ext ROM bil with graded resistance x ~ 10 reps.    General Comments  General comments (skin integrity, edema, etc.): VSS on RA      Pertinent Vitals/Pain Pain Assessment Pain Assessment: Faces Faces Pain Scale: Hurts little more Pain Location: R LE with any movement Pain Descriptors / Indicators: Discomfort Pain Intervention(s): Monitored during session, Limited activity within patient's tolerance    Home Living                          Prior Function            PT Goals (current goals can now be found in the care plan section) Acute Rehab PT Goals Patient Stated Goal: to walk PT Goal Formulation: With patient Time For Goal Achievement: 04/30/23 Potential to Achieve Goals: Fair Progress towards PT goals: Progressing toward goals    Frequency    Min 1X/week      PT Plan       Co-evaluation              AM-PAC PT 6 Clicks Mobility   Outcome Measure  Help needed turning from your back to your side while in a flat bed without using bedrails?: A Lot Help needed moving from lying on your back to sitting on the side of a flat bed without using bedrails?: Total Help needed moving to and from a bed to a chair (including a wheelchair)?: Total Help needed standing up from a chair using your arms (e.g., wheelchair or bedside chair)?: Total Help needed to walk in hospital room?: Total Help needed climbing 3-5 steps with a railing? : Total 6 Click Score: 7    End of Session Equipment Utilized During Treatment: Gait belt Activity Tolerance: Patient tolerated treatment well;Patient limited by pain   Nurse Communication: Mobility status PT Visit Diagnosis: Other abnormalities of gait and mobility (R26.89);Pain Pain - Right/Left: Left Pain - part of body:  (foot)     Time: 8760-8693 PT Time Calculation (min) (ACUTE ONLY): 27 min  Charges:    $Therapeutic Activity: 23-37 mins PT General Charges $$ ACUTE PT VISIT: 1 Visit                     04/21/2023  India HERO., PT Acute Rehabilitation Services [phone]  (office)   Vinie GAILS Nuvia Hileman 04/21/2023, 4:59 PM

## 2023-04-21 NOTE — Progress Notes (Signed)
 Telemetry called RN to inform her of pt having wide QRS and an issue with pt's pacemaker and spiking. CCMD informed RN a strip is saved in the chart for review. NP Riddle notified of the received call and saved strip. No new orders received. P. Amo Delynn Olvera RN

## 2023-04-21 NOTE — Progress Notes (Addendum)
 PHARMACY CONSULT NOTE FOR:  OUTPATIENT  PARENTERAL ANTIBIOTIC THERAPY (OPAT)  Indication: MSSA bacteremia/osteo Regimen: Cefazolin  2g IV every 8 hours End date: 06/03/23 (6 weeks from PPM extraction 04/22/23)  IV antibiotic discharge orders are pended. To discharging provider:  please sign these orders via discharge navigator,  Select New Orders & click on the button choice - Manage This Unsigned Work.     Thank you for allowing pharmacy to be a part of this patient's care.  Con Laughter, PharmD PGY-2 Infectious Diseases Pharmacy Resident [facility] for Infectious Disease 04/21/2023 1:08 PM  **Pharmacist phone directory can now be found on [email or web address] ([email or web address]).  Listed under [facility].

## 2023-04-21 NOTE — Progress Notes (Signed)
  Patient Name: Adam Ponce Date of Encounter: 04/21/2023  Primary Cardiologist: None Electrophysiologist: None  Interval Summary   The patient is doing well today.  At this time, the patient denies chest pain, shortness of breath, or any new concerns.  Vital Signs    Vitals:   04/20/23 2000 04/20/23 2347 04/21/23 0333 04/21/23 0753  BP: 100/67 (!) 115/51 (!) 147/60 (!) 125/51  Pulse: 67 68 62 60  Resp: 17 15 20 18   Temp: 98.1 F (36.7 C) 98.3 F (36.8 C) 98.3 F (36.8 C) 99.8 F (37.7 C)  TempSrc: Oral Oral Oral Oral  SpO2: 94% 94% 93% 94%  Weight:      Height:        Intake/Output Summary (Last 24 hours) at 04/21/2023 0813 Last data filed at 04/21/2023 0333 Gross per 24 hour  Intake 450 ml  Output 1300 ml  Net -850 ml   Filed Weights   04/15/23 0354 04/18/23 0657  Weight: 126 kg 126 kg    Physical Exam    GEN- The patient is well appearing, alert and oriented x 3 today.   Lungs- Clear to ausculation bilaterally, normal work of breathing Cardiac- Regular rate and rhythm, no murmurs, rubs or gallops GI- soft, NT, ND, + BS Extremities- no clubbing or cyanosis. No edema  Telemetry    Sinus rhythm w periods of junctional rhythm, no bradycardia (personally reviewed)  Hospital Course    Adam Ponce is a 73 y.o. 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   Assessment & Plan    #) MSSA bacteremia #) osteomyelitis #) PPM in situ #) syncope Osteomyelitis and abscess of L foot 2nd toe, now s/p amputation of remaining 2nd toe on 12/20 > likely cause for bacteremia  Blood cultures positive for staph. Aureus, abx per ID TEE without vegetation Reprogrammed PPM to DDD 40bpm, tolerated well without bradycardia  Will plan for PPM removal today, NPO for procedure Reviewed risks/benefits for  procedure, patient verbalized understanding and wished to proceed No plans to implant temp wire at this time   #) HIV On descovy /prezcobix  Mgmt per ID  #) syncope No abnormal or arrhythmogenic cause on PPM interrogation  #) Aflutter Warfarin previously discontinued by PCP d/t no events on PPM No flutter on tele         For questions or updates, please contact [facility] Please consult [email or web address] for contact info under Cardiology/STEMI.  Signed, Chantal Needle, NP  04/21/2023, 8:13 AM

## 2023-04-21 NOTE — Progress Notes (Signed)
 Due to mechanical/equipment issue in the cath lab, procedure will need to be postponed to tomorrow. NPO after MN  Francis Dowse, PA-C

## 2023-04-21 NOTE — Progress Notes (Signed)
 PROGRESS NOTE  Adam Ponce FMW:[id number] DOB: [birth year] DOA: 04/14/2023 PCP: System, Provider Not In   LOS: 6 days   Brief Narrative / Interim history: Adam Ponce is a 73 y.o. male with medical history significant of atrial flutter status post PPM on Coumadin, insulin -dependent type 2 diabetes, peripheral neuropathy, history of toe amputations, HIV, hypertension, hyperlipidemia, GERD, asthma/COPD, CKD stage IIIa, OSA, ILD, BPH presented to the ED after he had a fall due to generalized weakness - found down by maintenance at facility who called EMS -reports weakness after using bathroom with syncopal event - unknown downtime.   Subjective / 24h Interval events: Complains of hiccups today, tells me is related to his reflux.  Pain is controlled.  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  ID and podiatry were consulted. He is status post amputation of the remaining second toe on 04/08/2023.  Intraoperative cultures showing MSSA. On Ancef  -TEE 12/31 without vegetation on the pacemaker leads, no evidence of endocarditis -EP Cardiology consulted and evaluated patient, he appears not to be pacemaker dependent with plans to remove the pacemaker later today -Needs SNF afterwards, final antibiotic regimen per ID with plan for 4 weeks of IV cefazolin , likely start date today given removal of the pacemaker.  Has a PICC line in place.  If cleared by cardiology after the pacemaker removal today, can go  to SNF as early as tomorrow.  He does have insurance authorization  Active problems Rhabdomyolysis -due to being down, CK improving  AKI on CKD stage IIIa -Creatinine downtrending appropriately with supportive care, most recent creatinine ratio was 1.3 in 2020.  Creatinine was as high as 2.34 on admission, and currently at baseline and stable over the last 2 days   Heart failure, ruled out -Echo EF 50 to 55% low normal left ventricular function without wall motion abnormalities, without diastolic dysfunction    Mild hyponatremia -possibly dehydrated with hypochloremia, patient tells me he has not been able to eat or drink due to several times being n.p.o. in the past several days, including this morning.  I will give 500 cc of fluids today   Syncope - Likely vasovagal event. CT head negative for acute intracranial abnormality and no focal neurodeficit on exam. PE less likely given no tachycardia, chest pain, or hypoxia.    Generalized weakness - PT/OT eval, fall precautions.   Atrial flutter - Has a pacemaker.  per patient he was taken off coumadin by his PCP at the [facility].    Iddm, uncontrolled with hyperglycemia -SSI and long acting -continue hypoglycemic protocol. A1c 8.6  Lab Results  Component Value Date   HGBA1C 8.6 (H) 04/15/2023   CBG (last 3)  Recent Labs    04/20/23 2345 04/21/23 0331 04/21/23 0801  GLUCAP 139* 183* 162*    Hypertension -resume meds as necessary, remains normotensive   Hyperlipidemia - Hold statin at this time in the setting of rhabdomyolysis.   HIV -continue home Descovy /prezcobix   Asthma/COPD - Stable,  no signs of acute exacerbation.  ILD - stable/asymptomatic  BPH - stable asymptomatic  Scheduled Meds:  acetaminophen   1,000 mg Oral QID   azelastine   2 spray Each Nare BID   Chlorhexidine  Gluconate Cloth  6 each Topical Daily   darunavir -cobicistat   1 tablet Oral Q breakfast   emtricitabine -tenofovir  AF  1 tablet Oral Daily   gentamicin   (GARAMYCIN ) 80 mg in sodium chloride  0.9 % 500 mL irrigation  80 mg Irrigation On Call   insulin  aspart  0-9 Units Subcutaneous Q4H   insulin  aspart  3 Units Subcutaneous TID WC   insulin  glargine-yfgn  15 Units Subcutaneous QHS   pantoprazole   40 mg Oral Daily   sodium chloride  flush  10-40 mL Intracatheter Q12H   Continuous Infusions:   ceFAZolin  (ANCEF ) IV      ceFAZolin  (ANCEF ) IV 2 g (04/21/23 0542)   PRN Meds:.albuterol , alum & mag hydroxide-simeth, oxyCODONE , sodium chloride  flush  Current Outpatient Medications  Medication Instructions   albuterol  (PROVENTIL  HFA;VENTOLIN  HFA) 108 (90 Base) MCG/ACT inhaler Every 6 hours PRN   Azelastine  HCl 137 MCG/SPRAY SOLN 2 sprays, As needed   bacitracin 500 UNIT/GM ointment 1 Application, 2 times daily   benzonatate (TESSALON) 100 mg, Every 8 hours PRN   betamethasone valerate (VALISONE) 0.1 % cream 2 times daily   Cholecalciferol (VITAMIN D3) 10000 units TABS Take by mouth.   cyanocobalamin (VITAMIN B12) 1,000 mcg,  Once   darunavir  (PREZISTA ) 800 mg   darunavir -cobicistat  (PREZCOBIX ) 800-150 MG tablet Oral   docusate sodium  (COLACE) 100 mg, 2 times daily   emtricitabine -tenofovir  AF (DESCOVY ) 200-25 MG tablet 1 tablet, Oral, Daily   ezetimibe (ZETIA) 10 mg, Daily   famotidine (PEPCID) 20 mg, 2 times daily   fenofibrate (TRICOR) 145 mg, Daily   glipiZIDE (GLUCOTROL) 10 mg, Daily before breakfast   guaiFENesin (MUCINEX) 600 mg, Every 12 hours PRN   hydrocerin (EUCERIN) CREA 1 Application, As needed   INSULIN  GLARGINE [place] 16 Units, Daily at bedtime   ipratropium (ATROVENT) 0.06 % nasal spray 2 sprays, 3 times daily PRN   metFORMIN (GLUCOPHAGE) 500 mg, 2 times daily with meals   pravastatin (PRAVACHOL) 80 mg, Daily   pregabalin  (LYRICA ) 100 mg, 2 times daily   ritonavir (NORVIR) 100 MG TABS tablet Take by mouth.   Semaglutide (2 MG/DOSE) 2 mg, Subcutaneous, Weekly, Inject on Thursday   sennosides-docusate sodium  (SENOKOT-S) 8.6-50 MG  tablet 1 tablet, Daily   sodium chloride  (OCEAN) 0.65 % nasal spray 1 spray, 4 times daily PRN   terazosin (HYTRIN) 2 MG capsule 1 capsule, Oral, Daily at bedtime   traMADol  (ULTRAM ) 50 MG tablet Every 6 hours PRN   triamcinolone cream (KENALOG) 0.1 % 1 Application, Daily PRN   valACYclovir (VALTREX) 1,000 mg, 2 times daily    Diet Orders (From admission, onward)     Start     Ordered   04/21/23 0001  Diet NPO time specified  Diet effective midnight        04/20/23 1726            DVT prophylaxis: Place and maintain sequential compression device Start: 04/21/23 0654   Lab Results  Component Value Date   PLT 227 04/18/2023      Code Status: Do not attempt resuscitation (DNR) PRE-ARREST INTERVENTIONS DESIRED  Family Communication: No family at bedside, discussed with sister over the phone  Status is: Inpatient Remains inpatient appropriate because: severity of illness  Level of care: Telemetry Medical  Consultants:  Podiatry ID  Objective: Vitals:   04/20/23 2000 04/20/23 2347 04/21/23 0333 04/21/23 0753  BP: 100/67 (!) 115/51 (!) 147/60 (!) 125/51  Pulse: 67 68 62 60  Resp: 17 15 20 18   Temp: 98.1 F (36.7 C) 98.3 F (36.8 C) 98.3 F (36.8 C) 99.8 F (37.7 C)  TempSrc: Oral Oral Oral Oral  SpO2: 94% 94% 93% 94%  Weight:      Height:        Intake/Output Summary (Last 24 hours) at 04/21/2023 1012 Last data filed at 04/21/2023 9666 Gross per 24 hour  Intake 450 ml  Output 1300 ml  Net -850 ml   Wt Readings from Last 3 Encounters:  04/18/23 126 kg  10/28/21 126.1 kg  03/18/20 127.3 kg    Examination: Constitutional: NAD Eyes: lids and conjunctivae normal, no scleral icterus ENMT: mmm Neck: normal, supple Respiratory: clear to auscultation bilaterally, no wheezing, no crackles. Normal respiratory effort.  Cardiovascular: Regular rate and rhythm, no murmurs / rubs / gallops. No LE edema. Abdomen: soft, no distention, no tenderness. Bowel sounds  positive.   Data Reviewed: I have independently reviewed following labs and imaging studies   CBC Recent Labs  Lab 04/14/23 2116 04/15/23 0107 04/16/23 0852 04/17/23 0655 04/18/23 0431  WBC 15.3* 15.4* 12.2* 11.0* 10.3  HGB 14.3 13.7 13.6 13.3 13.3  HCT 42.7 40.0 39.6 38.3* 38.5*  PLT 238 224 200 214 227  MCV 93.8 93.5 93.8 92.1 91.4  MCH 31.4 32.0 32.2 32.0 31.6  MCHC 33.5 34.3 34.3 34.7 34.5  RDW 14.3 14.4 13.9 13.8 13.6  LYMPHSABS 1.9  --   --   --   --   MONOABS 1.4*  --   --   --   --   EOSABS 0.0  --   --   --   --   BASOSABS 0.0  --   --   --   --     Recent Labs  Lab 04/14/23 2116 04/15/23 0107 04/15/23 0115 04/15/23 0302 04/16/23 0852 04/17/23 0655 04/18/23 0431 04/20/23 0500 04/21/23 0428  NA 133* 131*  --   --  131* 130* 131* 129* 128*  K 4.8 4.4  --   --  4.5 4.2 4.2 4.2 4.5  CL 100 98  --   --  100 98 96* 96* 95*  CO2 21* 19*  --   --  23 22 23 24 24   GLUCOSE 247* 266*  --   --  232* 211* 223* 173* 186*  BUN 25* 27*  --   --  19 19 18 19 18   CREATININE 2.34* 2.16*  --   --  1.86* 1.47* 1.45* 1.38* 1.37*  CALCIUM 9.8 9.3  --   --  8.9 8.6* 8.5* 8.2* 8.3*  AST 39  --   --   --   --   --   --   --   --   ALT 22  --   --   --   --   --   --   --   --   ALKPHOS 55  --   --   --   --   --   --   --   --   BILITOT 0.8  --   --   --   --   --   --   --   --   ALBUMIN 3.6  --   --   --   --   --   --   --   --  CRP  --  18.4*  --   --   --   --   --   --   --   LATICACIDVEN  --   --  1.3 1.3  --   --   --   --   --   INR  --  1.2  --   --   --   --   --   --   --   HGBA1C  --  8.6*  --   --   --   --   --   --   --   BNP  --  275.4*  --   --   --   --   --   --   --     ------------------------------------------------------------------------------------------------------------------ No results for input(s): CHOL, HDL, LDLCALC, TRIG, CHOLHDL, LDLDIRECT in the last 72 hours.  Lab Results  Component Value Date   HGBA1C 8.6 (H) 04/15/2023    ------------------------------------------------------------------------------------------------------------------ No results for input(s): TSH, T4TOTAL, T3FREE, THYROIDAB in the last 72 hours.  Invalid input(s): FREET3  Cardiac Enzymes No results for input(s): CKMB, TROPONINI, MYOGLOBIN in the last 168 hours.  Invalid input(s): CK ------------------------------------------------------------------------------------------------------------------    Component Value Date/Time   BNP 275.4 (H) 04/15/2023 0107    CBG: Recent Labs  Lab 04/20/23 1716 04/20/23 2003 04/20/23 2345 04/21/23 0331 04/21/23 0801  GLUCAP 183* 184* 139* 183* 162*    Recent Results (from the past 240 hours)  Blood Culture (routine x 2)     Status: None   Collection Time: 04/15/23  1:07 AM   Specimen: BLOOD RIGHT HAND  Result Value Ref Range Status   Specimen Description BLOOD RIGHT HAND  Final   Special Requests   Final    BOTTLES DRAWN AEROBIC AND ANAEROBIC Blood Culture adequate volume   Culture   Final    NO GROWTH 5 DAYS Performed at [facility], [address] [place], [place] [address]    Report Status 04/20/2023 FINAL  Final  Blood Culture (routine x 2)     Status: Abnormal   Collection Time: 04/15/23  1:07 AM   Specimen: BLOOD RIGHT HAND  Result Value Ref Range Status   Specimen Description BLOOD RIGHT HAND  Final   Special Requests   Final    BOTTLES DRAWN AEROBIC AND ANAEROBIC Blood Culture adequate volume   Culture  Setup Time   Final    GRAM POSITIVE COCCI ANAEROBIC BOTTLE ONLY CRITICAL RESULT CALLED TO, READ BACK BY AND VERIFIED WITH: PHARMD L. LAURENCE [id number] @ 2204 FH Performed at [facility], [address] [place], [place] [address]    Culture STAPHYLOCOCCUS AUREUS (A)  Final   Report Status 04/17/2023 FINAL  Final   Organism ID, Bacteria STAPHYLOCOCCUS AUREUS  Final      Susceptibility   Staphylococcus aureus - MIC*    CIPROFLOXACIN <=0.5  SENSITIVE Sensitive     ERYTHROMYCIN >=8 RESISTANT Resistant     GENTAMICIN  <=0.5 SENSITIVE Sensitive     OXACILLIN <=0.25 SENSITIVE Sensitive     TETRACYCLINE <=1 SENSITIVE Sensitive     VANCOMYCIN  <=0.5 SENSITIVE Sensitive     TRIMETH/SULFA 160 RESISTANT Resistant     CLINDAMYCIN <=0.25 SENSITIVE Sensitive     RIFAMPIN <=0.5 SENSITIVE Sensitive     Inducible Clindamycin NEGATIVE Sensitive     LINEZOLID 2 SENSITIVE Sensitive     * STAPHYLOCOCCUS AUREUS  Blood Culture ID Panel (Reflexed)     Status:  Abnormal   Collection Time: 04/15/23  1:07 AM  Result Value Ref Range Status   Enterococcus faecalis NOT DETECTED NOT DETECTED Final   Enterococcus Faecium NOT DETECTED NOT DETECTED Final   Listeria monocytogenes NOT DETECTED NOT DETECTED Final   Staphylococcus species DETECTED (A) NOT DETECTED Final    Comment: CRITICAL RESULT CALLED TO, READ BACK BY AND VERIFIED WITH: PHARMD L. CHEN [id number] @ 2204 FH    Staphylococcus aureus (BCID) DETECTED (A) NOT DETECTED Final    Comment: CRITICAL RESULT CALLED TO, READ BACK BY AND VERIFIED WITH: PHARMD L. LAURENCE [id number] @ 2204 FH    Staphylococcus epidermidis NOT DETECTED NOT DETECTED Final   Staphylococcus lugdunensis NOT DETECTED NOT DETECTED Final   Streptococcus species NOT DETECTED NOT DETECTED Final   Streptococcus agalactiae NOT DETECTED NOT DETECTED Final   Streptococcus pneumoniae NOT DETECTED NOT DETECTED Final   Streptococcus pyogenes NOT DETECTED NOT DETECTED Final   A.calcoaceticus-baumannii NOT DETECTED NOT DETECTED Final   Bacteroides fragilis NOT DETECTED NOT DETECTED Final   Enterobacterales NOT DETECTED NOT DETECTED Final   Enterobacter cloacae complex NOT DETECTED NOT DETECTED Final   Escherichia coli NOT DETECTED NOT DETECTED Final   Klebsiella aerogenes NOT DETECTED NOT DETECTED Final   Klebsiella oxytoca NOT DETECTED NOT DETECTED Final   Klebsiella pneumoniae NOT DETECTED NOT DETECTED Final   Proteus species NOT DETECTED NOT  DETECTED Final   Salmonella species NOT DETECTED NOT DETECTED Final   Serratia marcescens NOT DETECTED NOT DETECTED Final   Haemophilus influenzae NOT DETECTED NOT DETECTED Final   Neisseria meningitidis NOT DETECTED NOT DETECTED Final   Pseudomonas aeruginosa NOT DETECTED NOT DETECTED Final   Stenotrophomonas maltophilia NOT DETECTED NOT DETECTED Final   Candida albicans NOT DETECTED NOT DETECTED Final   Candida auris NOT DETECTED NOT DETECTED Final   Candida glabrata NOT DETECTED NOT DETECTED Final   Candida krusei NOT DETECTED NOT DETECTED Final   Candida parapsilosis NOT DETECTED NOT DETECTED Final   Candida tropicalis NOT DETECTED NOT DETECTED Final   Cryptococcus neoformans/gattii NOT DETECTED NOT DETECTED Final   Meth resistant mecA/C and MREJ NOT DETECTED NOT DETECTED Final    Comment: Performed at [facility], [address], [place], [place] [address]  Urine Culture     Status: Abnormal   Collection Time: 04/15/23  3:49 AM   Specimen: Urine, Clean Catch  Result Value Ref Range Status   Specimen Description URINE, CLEAN CATCH  Final   Special Requests   Final    NONE Performed at [facility], [address], [place], [place] [address]    Culture (A)  Final    70,000 COLONIES/mL KLEBSIELLA PNEUMONIAE 50,000 COLONIES/mL KLEBSIELLA PNEUMONIAE    Report Status 04/17/2023 FINAL  Final   Organism ID, Bacteria KLEBSIELLA PNEUMONIAE (A)  Final   Organism ID, Bacteria KLEBSIELLA PNEUMONIAE (A)  Final      Susceptibility   Klebsiella pneumoniae - MIC*    AMPICILLIN  >=32 RESISTANT Resistant     CEFAZOLIN  <=4 SENSITIVE Sensitive     CEFEPIME  <=0.12 SENSITIVE Sensitive     CEFTRIAXONE <=0.25 SENSITIVE Sensitive     CIPROFLOXACIN <=0.25 SENSITIVE Sensitive     GENTAMICIN  <=1 SENSITIVE Sensitive     IMIPENEM <=0.25 SENSITIVE Sensitive     NITROFURANTOIN 64 INTERMEDIATE Intermediate     TRIMETH/SULFA <=20 SENSITIVE Sensitive     AMPICILLIN /SULBACTAM >=32 RESISTANT  Resistant     PIP/TAZO 8 SENSITIVE Sensitive ug/mL   Klebsiella pneumoniae -  MIC*    AMPICILLIN  >=32 RESISTANT Resistant     CEFAZOLIN  <=4 SENSITIVE Sensitive     CEFEPIME  <=0.12 SENSITIVE Sensitive     CEFTRIAXONE <=0.25 SENSITIVE Sensitive     CIPROFLOXACIN <=0.25 SENSITIVE Sensitive     GENTAMICIN  <=1 SENSITIVE Sensitive     IMIPENEM <=0.25 SENSITIVE Sensitive     NITROFURANTOIN <=16 SENSITIVE Sensitive     TRIMETH/SULFA <=20 SENSITIVE Sensitive     AMPICILLIN /SULBACTAM >=32 RESISTANT Resistant     PIP/TAZO 16 SENSITIVE Sensitive ug/mL    * 70,000 COLONIES/mL KLEBSIELLA PNEUMONIAE    50,000 COLONIES/mL KLEBSIELLA PNEUMONIAE  Culture, blood (Routine X 2) w Reflex to ID Panel     Status: None (Preliminary result)   Collection Time: 04/17/23  6:55 AM   Specimen: BLOOD RIGHT HAND  Result Value Ref Range Status   Specimen Description BLOOD RIGHT HAND  Final   Special Requests   Final    BOTTLES DRAWN AEROBIC AND ANAEROBIC Blood Culture results may not be optimal due to an inadequate volume of blood received in culture bottles   Culture   Final    NO GROWTH 4 DAYS Performed at [facility], [address], [place], [place] [address]    Report Status PENDING  Incomplete  Culture, blood (Routine X 2) w Reflex to ID Panel     Status: None (Preliminary result)   Collection Time: 04/17/23  6:59 AM   Specimen: BLOOD RIGHT ARM  Result Value Ref Range Status   Specimen Description BLOOD RIGHT ARM  Final   Special Requests   Final    BOTTLES DRAWN AEROBIC AND ANAEROBIC Blood Culture results may not be optimal due to an inadequate volume of blood received in culture bottles   Culture   Final    NO GROWTH 4 DAYS Performed at [facility], [address] [place], [place] [address]    Report Status PENDING  Incomplete  Surgical pcr screen     Status: Abnormal   Collection Time: 04/18/23  4:53 AM   Specimen: Nasal Mucosa; Nasal Swab  Result Value Ref Range Status   MRSA, PCR  NEGATIVE NEGATIVE Final   Staphylococcus aureus POSITIVE (A) NEGATIVE Final    Comment: (NOTE) The Xpert SA Assay (FDA approved for NASAL specimens in patients 75 years of age and older), is one component of a comprehensive surveillance program. It is not intended to diagnose infection nor to guide or monitor treatment. Performed at [facility], [address], [place], [place] [address]   Aerobic/Anaerobic Culture w Gram Stain (surgical/deep wound)     Status: None (Preliminary result)   Collection Time: 04/18/23  8:07 AM   Specimen: PATH Digit amputation; Tissue  Result Value Ref Range Status   Specimen Description TISSUE  Final   Special Requests left 2nd toe  Final   Gram Stain   Final    RARE WBC PRESENT, PREDOMINANTLY PMN RARE GRAM POSITIVE COCCI IN PAIRS Performed at [facility], [address], [place], [place] [address]    Culture   Final    FEW STAPHYLOCOCCUS AUREUS NO ANAEROBES ISOLATED; CULTURE IN PROGRESS FOR 5 DAYS    Report Status PENDING  Incomplete   Organism ID, Bacteria STAPHYLOCOCCUS AUREUS  Final      Susceptibility   Staphylococcus aureus - MIC*    CIPROFLOXACIN <=0.5 SENSITIVE Sensitive     ERYTHROMYCIN >=8 RESISTANT Resistant     GENTAMICIN  <=0.5 SENSITIVE Sensitive  OXACILLIN <=0.25 SENSITIVE Sensitive     TETRACYCLINE <=1 SENSITIVE Sensitive     VANCOMYCIN  <=0.5 SENSITIVE Sensitive     TRIMETH/SULFA 160 RESISTANT Resistant     CLINDAMYCIN <=0.25 SENSITIVE Sensitive     RIFAMPIN <=0.5 SENSITIVE Sensitive     Inducible Clindamycin NEGATIVE Sensitive     LINEZOLID 2 SENSITIVE Sensitive     * FEW STAPHYLOCOCCUS AUREUS  Surgical PCR screen     Status: Abnormal   Collection Time: 04/21/23  7:52 AM   Specimen: Nasal Mucosa; Nasal Swab  Result Value Ref Range Status   MRSA, PCR NEGATIVE NEGATIVE Final   Staphylococcus aureus POSITIVE (A) NEGATIVE Final    Comment: (NOTE) The Xpert SA Assay (FDA approved for NASAL specimens in patients  90 years of age and older), is one component of a comprehensive surveillance program. It is not intended to diagnose infection nor to guide or monitor treatment. Performed at [facility], [address] [place], [place] [address]      Radiology Studies: No results found.    Nilda Fendt, MD, PhD [facility]  Between 7 am - 7 pm I am available, please contact me via Amion (for emergencies) or Securechat (non urgent messages)  Between 7 pm - 7 am I am not available, please contact night coverage MD/APP via Amion

## 2023-04-21 NOTE — TOC Progression Note (Signed)
 Transition of Care [facility]) - Progression Note    Patient Details  Name: Adam Ponce MRN: [id number] Date of Birth: 25-Apr-1950  Transition of Care [facility]) CM/SW Contact  Montie LOISE Louder, [place] Phone Number: 04/21/2023, 11:13 AM  Clinical Narrative:     Updated SNF patient not stable for d/c today- possible d/c tomorrow   Expected Discharge Plan: Skilled Nursing Facility Barriers to Discharge: Continued Medical Work up  Expected Discharge Plan and Services In-house Referral: Clinical Social Work     Living arrangements for the past 2 months: Apartment                                       Social Determinants of Health (SDOH) Interventions SDOH Screenings   Food Insecurity: Food Insecurity Present (04/15/2023)  Housing: High Risk (04/15/2023)  Transportation Needs: Unmet Transportation Needs (04/15/2023)  Utilities: At Risk (04/15/2023)  Depression (PHQ2-9): Low Risk  (03/20/2020)  Recent Concern: Depression (PHQ2-9) - Medium Risk (01/10/2020)  Social Connections: Patient Declined (04/18/2023)  Tobacco Use: Medium Risk (04/18/2023)    Readmission Risk Interventions     No data to display

## 2023-04-21 NOTE — Progress Notes (Signed)
 Subjective: No new complaints   Antibiotics:  Anti-infectives (From admission, onward)    Start     Dose/Rate Route Frequency Ordered Stop   04/21/23 0845  gentamicin  (GARAMYCIN ) 80 mg in sodium chloride  0.9 % 500 mL irrigation        80 mg Irrigation On call 04/21/23 0752 04/22/23 0845   04/21/23 0845  ceFAZolin  (ANCEF ) 3 g in sodium chloride  0.9 % 100 mL IVPB        3 g 200 mL/hr over 30 Minutes Intravenous On call 04/21/23 0752 04/22/23 0845   04/18/23 1400  ceFAZolin  (ANCEF ) IVPB 2g/100 mL premix        2 g 200 mL/hr over 30 Minutes Intravenous Every 8 hours 04/18/23 1141     04/17/23 0600  vancomycin  (VANCOCIN ) IVPB 1000 mg/200 mL premix  Status:  Discontinued        1,000 mg 200 mL/hr over 60 Minutes Intravenous Every 24 hours 04/16/23 1234 04/16/23 1516   04/16/23 2200  Ampicillin -Sulbactam (UNASYN ) 3 g in sodium chloride  0.9 % 100 mL IVPB  Status:  Discontinued        3 g 200 mL/hr over 30 Minutes Intravenous Every 6 hours 04/16/23 1516 04/18/23 1141   04/16/23 1800  vancomycin  (VANCOCIN ) IVPB 1000 mg/200 mL premix  Status:  Discontinued        1,000 mg 200 mL/hr over 60 Minutes Intravenous Every 12 hours 04/16/23 1233 04/16/23 1234   04/16/23 0600  vancomycin  (VANCOCIN ) IVPB 1000 mg/200 mL premix  Status:  Discontinued        1,000 mg 200 mL/hr over 60 Minutes Intravenous Every 24 hours 04/15/23 0437 04/16/23 1233   04/15/23 1200  ceFEPIme  (MAXIPIME ) 2 g in sodium chloride  0.9 % 100 mL IVPB  Status:  Discontinued        2 g 200 mL/hr over 30 Minutes Intravenous 2 times daily 04/15/23 0437 04/16/23 1516   04/15/23 1000  metroNIDAZOLE  (FLAGYL ) IVPB 500 mg  Status:  Discontinued        500 mg 100 mL/hr over 60 Minutes Intravenous 2 times daily 04/15/23 0410 04/16/23 1516   04/15/23 1000  emtricitabine -tenofovir  AF (DESCOVY ) 200-25 MG per tablet 1 tablet        1 tablet Oral Daily 04/15/23 0902     04/15/23 0915  darunavir -cobicistat  (PREZCOBIX ) 800-150 MG per  tablet 1 tablet        1 tablet Oral Daily with breakfast 04/15/23 0902     04/15/23 0445  vancomycin  (VANCOCIN ) IVPB 1000 mg/200 mL premix        1,000 mg 200 mL/hr over 60 Minutes Intravenous  Once 04/15/23 0433 04/15/23 0710   04/15/23 0015  vancomycin  (VANCOCIN ) IVPB 1000 mg/200 mL premix        1,000 mg 200 mL/hr over 60 Minutes Intravenous Every 1 hr x 2 04/15/23 0006 04/15/23 0229   04/15/23 0000  ceFEPIme  (MAXIPIME ) 2 g in sodium chloride  0.9 % 100 mL IVPB        2 g 200 mL/hr over 30 Minutes Intravenous  Once 04/14/23 2359 04/15/23 0344   04/15/23 0000  metroNIDAZOLE  (FLAGYL ) IVPB 500 mg        500 mg 100 mL/hr over 60 Minutes Intravenous  Once 04/14/23 2359 04/15/23 0445   04/15/23 0000  vancomycin  (VANCOCIN ) IVPB 1000 mg/200 mL premix  Status:  Discontinued        1,000 mg 200 mL/hr over 60 Minutes Intravenous  Once 04/14/23 2359 04/15/23 0005       Medications: Scheduled Meds:  acetaminophen   1,000 mg Oral QID   azelastine   2 spray Each Nare BID   Chlorhexidine  Gluconate Cloth  6 each Topical Daily   darunavir -cobicistat   1 tablet Oral Q breakfast   emtricitabine -tenofovir  AF  1 tablet Oral Daily   gentamicin  (GARAMYCIN ) 80 mg in sodium chloride  0.9 % 500 mL irrigation  80 mg Irrigation On Call   insulin  aspart  0-9 Units Subcutaneous Q4H   insulin  aspart  3 Units Subcutaneous TID WC   insulin  glargine-yfgn  15 Units Subcutaneous QHS   mupirocin  ointment  1 Application Nasal BID   pantoprazole   40 mg Oral Daily   sodium chloride  flush  10-40 mL Intracatheter Q12H   Continuous Infusions:   ceFAZolin  (ANCEF ) IV      ceFAZolin  (ANCEF ) IV 2 g (04/21/23 0542)   PRN Meds:.albuterol , alum & mag hydroxide-simeth, oxyCODONE , sodium chloride  flush    Objective: Weight change:   Intake/Output Summary (Last 24 hours) at 04/21/2023 1402 Last data filed at 04/21/2023 1134 Gross per 24 hour  Intake 450 ml  Output 1900 ml  Net -1450 ml   Blood pressure (!) 118/57, pulse  70, temperature 97.6 F (36.4 C), temperature source Oral, resp. rate 20, height 5' 11 (1.803 m), weight 126 kg, SpO2 95%. Temp:  [97.6 F (36.4 C)-99.8 F (37.7 C)] 97.6 F (36.4 C) (01/02 1133) Pulse Rate:  [60-70] 70 (01/02 1133) Resp:  [15-20] 20 (01/02 1133) BP: (100-155)/(51-67) 118/57 (01/02 1133) SpO2:  [91 %-95 %] 95 % (01/02 1133)  Physical Exam: Physical Exam Constitutional:      Appearance: He is well-developed.  HENT:     Head: Normocephalic and atraumatic.  Eyes:     Conjunctiva/sclera: Conjunctivae normal.  Cardiovascular:     Rate and Rhythm: Normal rate and regular rhythm.     Heart sounds: No murmur heard.    No friction rub. No gallop.  Pulmonary:     Effort: Pulmonary effort is normal. No respiratory distress.     Breath sounds: Normal breath sounds. No wheezing.  Abdominal:     General: There is no distension.     Palpations: Abdomen is soft.  Musculoskeletal:        General: Normal range of motion.     Cervical back: Normal range of motion and neck supple.  Skin:    General: Skin is warm and dry.     Findings: No erythema or rash.  Neurological:     General: No focal deficit present.     Mental Status: He is alert and oriented to person, place, and time.  Psychiatric:        Mood and Affect: Mood normal.        Behavior: Behavior normal.        Thought Content: Thought content normal.        Judgment: Judgment normal.      CBC:    BMET Recent Labs    04/21/23 0428 04/21/23 0940  NA 128* 131*  K 4.5 4.9  CL 95* 96*  CO2 24 25  GLUCOSE 186* 159*  BUN 18 18  CREATININE 1.37* 1.37*  CALCIUM 8.3* 8.2*     Liver Panel  Recent Labs    04/21/23 0940  PROT 6.2*  ALBUMIN 1.9*  AST 41  ALT 20  ALKPHOS 78  BILITOT 0.9       Sedimentation Rate No results for input(s):  ESRSEDRATE in the last 72 hours. C-Reactive Protein No results for input(s): CRP in the last 72 hours.  Micro Results: Recent Results (from the past  720 hours)  Blood Culture (routine x 2)     Status: None   Collection Time: 04/15/23  1:07 AM   Specimen: BLOOD RIGHT HAND  Result Value Ref Range Status   Specimen Description BLOOD RIGHT HAND  Final   Special Requests   Final    BOTTLES DRAWN AEROBIC AND ANAEROBIC Blood Culture adequate volume   Culture   Final    NO GROWTH 5 DAYS Performed at [facility], [address], [place], [place] [address]    Report Status 04/20/2023 FINAL  Final  Blood Culture (routine x 2)     Status: Abnormal   Collection Time: 04/15/23  1:07 AM   Specimen: BLOOD RIGHT HAND  Result Value Ref Range Status   Specimen Description BLOOD RIGHT HAND  Final   Special Requests   Final    BOTTLES DRAWN AEROBIC AND ANAEROBIC Blood Culture adequate volume   Culture  Setup Time   Final    GRAM POSITIVE COCCI ANAEROBIC BOTTLE ONLY CRITICAL RESULT CALLED TO, READ BACK BY AND VERIFIED WITH: PHARMD L. LAURENCE [id number] @ 2204 FH Performed at [facility], [address] [place], [place] [address]    Culture STAPHYLOCOCCUS AUREUS (A)  Final   Report Status 04/17/2023 FINAL  Final   Organism ID, Bacteria STAPHYLOCOCCUS AUREUS  Final      Susceptibility   Staphylococcus aureus - MIC*    CIPROFLOXACIN <=0.5 SENSITIVE Sensitive     ERYTHROMYCIN >=8 RESISTANT Resistant     GENTAMICIN  <=0.5 SENSITIVE Sensitive     OXACILLIN <=0.25 SENSITIVE Sensitive     TETRACYCLINE <=1 SENSITIVE Sensitive     VANCOMYCIN  <=0.5 SENSITIVE Sensitive     TRIMETH/SULFA 160 RESISTANT Resistant     CLINDAMYCIN <=0.25 SENSITIVE Sensitive     RIFAMPIN <=0.5 SENSITIVE Sensitive     Inducible Clindamycin NEGATIVE Sensitive     LINEZOLID 2 SENSITIVE Sensitive     * STAPHYLOCOCCUS AUREUS  Blood Culture ID Panel (Reflexed)     Status: Abnormal   Collection Time: 04/15/23  1:07 AM  Result Value Ref Range Status   Enterococcus faecalis NOT DETECTED NOT DETECTED Final   Enterococcus Faecium NOT DETECTED NOT DETECTED Final   Listeria  monocytogenes NOT DETECTED NOT DETECTED Final   Staphylococcus species DETECTED (A) NOT DETECTED Final    Comment: CRITICAL RESULT CALLED TO, READ BACK BY AND VERIFIED WITH: PHARMD L. CHEN [id number] @ 2204 FH    Staphylococcus aureus (BCID) DETECTED (A) NOT DETECTED Final    Comment: CRITICAL RESULT CALLED TO, READ BACK BY AND VERIFIED WITH: PHARMD L. LAURENCE [id number] @ 2204 FH    Staphylococcus epidermidis NOT DETECTED NOT DETECTED Final   Staphylococcus lugdunensis NOT DETECTED NOT DETECTED Final   Streptococcus species NOT DETECTED NOT DETECTED Final   Streptococcus agalactiae NOT DETECTED NOT DETECTED Final   Streptococcus pneumoniae NOT DETECTED NOT DETECTED Final   Streptococcus pyogenes NOT DETECTED NOT DETECTED Final   A.calcoaceticus-baumannii NOT DETECTED NOT DETECTED Final   Bacteroides fragilis NOT DETECTED NOT DETECTED Final   Enterobacterales NOT DETECTED NOT DETECTED Final   Enterobacter cloacae complex NOT DETECTED NOT DETECTED Final   Escherichia coli NOT DETECTED NOT DETECTED Final   Klebsiella aerogenes NOT DETECTED NOT DETECTED Final   Klebsiella oxytoca NOT DETECTED NOT DETECTED Final   Klebsiella  pneumoniae NOT DETECTED NOT DETECTED Final   Proteus species NOT DETECTED NOT DETECTED Final   Salmonella species NOT DETECTED NOT DETECTED Final   Serratia marcescens NOT DETECTED NOT DETECTED Final   Haemophilus influenzae NOT DETECTED NOT DETECTED Final   Neisseria meningitidis NOT DETECTED NOT DETECTED Final   Pseudomonas aeruginosa NOT DETECTED NOT DETECTED Final   Stenotrophomonas maltophilia NOT DETECTED NOT DETECTED Final   Candida albicans NOT DETECTED NOT DETECTED Final   Candida auris NOT DETECTED NOT DETECTED Final   Candida glabrata NOT DETECTED NOT DETECTED Final   Candida krusei NOT DETECTED NOT DETECTED Final   Candida parapsilosis NOT DETECTED NOT DETECTED Final   Candida tropicalis NOT DETECTED NOT DETECTED Final   Cryptococcus neoformans/gattii NOT  DETECTED NOT DETECTED Final   Meth resistant mecA/C and MREJ NOT DETECTED NOT DETECTED Final    Comment: Performed at [facility], [address], [place], [place] [address]  Urine Culture     Status: Abnormal   Collection Time: 04/15/23  3:49 AM   Specimen: Urine, Clean Catch  Result Value Ref Range Status   Specimen Description URINE, CLEAN CATCH  Final   Special Requests   Final    NONE Performed at [facility], [address] [place], [place] [address]    Culture (A)  Final    70,000 COLONIES/mL KLEBSIELLA PNEUMONIAE 50,000 COLONIES/mL KLEBSIELLA PNEUMONIAE    Report Status 04/17/2023 FINAL  Final   Organism ID, Bacteria KLEBSIELLA PNEUMONIAE (A)  Final   Organism ID, Bacteria KLEBSIELLA PNEUMONIAE (A)  Final      Susceptibility   Klebsiella pneumoniae - MIC*    AMPICILLIN  >=32 RESISTANT Resistant     CEFAZOLIN  <=4 SENSITIVE Sensitive     CEFEPIME  <=0.12 SENSITIVE Sensitive     CEFTRIAXONE <=0.25 SENSITIVE Sensitive     CIPROFLOXACIN <=0.25 SENSITIVE Sensitive     GENTAMICIN  <=1 SENSITIVE Sensitive     IMIPENEM <=0.25 SENSITIVE Sensitive     NITROFURANTOIN 64 INTERMEDIATE Intermediate     TRIMETH/SULFA <=20 SENSITIVE Sensitive     AMPICILLIN /SULBACTAM >=32 RESISTANT Resistant     PIP/TAZO 8 SENSITIVE Sensitive ug/mL   Klebsiella pneumoniae - MIC*    AMPICILLIN  >=32 RESISTANT Resistant     CEFAZOLIN  <=4 SENSITIVE Sensitive     CEFEPIME  <=0.12 SENSITIVE Sensitive     CEFTRIAXONE <=0.25 SENSITIVE Sensitive     CIPROFLOXACIN <=0.25 SENSITIVE Sensitive     GENTAMICIN  <=1 SENSITIVE Sensitive     IMIPENEM <=0.25 SENSITIVE Sensitive     NITROFURANTOIN <=16 SENSITIVE Sensitive     TRIMETH/SULFA <=20 SENSITIVE Sensitive     AMPICILLIN /SULBACTAM >=32 RESISTANT Resistant     PIP/TAZO 16 SENSITIVE Sensitive ug/mL    * 70,000 COLONIES/mL KLEBSIELLA PNEUMONIAE    50,000 COLONIES/mL KLEBSIELLA PNEUMONIAE  Culture, blood (Routine X 2) w Reflex to ID Panel     Status:  None (Preliminary result)   Collection Time: 04/17/23  6:55 AM   Specimen: BLOOD RIGHT HAND  Result Value Ref Range Status   Specimen Description BLOOD RIGHT HAND  Final   Special Requests   Final    BOTTLES DRAWN AEROBIC AND ANAEROBIC Blood Culture results may not be optimal due to an inadequate volume of blood received in culture bottles   Culture   Final    NO GROWTH 4 DAYS Performed at [facility], [address] [place], [place] [address]    Report Status PENDING  Incomplete  Culture, blood (Routine  X 2) w Reflex to ID Panel     Status: None (Preliminary result)   Collection Time: 04/17/23  6:59 AM   Specimen: BLOOD RIGHT ARM  Result Value Ref Range Status   Specimen Description BLOOD RIGHT ARM  Final   Special Requests   Final    BOTTLES DRAWN AEROBIC AND ANAEROBIC Blood Culture results may not be optimal due to an inadequate volume of blood received in culture bottles   Culture   Final    NO GROWTH 4 DAYS Performed at [facility], [address], [place], [place] [address]    Report Status PENDING  Incomplete  Surgical pcr screen     Status: Abnormal   Collection Time: 04/18/23  4:53 AM   Specimen: Nasal Mucosa; Nasal Swab  Result Value Ref Range Status   MRSA, PCR NEGATIVE NEGATIVE Final   Staphylococcus aureus POSITIVE (A) NEGATIVE Final    Comment: (NOTE) The Xpert SA Assay (FDA approved for NASAL specimens in patients 91 years of age and older), is one component of a comprehensive surveillance program. It is not intended to diagnose infection nor to guide or monitor treatment. Performed at [facility], [address] [place], [place] [address]   Aerobic/Anaerobic Culture w Gram Stain (surgical/deep wound)     Status: None (Preliminary result)   Collection Time: 04/18/23  8:07 AM   Specimen: PATH Digit amputation; Tissue  Result Value Ref Range Status   Specimen Description TISSUE  Final   Special Requests left 2nd toe  Final   Gram Stain    Final    RARE WBC PRESENT, PREDOMINANTLY PMN RARE GRAM POSITIVE COCCI IN PAIRS Performed at [facility], [address] [place], [place] [address]    Culture   Final    FEW STAPHYLOCOCCUS AUREUS NO ANAEROBES ISOLATED; CULTURE IN PROGRESS FOR 5 DAYS    Report Status PENDING  Incomplete   Organism ID, Bacteria STAPHYLOCOCCUS AUREUS  Final      Susceptibility   Staphylococcus aureus - MIC*    CIPROFLOXACIN <=0.5 SENSITIVE Sensitive     ERYTHROMYCIN >=8 RESISTANT Resistant     GENTAMICIN  <=0.5 SENSITIVE Sensitive     OXACILLIN <=0.25 SENSITIVE Sensitive     TETRACYCLINE <=1 SENSITIVE Sensitive     VANCOMYCIN  <=0.5 SENSITIVE Sensitive     TRIMETH/SULFA 160 RESISTANT Resistant     CLINDAMYCIN <=0.25 SENSITIVE Sensitive     RIFAMPIN <=0.5 SENSITIVE Sensitive     Inducible Clindamycin NEGATIVE Sensitive     LINEZOLID 2 SENSITIVE Sensitive     * FEW STAPHYLOCOCCUS AUREUS  Surgical PCR screen     Status: Abnormal   Collection Time: 04/21/23  7:52 AM   Specimen: Nasal Mucosa; Nasal Swab  Result Value Ref Range Status   MRSA, PCR NEGATIVE NEGATIVE Final   Staphylococcus aureus POSITIVE (A) NEGATIVE Final    Comment: (NOTE) The Xpert SA Assay (FDA approved for NASAL specimens in patients 59 years of age and older), is one component of a comprehensive surveillance program. It is not intended to diagnose infection nor to guide or monitor treatment. Performed at [facility], [address] [place], [place] [address]     Studies/Results: No results found.    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 male with with well controlled HIV on Prezcobix  and Descovy  via [place] in [place] who has comorbid left second toe osteomyelitis now status post amputation but unfortunate with MSSA bacteremia he has a pacemaker in place.  TEE was negative but of course the device will be infected with this organism being in the blood pacemaker extraction is planned for today.  Will plan on giving him 6 weeks of post pacemaker removal IV antibiotics  #1 MSSA bacteremia from osteomyelitis sp amputation and hopefully source control  PM to be removed which is undoubtedly infected   Diagnosis: MSSA bacteremia/osteo   Culture Result: MSSA  Allergies  Allergen Reactions   Ciprofloxacin Other (See Comments)    unknown   Ciprofloxacin-Dexamethasone  Other (See Comments)    unknown   Codeine     OPAT Orders Discharge antibiotics to be given via PICC line Discharge antibiotics: Cefazolin  2g IV every 8 hours indicated) Duration: 6 weeks End Date: 06/02/23   [facility] Care Per Protocol:  Home health RN for IV administration and teaching; PICC line care and labs.    Labs weekly while on IV antibiotics: _x_ CBC with differential _x_ BMP   _x_ Please pull PIC at completion of IV antibiotics __ Please leave PIC in place until doctor has seen patient or been notified  Fax weekly labs to [phone]  Clinic Follow Up Appt:   Lynwood FORBES Ada has an appointment on  at  [facility], which  is located in the [facility] at  [address] in [place].  [address], which is located to the left of the elevators.  Phone: [phone]  Fax: [phone]  [email or web address]  The patient should arrive 30 minutes prior to their appoitment.  #2 HIV disease  Well controlled  Continue Prezcobix  and Descovy   I have personally spent 52 minutes involved in face-to-face and  non-face-to-face activities for this patient on the day of the visit. Professional time spent includes the following activities: Preparing to see the patient (review of tests), Obtaining and/or reviewing separately obtained history (admission/discharge record), Performing a medically appropriate examination and/or evaluation , Ordering medications/tests/procedures, referring and communicating with other health care professionals, Documenting clinical information in the EMR, Independently interpreting results (not separately reported), Communicating results to the patient/family/caregiver, Counseling and educating the patient/family/caregiver and Care coordination (not separately reported).      LOS: 6 days   Jomarie Fleeta Rothman 04/21/2023, 2:02 PM

## 2023-04-22 ENCOUNTER — Other Ambulatory Visit: Payer: Self-pay

## 2023-04-22 ENCOUNTER — Inpatient Hospital Stay (HOSPITAL_COMMUNITY)
Admission: EM | Disposition: A | Discharge status: Skilled Nursing Facility | Payer: Self-pay | Source: Skilled Nursing Facility | Attending: Internal Medicine

## 2023-04-22 DIAGNOSIS — T827XXD Infection and inflammatory reaction due to other cardiac and vascular devices, implants and grafts, subsequent encounter: Secondary | ICD-10-CM | POA: Diagnosis not present

## 2023-04-22 DIAGNOSIS — B9561 Methicillin susceptible Staphylococcus aureus infection as the cause of diseases classified elsewhere: Secondary | ICD-10-CM | POA: Diagnosis not present

## 2023-04-22 DIAGNOSIS — T827XXA Infection and inflammatory reaction due to other cardiac and vascular devices, implants and grafts, initial encounter: Secondary | ICD-10-CM

## 2023-04-22 DIAGNOSIS — E1169 Type 2 diabetes mellitus with other specified complication: Secondary | ICD-10-CM | POA: Diagnosis not present

## 2023-04-22 DIAGNOSIS — M86172 Other acute osteomyelitis, left ankle and foot: Secondary | ICD-10-CM | POA: Diagnosis not present

## 2023-04-22 DIAGNOSIS — R7881 Bacteremia: Secondary | ICD-10-CM | POA: Diagnosis not present

## 2023-04-22 DIAGNOSIS — Z950 Presence of cardiac pacemaker: Secondary | ICD-10-CM | POA: Diagnosis not present

## 2023-04-22 DIAGNOSIS — M869 Osteomyelitis, unspecified: Secondary | ICD-10-CM | POA: Diagnosis not present

## 2023-04-22 HISTORY — PX: PPM GENERATOR REMOVAL: EP1234

## 2023-04-22 LAB — CULTURE, BLOOD (ROUTINE X 2)
Culture: NO GROWTH
Culture: NO GROWTH

## 2023-04-22 LAB — COMPREHENSIVE METABOLIC PANEL
ALT: 16 U/L (ref 0–44)
AST: 34 U/L (ref 15–41)
Albumin: 1.9 g/dL — ABNORMAL LOW (ref 3.5–5.0)
Alkaline Phosphatase: 70 U/L (ref 38–126)
Anion gap: 9 (ref 5–15)
BUN: 18 mg/dL (ref 8–23)
CO2: 25 mmol/L (ref 22–32)
Calcium: 8.3 mg/dL — ABNORMAL LOW (ref 8.9–10.3)
Chloride: 97 mmol/L — ABNORMAL LOW (ref 98–111)
Creatinine, Ser: 1.39 mg/dL — ABNORMAL HIGH (ref 0.61–1.24)
GFR, Estimated: 54 mL/min — ABNORMAL LOW (ref >60)
Glucose, Bld: 108 mg/dL — ABNORMAL HIGH (ref 70–99)
Potassium: 4.1 mmol/L (ref 3.5–5.1)
Sodium: 131 mmol/L — ABNORMAL LOW (ref 135–145)
Total Bilirubin: 0.5 mg/dL (ref 0.0–1.2)
Total Protein: 7 g/dL (ref 6.5–8.1)

## 2023-04-22 LAB — CBC
HCT: 32.1 % — ABNORMAL LOW (ref 39.0–52.0)
Hemoglobin: 11.2 g/dL — ABNORMAL LOW (ref 13.0–17.0)
MCH: 31.8 pg (ref 26.0–34.0)
MCHC: 34.9 g/dL (ref 30.0–36.0)
MCV: 91.2 fL (ref 80.0–100.0)
Platelets: 277 10*3/uL (ref 150–400)
RBC: 3.52 MIL/uL — ABNORMAL LOW (ref 4.22–5.81)
RDW: 14.8 % (ref 11.5–15.5)
WBC: 10.1 10*3/uL (ref 4.0–10.5)
nRBC: 0.2 % (ref 0.0–0.2)

## 2023-04-22 LAB — GLUCOSE, CAPILLARY
Glucose-Capillary: 117 mg/dL — ABNORMAL HIGH (ref 70–99)
Glucose-Capillary: 156 mg/dL — ABNORMAL HIGH (ref 70–99)
Glucose-Capillary: 160 mg/dL — ABNORMAL HIGH (ref 70–99)
Glucose-Capillary: 162 mg/dL — ABNORMAL HIGH (ref 70–99)
Glucose-Capillary: 169 mg/dL — ABNORMAL HIGH (ref 70–99)
Glucose-Capillary: 174 mg/dL — ABNORMAL HIGH (ref 70–99)
Glucose-Capillary: 96 mg/dL (ref 70–99)

## 2023-04-22 LAB — MAGNESIUM: Magnesium: 2.1 mg/dL (ref 1.7–2.4)

## 2023-04-22 SURGERY — PPM GENERATOR REMOVAL

## 2023-04-22 MED ORDER — SODIUM CHLORIDE 0.9 % IV SOLN: INTRAVENOUS | Status: DC

## 2023-04-22 MED ORDER — SODIUM CHLORIDE 0.9 % IV SOLN
INTRAVENOUS | Status: AC
  Filled 2023-04-22: qty 2

## 2023-04-22 MED ORDER — CHLORHEXIDINE GLUCONATE 4 % EX SOLN
60.0000 mL | Freq: Once | CUTANEOUS | Status: AC
  Administered 2023-04-22: 4 via TOPICAL

## 2023-04-22 MED ORDER — FENTANYL CITRATE (PF) 100 MCG/2ML IJ SOLN
INTRAMUSCULAR | Status: AC
  Filled 2023-04-22: qty 2

## 2023-04-22 MED ORDER — LIDOCAINE HCL (PF) 1 % IJ SOLN
INTRAMUSCULAR | Status: AC
  Filled 2023-04-22: qty 60

## 2023-04-22 MED ORDER — MIDAZOLAM HCL 5 MG/5ML IJ SOLN
INTRAMUSCULAR | Status: DC | PRN
  Administered 2023-04-22 (×3): 1 mg via INTRAVENOUS

## 2023-04-22 MED ORDER — MIDAZOLAM HCL 2 MG/2ML IJ SOLN
INTRAMUSCULAR | Status: AC
  Filled 2023-04-22: qty 2

## 2023-04-22 MED ORDER — FENTANYL CITRATE (PF) 100 MCG/2ML IJ SOLN
INTRAMUSCULAR | Status: DC | PRN
  Administered 2023-04-22 (×2): 25 ug via INTRAVENOUS

## 2023-04-22 MED ORDER — HEPARIN (PORCINE) IN NACL 1000-0.9 UT/500ML-% IV SOLN
INTRAVENOUS | Status: DC | PRN
  Administered 2023-04-22: 500 mL

## 2023-04-22 MED ORDER — LIDOCAINE HCL (PF) 1 % IJ SOLN
INTRAMUSCULAR | Status: DC | PRN
  Administered 2023-04-22: 60 mL

## 2023-04-22 MED ORDER — SODIUM CHLORIDE 0.9 % IV SOLN
80.0000 mg | INTRAVENOUS | Status: AC
  Administered 2023-04-22: 80 mg

## 2023-04-22 MED ORDER — SODIUM CHLORIDE 0.9 % IV SOLN
3.0000 g | INTRAVENOUS | Status: DC
  Filled 2023-04-22: qty 3

## 2023-04-22 SURGICAL SUPPLY — 4 items
CABLE SURGICAL S-101-97-12 (CABLE) ×1 IMPLANT
MAT PREVALON FULL STRYKER (MISCELLANEOUS) IMPLANT
PAD DEFIB RADIO PHYSIO CONN (PAD) ×1 IMPLANT
TRAY PACEMAKER INSERTION (PACKS) ×1 IMPLANT

## 2023-04-22 NOTE — Progress Notes (Signed)
  Subjective:  Patient ID: Adam Ponce, male    DOB: 1950-09-30,  MRN: [id number]  Chief Complaint  Patient presents with   Fall    DOS: 04/18/23 Procedure: Left 2nd toe amp at MPJ level  73 y.o. male seen for post op check. Pt seen about 4 days post amputation of remaining prox pahalnx from 2nd toe. Doing well, denies pain. Walking with shoe on with PT. No concerns.   Review of Systems: Negative except as noted in the HPI. Denies N/V/F/Ch.   Objective:   Vitals:   04/22/23 0940 04/22/23 0955  BP: 131/88 (!) 114/55  Pulse: 73 62  Resp: 18 19  Temp: 98.2 F (36.8 C) 98 F (36.7 C)  SpO2: 96% 94%   Body mass index is 38.74 kg/m. Constitutional Well developed. Well nourished.  Vascular Foot warm and well perfused. Capillary refill normal to all digits.   No calf pain with palpation  Neurologic Normal speech. Oriented to person, place, and time. Epicritic sensation absent to toes  Dermatologic Incision healing well with some eschar, appears viable with no residual infection at this time.   Orthopedic: Decreased edema left forefoot   Radiographs: s/p 2nd toe amp  Pathology: Acute OM of 2nd toe  Micro: MSSA  Assessment:   1. Severe sepsis (HCC)   2. Traumatic rhabdomyolysis, initial encounter (HCC)   3. AKI (acute kidney injury) (HCC)    Plan:  Patient was evaluated and treated and all questions answered.  POD # 4 s/p L 2nd toe amputation -Progressing well post op, amp site healing without acute complication. -XR: expected post op changes -WB Status: WBAT in post op shoe -Sutures: to remain intact. -Medications/ABX: Abx per ID, in IV abx due to MSSA bacteremia -Foot redressed. - Return in 7-10 days to follow up in office. Leave new dsg placed today until then. Will sign off at this time.         Marolyn JULIANNA Honour, DPM [facility] / [facility]

## 2023-04-22 NOTE — Progress Notes (Signed)
  Patient Name: Adam Ponce Date of Encounter: 04/22/2023  Primary Cardiologist: None Electrophysiologist: None  Interval Summary   The patient is doing well today.  At this time, the patient denies chest pain, shortness of breath, or any new concerns.  Vital Signs    Vitals:   04/21/23 1133 04/21/23 1507 04/21/23 2017 04/22/23 0327  BP: (!) 118/57 (!) 141/50 (!) 147/53   Pulse: 70 66 (!) 53   Resp: 20 19 20 20   Temp: 97.6 F (36.4 C) 98 F (36.7 C) 98.4 F (36.9 C) 98.6 F (37 C)  TempSrc: Oral Oral Oral Oral  SpO2: 95% 92% 97%   Weight:      Height:        Intake/Output Summary (Last 24 hours) at 04/22/2023 0711 Last data filed at 04/22/2023 0415 Gross per 24 hour  Intake 510 ml  Output 1500 ml  Net -990 ml   Filed Weights   04/15/23 0354 04/18/23 0657  Weight: 126 kg 126 kg    Physical Exam    GEN- The patient is well appearing, alert and oriented x 3 today.   Lungs- Clear to ausculation bilaterally, normal work of breathing Cardiac- Regular rate and rhythm, no murmurs, rubs or gallops GI- soft, NT, ND, + BS Extremities- no clubbing or cyanosis.   Telemetry    Sinus rhythm w periods of junctional rhythm, no bradycardia (personally reviewed)  Hospital Course    Adam Ponce is a 73 y.o. 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   Assessment & Plan    #) MSSA bacteremia #) osteomyelitis #) PPM in situ #) syncope Osteomyelitis and abscess of L foot 2nd toe, now s/p amputation of remaining 2nd toe on 12/20 > likely cause for bacteremia  Blood cultures positive for staph. Aureus, abx per ID TEE without vegetation Reprogrammed PPM to DDD 40bpm, tolerated well without bradycardia  Will plan for PPM removal today, NPO for procedure  I discussed the indication for the procedure  and the logistics, risks, potential benefit, and after care. I specifically explained that risks include but are not limited to infection, bleeding,damage to blood vessels, lung, and the heart -- but risk of prolonged hospitalization, need for surgery, or the event of stroke, heart attack, or death are low but not zero. Additionally, there is risk associated with low heart rates without the pacemaker -- passing out, weakness, dizziness, low but not zero risk of death.  No plans to implant temp wire at this time   #) HIV On descovy /prezcobix  Mgmt per ID  #) syncope No abnormal or arrhythmogenic cause on PPM interrogation  #) Aflutter Warfarin previously discontinued by PCP d/t no events on PPM No flutter on tele         For questions or updates, please contact [facility] Please consult [email or web address] for contact info under Cardiology/STEMI.  Signed, Eulas FORBES Furbish, MD  04/22/2023, 7:11 AM

## 2023-04-22 NOTE — Progress Notes (Signed)
 Pt off unit to cath lab for a procedure. Dionne Bucy RN

## 2023-04-22 NOTE — TOC Progression Note (Signed)
 Transition of Care [facility]) - Progression Note    Patient Details  Name: Adam Ponce MRN: [id number] Date of Birth: [birth year]  Transition of Care [facility]) CM/SW Contact  Montie LOISE Louder, [place] Phone Number: 04/22/2023, 5:17 PM  Clinical Narrative:     Patient shara for [name] 1/1-1/3- shara expires today  Weekend CSW will have to follow up to determine if re-auth is needed before the patient can d/c to SNF.  Montie Louder, MSW, LCSW Clinical Social Worker    Expected Discharge Plan: Skilled Nursing Facility Barriers to Discharge: Continued Medical Work up  Expected Discharge Plan and Services In-house Referral: Clinical Social Work     Living arrangements for the past 2 months: Apartment                                       Social Determinants of Health (SDOH) Interventions SDOH Screenings   Food Insecurity: Food Insecurity Present (04/15/2023)  Housing: High Risk (04/15/2023)  Transportation Needs: Unmet Transportation Needs (04/15/2023)  Utilities: At Risk (04/15/2023)  Depression (PHQ2-9): Low Risk  (03/20/2020)  Recent Concern: Depression (PHQ2-9) - Medium Risk (01/10/2020)  Social Connections: Patient Declined (04/18/2023)  Tobacco Use: Medium Risk (04/18/2023)    Readmission Risk Interventions     No data to display

## 2023-04-22 NOTE — Progress Notes (Signed)
 Pt back from cath lab, upper left chest dsg clean, dry and intact. No bleeding or stain noted. Pt alert and verbally responsive, VSS. MYRTIS Gong Jemina Scahill RN   04/22/23 0926  Vitals  Temp 99.1 F (37.3 C)  Temp Source Oral  BP (!) 130/42  MAP (mmHg) 67  BP Location Left Arm  BP Method Automatic  Patient Position (if appropriate) Lying  Pulse Rate 60  Pulse Rate Source Monitor  ECG Heart Rate 62  Resp 20  MEWS COLOR  MEWS Score Color Green  Oxygen Therapy  SpO2 93 %  O2 Device Room Air  MEWS Score  MEWS Temp 0  MEWS Systolic 0  MEWS Pulse 0  MEWS RR 0  MEWS LOC 0  MEWS Score 0

## 2023-04-22 NOTE — Progress Notes (Signed)
 Subjective: No new complaints   Antibiotics:  Anti-infectives (From admission, onward)    Start     Dose/Rate Route Frequency Ordered Stop   04/22/23 1200  gentamicin  (GARAMYCIN ) 80 mg in sodium chloride  0.9 % 500 mL irrigation  Status:  Discontinued        80 mg Irrigation To Cath Lab 04/21/23 1412 04/22/23 0925   04/22/23 0900  gentamicin  (GARAMYCIN ) 80 mg in sodium chloride  0.9 % 500 mL irrigation        80 mg Irrigation On call 04/22/23 0347 04/22/23 0852   04/22/23 0900  ceFAZolin  (ANCEF ) 3 g in sodium chloride  0.9 % 100 mL IVPB  Status:  Discontinued        3 g 200 mL/hr over 30 Minutes Intravenous On call 04/22/23 0347 04/22/23 0925   04/22/23 0730  gentamicin  (GARAMYCIN ) 80 mg in sodium chloride  0.9 % 500 mL irrigation  Status:  Discontinued        80 mg Irrigation On call 04/21/23 1404 04/21/23 1412   04/22/23 0730  ceFAZolin  (ANCEF ) 3 g in sodium chloride  0.9 % 100 mL IVPB  Status:  Discontinued        3 g 200 mL/hr over 30 Minutes Intravenous On call 04/21/23 1407 04/21/23 1411   04/22/23 0722  sodium chloride  0.9 % with gentamicin  (GARAMYCIN ) ADS Med       Note to Pharmacy: Dionisio Grate A: cabinet override      04/22/23 0722 04/22/23 0946   04/21/23 0845  gentamicin  (GARAMYCIN ) 80 mg in sodium chloride  0.9 % 500 mL irrigation  Status:  Discontinued        80 mg Irrigation On call 04/21/23 0752 04/21/23 1404   04/21/23 0845  ceFAZolin  (ANCEF ) 3 g in sodium chloride  0.9 % 100 mL IVPB  Status:  Discontinued        3 g 200 mL/hr over 30 Minutes Intravenous On call 04/21/23 0752 04/21/23 1407   04/18/23 1400  ceFAZolin  (ANCEF ) IVPB 2g/100 mL premix        2 g 200 mL/hr over 30 Minutes Intravenous Every 8 hours 04/18/23 1141     04/17/23 0600  vancomycin  (VANCOCIN ) IVPB 1000 mg/200 mL premix  Status:  Discontinued        1,000 mg 200 mL/hr over 60 Minutes Intravenous Every 24 hours 04/16/23 1234 04/16/23 1516   04/16/23 2200  Ampicillin -Sulbactam (UNASYN ) 3  g in sodium chloride  0.9 % 100 mL IVPB  Status:  Discontinued        3 g 200 mL/hr over 30 Minutes Intravenous Every 6 hours 04/16/23 1516 04/18/23 1141   04/16/23 1800  vancomycin  (VANCOCIN ) IVPB 1000 mg/200 mL premix  Status:  Discontinued        1,000 mg 200 mL/hr over 60 Minutes Intravenous Every 12 hours 04/16/23 1233 04/16/23 1234   04/16/23 0600  vancomycin  (VANCOCIN ) IVPB 1000 mg/200 mL premix  Status:  Discontinued        1,000 mg 200 mL/hr over 60 Minutes Intravenous Every 24 hours 04/15/23 0437 04/16/23 1233   04/15/23 1200  ceFEPIme  (MAXIPIME ) 2 g in sodium chloride  0.9 % 100 mL IVPB  Status:  Discontinued        2 g 200 mL/hr over 30 Minutes Intravenous 2 times daily 04/15/23 0437 04/16/23 1516   04/15/23 1000  metroNIDAZOLE  (FLAGYL ) IVPB 500 mg  Status:  Discontinued        500 mg 100 mL/hr over 60  Minutes Intravenous 2 times daily 04/15/23 0410 04/16/23 1516   04/15/23 1000  emtricitabine -tenofovir  AF (DESCOVY ) 200-25 MG per tablet 1 tablet        1 tablet Oral Daily 04/15/23 0902     04/15/23 0915  darunavir -cobicistat  (PREZCOBIX ) 800-150 MG per tablet 1 tablet        1 tablet Oral Daily with breakfast 04/15/23 0902     04/15/23 0445  vancomycin  (VANCOCIN ) IVPB 1000 mg/200 mL premix        1,000 mg 200 mL/hr over 60 Minutes Intravenous  Once 04/15/23 0433 04/15/23 0710   04/15/23 0015  vancomycin  (VANCOCIN ) IVPB 1000 mg/200 mL premix        1,000 mg 200 mL/hr over 60 Minutes Intravenous Every 1 hr x 2 04/15/23 0006 04/15/23 0229   04/15/23 0000  ceFEPIme  (MAXIPIME ) 2 g in sodium chloride  0.9 % 100 mL IVPB        2 g 200 mL/hr over 30 Minutes Intravenous  Once 04/14/23 2359 04/15/23 0344   04/15/23 0000  metroNIDAZOLE  (FLAGYL ) IVPB 500 mg        500 mg 100 mL/hr over 60 Minutes Intravenous  Once 04/14/23 2359 04/15/23 0445   04/15/23 0000  vancomycin  (VANCOCIN ) IVPB 1000 mg/200 mL premix  Status:  Discontinued        1,000 mg 200 mL/hr over 60 Minutes Intravenous  Once  04/14/23 2359 04/15/23 0005       Medications: Scheduled Meds:  acetaminophen   1,000 mg Oral QID   azelastine   2 spray Each Nare BID   Chlorhexidine  Gluconate Cloth  6 each Topical Daily   darunavir -cobicistat   1 tablet Oral Q breakfast   emtricitabine -tenofovir  AF  1 tablet Oral Daily   insulin  aspart  0-9 Units Subcutaneous Q4H   insulin  aspart  3 Units Subcutaneous TID WC   insulin  glargine-yfgn  15 Units Subcutaneous QHS   mupirocin  ointment  1 Application Nasal BID   pantoprazole   40 mg Oral Daily   sodium chloride  flush  10-40 mL Intracatheter Q12H   Continuous Infusions:   ceFAZolin  (ANCEF ) IV 2 g (04/22/23 0601)   PRN Meds:.albuterol , alum & mag hydroxide-simeth, oxyCODONE , sodium chloride  flush    Objective: Weight change:   Intake/Output Summary (Last 24 hours) at 04/22/2023 1241 Last data filed at 04/22/2023 0947 Gross per 24 hour  Intake 530 ml  Output 1200 ml  Net -670 ml   Blood pressure (!) 128/56, pulse 70, temperature 98 F (36.7 C), temperature source Oral, resp. rate 20, height 5' 11 (1.803 m), weight 126 kg, SpO2 91%. Temp:  [97.5 F (36.4 C)-99.4 F (37.4 C)] 98 F (36.7 C) (01/03 1130) Pulse Rate:  [46-91] 70 (01/03 1130) Resp:  [16-24] 20 (01/03 1130) BP: (100-147)/(42-96) 128/56 (01/03 1130) SpO2:  [91 %-99 %] 91 % (01/03 1130)  Physical Exam: Physical Exam Constitutional:      Appearance: He is well-developed.  HENT:     Head: Normocephalic and atraumatic.  Eyes:     Conjunctiva/sclera: Conjunctivae normal.  Cardiovascular:     Rate and Rhythm: Normal rate and regular rhythm.     Heart sounds: No murmur heard.    No friction rub. No gallop.  Pulmonary:     Effort: Pulmonary effort is normal. No respiratory distress.     Breath sounds: Normal breath sounds. No stridor. No wheezing.  Abdominal:     General: There is no distension.     Palpations: Abdomen is soft.  Musculoskeletal:  General: Normal range of motion.      Cervical back: Normal range of motion and neck supple.  Skin:    General: Skin is warm and dry.     Findings: No erythema or rash.  Neurological:     General: No focal deficit present.     Mental Status: He is alert and oriented to person, place, and time.  Psychiatric:        Mood and Affect: Mood normal.        Behavior: Behavior normal.        Thought Content: Thought content normal.        Judgment: Judgment normal.      CBC:    BMET Recent Labs    04/21/23 0940 04/22/23 0420  NA 131* 131*  K 4.9 4.1  CL 96* 97*  CO2 25 25  GLUCOSE 159* 108*  BUN 18 18  CREATININE 1.37* 1.39*  CALCIUM 8.2* 8.3*     Liver Panel  Recent Labs    04/21/23 0940 04/22/23 0420  PROT 6.2* 7.0  ALBUMIN 1.9* 1.9*  AST 41 34  ALT 20 16  ALKPHOS 78 70  BILITOT 0.9 0.5       Sedimentation Rate No results for input(s): ESRSEDRATE in the last 72 hours. C-Reactive Protein No results for input(s): CRP in the last 72 hours.  Micro Results: Recent Results (from the past 720 hours)  Blood Culture (routine x 2)     Status: None   Collection Time: 04/15/23  1:07 AM   Specimen: BLOOD RIGHT HAND  Result Value Ref Range Status   Specimen Description BLOOD RIGHT HAND  Final   Special Requests   Final    BOTTLES DRAWN AEROBIC AND ANAEROBIC Blood Culture adequate volume   Culture   Final    NO GROWTH 5 DAYS Performed at [facility], [address] [place], [place] [address]    Report Status 04/20/2023 FINAL  Final  Blood Culture (routine x 2)     Status: Abnormal   Collection Time: 04/15/23  1:07 AM   Specimen: BLOOD RIGHT HAND  Result Value Ref Range Status   Specimen Description BLOOD RIGHT HAND  Final   Special Requests   Final    BOTTLES DRAWN AEROBIC AND ANAEROBIC Blood Culture adequate volume   Culture  Setup Time   Final    GRAM POSITIVE COCCI ANAEROBIC BOTTLE ONLY CRITICAL RESULT CALLED TO, READ BACK BY AND VERIFIED WITH: PHARMD L. LAURENCE [id number] @ 2204  FH Performed at [facility], [address] [place], [place] [address]    Culture STAPHYLOCOCCUS AUREUS (A)  Final   Report Status 04/17/2023 FINAL  Final   Organism ID, Bacteria STAPHYLOCOCCUS AUREUS  Final      Susceptibility   Staphylococcus aureus - MIC*    CIPROFLOXACIN <=0.5 SENSITIVE Sensitive     ERYTHROMYCIN >=8 RESISTANT Resistant     GENTAMICIN  <=0.5 SENSITIVE Sensitive     OXACILLIN <=0.25 SENSITIVE Sensitive     TETRACYCLINE <=1 SENSITIVE Sensitive     VANCOMYCIN  <=0.5 SENSITIVE Sensitive     TRIMETH/SULFA 160 RESISTANT Resistant     CLINDAMYCIN <=0.25 SENSITIVE Sensitive     RIFAMPIN <=0.5 SENSITIVE Sensitive     Inducible Clindamycin NEGATIVE Sensitive     LINEZOLID 2 SENSITIVE Sensitive     * STAPHYLOCOCCUS AUREUS  Blood Culture ID Panel (Reflexed)     Status: Abnormal   Collection Time: 04/15/23  1:07 AM  Result Value Ref Range Status   Enterococcus faecalis NOT DETECTED NOT DETECTED Final   Enterococcus Faecium NOT DETECTED NOT DETECTED Final   Listeria monocytogenes NOT DETECTED NOT DETECTED Final   Staphylococcus species DETECTED (A) NOT DETECTED Final    Comment: CRITICAL RESULT CALLED TO, READ BACK BY AND VERIFIED WITH: PHARMD L. CHEN [id number] @ 2204 FH    Staphylococcus aureus (BCID) DETECTED (A) NOT DETECTED Final    Comment: CRITICAL RESULT CALLED TO, READ BACK BY AND VERIFIED WITH: PHARMD L. LAURENCE [id number] @ 2204 FH    Staphylococcus epidermidis NOT DETECTED NOT DETECTED Final   Staphylococcus lugdunensis NOT DETECTED NOT DETECTED Final   Streptococcus species NOT DETECTED NOT DETECTED Final   Streptococcus agalactiae NOT DETECTED NOT DETECTED Final   Streptococcus pneumoniae NOT DETECTED NOT DETECTED Final   Streptococcus pyogenes NOT DETECTED NOT DETECTED Final   A.calcoaceticus-baumannii NOT DETECTED NOT DETECTED Final   Bacteroides fragilis NOT DETECTED NOT DETECTED Final   Enterobacterales NOT DETECTED NOT DETECTED Final   Enterobacter  cloacae complex NOT DETECTED NOT DETECTED Final   Escherichia coli NOT DETECTED NOT DETECTED Final   Klebsiella aerogenes NOT DETECTED NOT DETECTED Final   Klebsiella oxytoca NOT DETECTED NOT DETECTED Final   Klebsiella pneumoniae NOT DETECTED NOT DETECTED Final   Proteus species NOT DETECTED NOT DETECTED Final   Salmonella species NOT DETECTED NOT DETECTED Final   Serratia marcescens NOT DETECTED NOT DETECTED Final   Haemophilus influenzae NOT DETECTED NOT DETECTED Final   Neisseria meningitidis NOT DETECTED NOT DETECTED Final   Pseudomonas aeruginosa NOT DETECTED NOT DETECTED Final   Stenotrophomonas maltophilia NOT DETECTED NOT DETECTED Final   Candida albicans NOT DETECTED NOT DETECTED Final   Candida auris NOT DETECTED NOT DETECTED Final   Candida glabrata NOT DETECTED NOT DETECTED Final   Candida krusei NOT DETECTED NOT DETECTED Final   Candida parapsilosis NOT DETECTED NOT DETECTED Final   Candida tropicalis NOT DETECTED NOT DETECTED Final   Cryptococcus neoformans/gattii NOT DETECTED NOT DETECTED Final   Meth resistant mecA/C and MREJ NOT DETECTED NOT DETECTED Final    Comment: Performed at [facility], [address], [place], [place] [address]  Urine Culture     Status: Abnormal   Collection Time: 04/15/23  3:49 AM   Specimen: Urine, Clean Catch  Result Value Ref Range Status   Specimen Description URINE, CLEAN CATCH  Final   Special Requests   Final    NONE Performed at [facility], [address], [place], [place] [address]    Culture (A)  Final    70,000 COLONIES/mL KLEBSIELLA PNEUMONIAE 50,000 COLONIES/mL KLEBSIELLA PNEUMONIAE    Report Status 04/17/2023 FINAL  Final   Organism ID, Bacteria KLEBSIELLA PNEUMONIAE (A)  Final   Organism ID, Bacteria KLEBSIELLA PNEUMONIAE (A)  Final      Susceptibility   Klebsiella pneumoniae - MIC*    AMPICILLIN  >=32 RESISTANT Resistant     CEFAZOLIN  <=4 SENSITIVE Sensitive     CEFEPIME  <=0.12 SENSITIVE Sensitive      CEFTRIAXONE <=0.25 SENSITIVE Sensitive     CIPROFLOXACIN <=0.25 SENSITIVE Sensitive     GENTAMICIN  <=1 SENSITIVE Sensitive     IMIPENEM <=0.25 SENSITIVE Sensitive     NITROFURANTOIN 64 INTERMEDIATE Intermediate     TRIMETH/SULFA <=20 SENSITIVE Sensitive     AMPICILLIN /SULBACTAM >=32 RESISTANT Resistant     PIP/TAZO 8 SENSITIVE Sensitive ug/mL   Klebsiella pneumoniae - MIC*    AMPICILLIN  >=32 RESISTANT Resistant  CEFAZOLIN  <=4 SENSITIVE Sensitive     CEFEPIME  <=0.12 SENSITIVE Sensitive     CEFTRIAXONE <=0.25 SENSITIVE Sensitive     CIPROFLOXACIN <=0.25 SENSITIVE Sensitive     GENTAMICIN  <=1 SENSITIVE Sensitive     IMIPENEM <=0.25 SENSITIVE Sensitive     NITROFURANTOIN <=16 SENSITIVE Sensitive     TRIMETH/SULFA <=20 SENSITIVE Sensitive     AMPICILLIN /SULBACTAM >=32 RESISTANT Resistant     PIP/TAZO 16 SENSITIVE Sensitive ug/mL    * 70,000 COLONIES/mL KLEBSIELLA PNEUMONIAE    50,000 COLONIES/mL KLEBSIELLA PNEUMONIAE  Culture, blood (Routine X 2) w Reflex to ID Panel     Status: None   Collection Time: 04/17/23  6:55 AM   Specimen: BLOOD RIGHT HAND  Result Value Ref Range Status   Specimen Description BLOOD RIGHT HAND  Final   Special Requests   Final    BOTTLES DRAWN AEROBIC AND ANAEROBIC Blood Culture results may not be optimal due to an inadequate volume of blood received in culture bottles   Culture   Final    NO GROWTH 5 DAYS Performed at [facility], [address], [place], [place] [address]    Report Status 04/22/2023 FINAL  Final  Culture, blood (Routine X 2) w Reflex to ID Panel     Status: None   Collection Time: 04/17/23  6:59 AM   Specimen: BLOOD RIGHT ARM  Result Value Ref Range Status   Specimen Description BLOOD RIGHT ARM  Final   Special Requests   Final    BOTTLES DRAWN AEROBIC AND ANAEROBIC Blood Culture results may not be optimal due to an inadequate volume of blood received in culture bottles   Culture   Final    NO GROWTH 5 DAYS Performed at [facility], [address], [place], [place] [address]    Report Status 04/22/2023 FINAL  Final  Surgical pcr screen     Status: Abnormal   Collection Time: 04/18/23  4:53 AM   Specimen: Nasal Mucosa; Nasal Swab  Result Value Ref Range Status   MRSA, PCR NEGATIVE NEGATIVE Final   Staphylococcus aureus POSITIVE (A) NEGATIVE Final    Comment: (NOTE) The Xpert SA Assay (FDA approved for NASAL specimens in patients 44 years of age and older), is one component of a comprehensive surveillance program. It is not intended to diagnose infection nor to guide or monitor treatment. Performed at [facility], [address] [place], [place] [address]   Aerobic/Anaerobic Culture w Gram Stain (surgical/deep wound)     Status: None (Preliminary result)   Collection Time: 04/18/23  8:07 AM   Specimen: PATH Digit amputation; Tissue  Result Value Ref Range Status   Specimen Description TISSUE  Final   Special Requests left 2nd toe  Final   Gram Stain   Final    RARE WBC PRESENT, PREDOMINANTLY PMN RARE GRAM POSITIVE COCCI IN PAIRS Performed at [facility], [address], [place], [place] [address]    Culture   Final    FEW STAPHYLOCOCCUS AUREUS NO ANAEROBES ISOLATED; CULTURE IN PROGRESS FOR 5 DAYS    Report Status PENDING  Incomplete   Organism ID, Bacteria STAPHYLOCOCCUS AUREUS  Final      Susceptibility   Staphylococcus aureus - MIC*    CIPROFLOXACIN <=0.5 SENSITIVE Sensitive     ERYTHROMYCIN >=8 RESISTANT Resistant     GENTAMICIN  <=0.5 SENSITIVE Sensitive     OXACILLIN <=0.25 SENSITIVE Sensitive     TETRACYCLINE <=1 SENSITIVE  Sensitive     VANCOMYCIN  <=0.5 SENSITIVE Sensitive     TRIMETH/SULFA 160 RESISTANT Resistant     CLINDAMYCIN <=0.25 SENSITIVE Sensitive     RIFAMPIN <=0.5 SENSITIVE Sensitive     Inducible Clindamycin NEGATIVE Sensitive     LINEZOLID 2 SENSITIVE Sensitive     * FEW STAPHYLOCOCCUS AUREUS  Surgical PCR screen     Status: Abnormal   Collection Time:  04/21/23  7:52 AM   Specimen: Nasal Mucosa; Nasal Swab  Result Value Ref Range Status   MRSA, PCR NEGATIVE NEGATIVE Final   Staphylococcus aureus POSITIVE (A) NEGATIVE Final    Comment: (NOTE) The Xpert SA Assay (FDA approved for NASAL specimens in patients 55 years of age and older), is one component of a comprehensive surveillance program. It is not intended to diagnose infection nor to guide or monitor treatment. Performed at [facility], [address] [place], [place] [address]     Studies/Results: EP PPM/ICD IMPLANT Result Date: 04/22/2023 SURGEON:  Eulas Ponce [place], MD    PREPROCEDURE DIAGNOSES:  1. MSSA bacteremia with dual chamber pacemaker    POSTPROCEDURE DIAGNOSES:  Same as preprocedure.  PROCEDURES:   1. Removal of dual chamber pacemaker   INTRODUCTION:  Adam Ponce is a 73 y.o. male with a history of sinus bradycardia and prior [employer] dual chamber pacemaker implantation admitted with abscess of left second toe and MSSA bacteremia. The pacemaker was programmed to a LRL of 40 bpm over the prior 48 hours, and the patient has not required pacing.   DESCRIPTION OF PROCEDURE:  Informed written consent was obtained and the patient was brought to the electrophysiology lab in the fasting state. Moderate sedation was administered during the procedure.  The patient's left chest was prepped and draped in the usual sterile fashion by the EP lab staff.  The skin overlying the left deltopectoral region was infiltrated with lidocaine  for local analgesia.  An incision was made over the existing pocket.  Electrocautery was used to assure hemostasis.  The device was exposed and removed from the pocket.  The device was disconnected from the leads. The tie-down sleeves were exposed and the sutures ligated. Stylets were placed within the leads and the helix of each lead withdrawn and counterclockwise rotation of the lead performed to free each lead from the myocardium. Both the RV and  RA leads easily fell free of the myocardium and were easily withdrawn. Although there was minimal bleeding, a hemostatic suture was placed to secure the vascular access sites using 3-0 Vicryl. The pocket was  irrigated with antibiotic solution.  The pocket was then closed in layers of absorbable suture. EBL<66ml. Steri-Strips and a  sterile dressing were then applied.  There were no early apparent complications.     CONCLUSIONS:  1. Successful removal of a Forensic Psychologist.  2.  No early apparent complications. 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 male with with well controlled HIV on Prezcobix  and Descovy  via [place] in [place] who has comorbid left second toe osteomyelitis now status post amputation but unfortunate with MSSA bacteremia he has a pacemaker in place.  TEE was negative but of course the device will be infected with this organism being in the blood pacemaker extraction is planned for today.  Will plan on giving him 6 weeks of post pacemaker removal IV antibiotics  #1  MSSA bacteremia from osteomyelitis status post amputation and also with undoubtedly seeding of pacemaker now status post amputation of infected bone and removal of pacemaker   Diagnosis: MSSA bacteremia/osteo   Culture Result: MSSA  Allergies  Allergen Reactions   Ciprofloxacin Other (See Comments)    unknown   Ciprofloxacin-Dexamethasone  Other (See Comments)     unknown   Codeine     OPAT Orders Discharge antibiotics to be given via PICC line Discharge antibiotics: Cefazolin  2g IV every 8 hours indicated) Duration: 6 weeks End Date: 06/02/23   [facility] Care Per Protocol:  Home health RN for IV administration and teaching; PICC line care and labs.    Labs weekly while on IV antibiotics: _x_ CBC with differential _x_ BMP   _x_ Please pull PIC at completion of IV antibiotics __ Please leave PIC in place until doctor has seen patient or been notified  Fax weekly labs to [phone]  Clinic Follow Up Appt:   [facility] has an appointment on 05/24/2022 at 130PM with Dr. Efrain at  [facility] for Infectious Disease, which  is located in the [facility] at  [address] in [place].  [address], which is located to the left of the elevators.  Phone: [phone]  Fax: [phone]  [email or web address]  The patient should arrive 30 minutes prior to their appoitment.  #2 HIV disease  Well controlled and reviewed his viral load which is undetectable and CD4 count which is healthy   Continue Prezcobix  and Descovy    I have personally spent 50 minutes involved in face-to-face and non-face-to-face activities for this patient on the day of the visit. Professional time spent includes the following activities: Preparing to see the patient (review of tests), Obtaining and/or reviewing separately obtained history (admission/discharge record), Performing a medically appropriate examination and/or evaluation , Ordering medications/tests/procedures, referring and communicating with other health care professionals, Documenting clinical information in the EMR, Independently interpreting results (not separately reported), Communicating results to the patient/family/caregiver, Counseling and educating the patient/family/caregiver and Care coordination (not separately reported).   I will sign off for  now  Please call with further questions.   LOS: 7 days   Jomarie Fleeta Rothman 04/22/2023, 12:41 PM

## 2023-04-22 NOTE — Discharge Instructions (Addendum)
 Device removal site wound care  This sheet gives you information about how to care for yourself after your procedure. Your health care provider may also give you more specific instructions. If you have problems or questions, contact your health care provider. What can I expect after the procedure? After your procedure, it is common to have: Pain or soreness at the site where the cardiac device was removed Some swelling at the site Follow these instructions at home: Incision care  Keep the incision clean and dry. Do not take baths, swim, or use a hot tub until after your wound check.  Do not shower for at least 7 days, or as directed by your health care provider. Pat the area dry with a clean towel. Do not rub the area. This may cause bleeding. Follow instructions from your health care provider about how to take care of your incision. Make sure you: If your incision is sealed with Steri-strips or staples, you may shower 7 days after your procedure or when told by your provider. Do not remove the steri-strips or let the shower hit directly on your site. You may wash around your site with soap and water.  Check your incision area every day for signs of infection. Check for: More redness, swelling, or pain. More fluid or blood. Warmth. Pus or a bad smell. Activity Do not lift anything that is heavier than 10 lb (4.5 kg) until your health care provider says it is okay to do so. For the first week, or as long as told by your health care provider: Avoid lifting your affected arm higher than your shoulder. After 1 week, Be gentle when you move your arms over your head. It is okay to raise your arm to comb your hair. Avoid strenuous exercise. Ask your health care provider when it is okay to: Resume your normal activities. Return to work or school. Resume sexual activity. Eating and drinking Eat a heart-healthy diet. This should include plenty of fresh fruits and vegetables, whole grains,  low-fat dairy products, and lean protein like chicken and fish. Limit alcohol intake to no more than 1 drink a day for non-pregnant women and 2 drinks a day for men. One drink equals 12 oz of beer, 5 oz of wine, or 1 oz of hard liquor. Check ingredients and nutrition facts on packaged foods and beverages. Avoid the following types of food: Food that is high in salt (sodium). Food that is high in saturated fat, like full-fat dairy or red meat. Food that is high in trans fat, like fried food. Food and drinks that are high in sugar. Lifestyle Do not use any products that contain nicotine or tobacco, such as cigarettes and e-cigarettes. If you need help quitting, ask your health care provider. Take steps to manage and control your weight. Once cleared, get regular exercise. Aim for 150 minutes of moderate-intensity exercise (such as walking or yoga) or 75 minutes of vigorous exercise (such as running or swimming) each week. Manage other health problems, such as diabetes or high blood pressure. Ask your health care provider how you can manage these conditions. General instructions Do not drive until cleared to by your doctor. Take over-the-counter and prescription medicines only as told by your health care provider. Avoid putting pressure on the area where the cardiac device was placed. If you need an MRI after your cardiac device has been placed, be sure to tell the health care provider who orders the MRI that you have a cardiac device.  Avoid close and prolonged exposure to electrical devices that have strong magnetic fields. These include: Cell phones. Avoid keeping them in a pocket near the cardiac device, and try using the ear opposite the cardiac device. MP3 players. Household appliances, like microwaves. Metal detectors. Electric generators. High-tension wires. Keep all follow-up visits as directed by your health care provider. This is important. Contact a health care provider if: You  have pain at the incision site that is not relieved by over-the-counter or prescription medicines. You have any of these around your incision site or coming from it: More redness, swelling, or pain. Fluid or blood. Warmth to the touch. Pus or a bad smell. You have a fever. You feel brief, occasional palpitations, light-headedness, or any symptoms that you think might be related to your heart. Get help right away if: You experience chest pain that is different from the pain at the cardiac device site. You develop a red streak that extends above or below the incision site. You experience shortness of breath. You have palpitations or an irregular heartbeat. You have light-headedness that does not go away quickly. You faint or have dizzy spells. Your pulse suddenly drops or increases rapidly and does not return to normal. You begin to gain weight and your legs and ankles swell. Summary After your procedure, it is common to have pain, soreness, and some swelling where pacemaker was removed. Make sure to keep your incision clean and dry. Follow instructions from your health care provider about how to take care of your incision. Check your incision every day for signs of infection, such as more pain or swelling, pus or a bad smell, warmth, or leaking fluid and blood. Avoid strenuous exercise and lifting your left arm higher than your shoulder for 2 weeks, or as long as told by your health care provider. This information is not intended to replace advice given to you by your health care provider. Make sure you discuss any questions you have with your health care provider.

## 2023-04-22 NOTE — Progress Notes (Signed)
 OT Cancellation Note  Patient Details Name: Adam Ponce MRN: [id number] DOB: 1950/12/17   Cancelled Treatment:    Reason Eval/Treat Not Completed: Patient at procedure or test/ unavailable (PPM / off unit to cath lab)  Ely Molt 04/22/2023, 8:40 AM

## 2023-04-22 NOTE — Progress Notes (Signed)
 [facility] PROGRESS NOTE  Adam Ponce (DOB: 05-26-1950) FMW:[id number] PCP: System, Provider Not In  Brief Narrative: Adam Ponce is a 73 y.o. male with a history of atrial flutter s/p PPM, HIV, IDT2DM, HTN, HLD, ILD, COPD, OSA, neuropathy, PAD s/p toe amputations, and BPH who presented to the ED after being found down at his facility on 04/14/2023. He was found to have osteomyelitis and abscess involving the left 2nd toe with MSSA bacteremia (concordant with operative cultures). Underwent amputation of remaining 2nd toe at MPJ level 12/30 by podiatry, Dr. Malvin. TEE showed no endocarditis, pacemaker wires without vegetations, LVEF 45-50%. EP was consulted for pacemaker extraction, turned rate down to 40's which showed non-dependence on pacemaker and explant is planned 1/3. Continues on ancef  with planned 4 weeks Tx thru PICC (placed RUA on 12/31), and will return to SNF at [facility].  Subjective: Hiccups again this morning, has been improved by medications. No chest pain or shortness of breath or fevers. Pain is controlled.   Objective: BP 132/86 (BP Location: Left Arm)   Pulse 61   Temp 99.4 F (37.4 C) (Oral)   Resp 20   Ht 5' 11 (1.803 m)   Wt 126 kg   SpO2 95%   BMI 38.74 kg/m   Gen: No distress Pulm: Clear, nonlabored  CV: Occasional premature beats with evidence of occasional pacing, no MRG or pitting edema GI: Soft, NT, ND, +BS Neuro: Alert and oriented. No new focal deficits. Ext: Warm, dry, postop shoe on left foot with great toe exposed, intact cap refill, warm.    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 Repeat blood cultures from 12/29 negative.  - Per ID, continue ancef  2g IV q8h x6 weeks (end date 06/02/2023) thru PICC (placed 12/31, can be removed after completion of abx)  - Follow up with Dr. Efrain, [facility] 05/25/2023 at 1:30pm. Send weekly CBC, BMP to them (fax  [phone]) - Follow up with podiatry, Dr. Malvin in 1-2 weeks post discharge. Leave dressing intact until outpatient follow-up. WBAT to left heel only in postop shoe.   HIV disease: VL undetectable. Continue ART.   Rhabdomyolysis -due to being down, CK improving   AKI on CKD stage IIIa -Creatinine downtrending appropriately with supportive care, most recent creatinine ratio was 1.3 in 2020.  Creatinine was as high as 2.34 on admission, and currently at baseline.   Heart failure, ruled out -Echo EF 50 to 55% low normal left ventricular function without wall motion abnormalities, without diastolic dysfunction    Mild hyponatremia -possibly dehydrated with hypochloremia, patient tells me he has not been able to eat or drink due to several times being n.p.o. in the past several days, including this morning.  - Follow in AM after restarting diet postprocedure.   Syncope - Likely vasovagal event. CT head negative for acute intracranial abnormality and no focal neurodeficit on exam. PE less likely given no tachycardia, chest pain, or hypoxia.    Generalized weakness - PT/OT eval, fall precautions.   Atrial flutter - Has a pacemaker. - Per patient he was taken off coumadin by his PCP at the [facility].    IDT2DM: Uncontrolled with hyperglycemia. HbA1c 8.6%.  - Continue basal-bolus insulin  here.    Hypertension -resume meds as necessary, remains normotensive   Hyperlipidemia - Hold statin at this time in the setting of rhabdomyolysis.   Asthma/COPD - Stable, no signs of acute exacerbation.  ILD - stable/asymptomatic   BPH - stable asymptomatic    Adam KATHEE Come, MD [facility] [email or web address] 04/22/2023, 8:04 AM

## 2023-04-23 ENCOUNTER — Inpatient Hospital Stay (HOSPITAL_COMMUNITY): Payer: No Typology Code available for payment source

## 2023-04-23 DIAGNOSIS — T827XXA Infection and inflammatory reaction due to other cardiac and vascular devices, implants and grafts, initial encounter: Secondary | ICD-10-CM | POA: Diagnosis not present

## 2023-04-23 LAB — AEROBIC/ANAEROBIC CULTURE W GRAM STAIN (SURGICAL/DEEP WOUND)

## 2023-04-23 LAB — GLUCOSE, CAPILLARY
Glucose-Capillary: 158 mg/dL — ABNORMAL HIGH (ref 70–99)
Glucose-Capillary: 141 mg/dL — ABNORMAL HIGH (ref 70–99)
Glucose-Capillary: 125 mg/dL — ABNORMAL HIGH (ref 70–99)
Glucose-Capillary: 178 mg/dL — ABNORMAL HIGH (ref 70–99)

## 2023-04-23 MED ORDER — ACETAMINOPHEN 500 MG PO TABS: 500.0000 mg | ORAL_TABLET | Freq: Four times a day (QID) | ORAL | Status: AC

## 2023-04-23 MED ORDER — OXYCODONE HCL 10 MG PO TABS: 5.0000 mg | ORAL_TABLET | Freq: Four times a day (QID) | ORAL | 0 refills | Status: DC | PRN

## 2023-04-23 MED ORDER — CEFAZOLIN IV (FOR PTA / DISCHARGE USE ONLY): 2.0000 g | Freq: Three times a day (TID) | INTRAVENOUS | Status: AC

## 2023-04-23 MED ORDER — PANTOPRAZOLE SODIUM 40 MG PO TBEC
40.0000 mg | DELAYED_RELEASE_TABLET | Freq: Two times a day (BID) | ORAL | Status: DC
  Administered 2023-04-23: 40 mg via ORAL
  Filled 2023-04-23: qty 1

## 2023-04-23 MED ORDER — SODIUM CHLORIDE 0.9 % IV SOLN
12.5000 mg | Freq: Once | INTRAVENOUS | Status: AC
  Administered 2023-04-23: 12.5 mg via INTRAVENOUS
  Filled 2023-04-23: qty 0.5

## 2023-04-23 MED ORDER — SENNOSIDES-DOCUSATE SODIUM 8.6-50 MG PO TABS
2.0000 | ORAL_TABLET | Freq: Two times a day (BID) | ORAL | Status: DC
  Administered 2023-04-23: 2 via ORAL
  Filled 2023-04-23: qty 2

## 2023-04-23 MED ORDER — PREGABALIN 100 MG PO CAPS: 100.0000 mg | ORAL_CAPSULE | Freq: Two times a day (BID) | ORAL | 0 refills | Status: AC

## 2023-04-23 MED ORDER — PANTOPRAZOLE SODIUM 40 MG PO TBEC: 40.0000 mg | DELAYED_RELEASE_TABLET | Freq: Two times a day (BID) | ORAL | Status: AC

## 2023-04-23 MED ORDER — HEPARIN SOD (PORK) LOCK FLUSH 100 UNIT/ML IV SOLN
250.0000 [IU] | INTRAVENOUS | Status: AC | PRN
  Administered 2023-04-23: 250 [IU]

## 2023-04-23 NOTE — Progress Notes (Signed)
 Physical Therapy Treatment Patient Details Name: Adam Ponce MRN: [id number] DOB: 12/07/1950 Today's Date: 04/23/2023   History of Present Illness 73 y.o. male presents to [facility] hospital on 04/14/2023 after a fall. Pt admitted with concern for L foot infection and rhabdomyolysis. He underwent L 2nd toe amp 04/18/23. PMH includes a flutter s/p PPM, DMII, toe amputation, HIV, HTN, HLD, GERD, COPD, CKD III, OSA, BPH.    PT Comments  Pt asleep on arrival, engaged pt while he was arousing.  Pt slow to progress toward goals.  Emphasis on transition to EOB, scooting to edge, sit to stand x3 into the RW.  Working on safe transfer technique and pivoting in the RW to the chair.     If plan is discharge home, recommend the following: Two people to help with walking and/or transfers;Two people to help with bathing/dressing/bathroom;Assistance with cooking/housework;Assist for transportation;Help with stairs or ramp for entrance   Can travel by private vehicle     No  Equipment Recommendations  Other (comment) (TBD)    Recommendations for Other Services       Precautions / Restrictions Precautions Precautions: Fall Other Brace: post op shoe Restrictions Weight Bearing Restrictions Per Provider Order: Yes LLE Weight Bearing Per Provider Order: Weight bearing as tolerated LLE Partial Weight Bearing Percentage or Pounds: heel WB in post op shoe for transfers only     Mobility  Bed Mobility Overal bed mobility: Needs Assistance Bed Mobility: Supine to Sit     Supine to sit: Max assist, HOB elevated, Used rails     General bed mobility comments: cues for direction.  Max assist for bridging to EOB, max assist up from supine.    Transfers Overall transfer level: Needs assistance Equipment used: Rolling walker (2 wheels) Transfers: Sit to/from Stand Sit to Stand: Mod assist Stand pivot transfers: Mod assist         General transfer comment: pt able to stand with use of the RW and CG/min  assist, but with movment needs mod assist for stability and assist moving the RW.  Pt unable to pivot across and distance and the chair was brought over to the patient.    Ambulation/Gait               General Gait Details: unable at this time.   Stairs             Wheelchair Mobility     Tilt Bed    Modified Rankin (Stroke Patients Only)       Balance Overall balance assessment: Needs assistance Sitting-balance support: No upper extremity supported, Feet supported Sitting balance-Leahy Scale: Fair     Standing balance support: Bilateral upper extremity supported Standing balance-Leahy Scale: Poor Standing balance comment: pt reliant on AD and external support.  CGA for static standing, but dynamically needing light mod assist. 2 trials of standing.  pt urinated in standing, before continueing the pivot for a total of 90 deg pivot.                            Cognition Arousal: Alert Behavior During Therapy: WFL for tasks assessed/performed Overall Cognitive Status: Within Functional Limits for tasks assessed                                          Exercises  General Comments General comments (skin integrity, edema, etc.): vss on RA.  pt into a long episode of hiccups.      Pertinent Vitals/Pain Pain Assessment Pain Assessment: Faces Faces Pain Scale: Hurts little more Pain Location: R LE with any movement Pain Descriptors / Indicators: Discomfort Pain Intervention(s): Limited activity within patient's tolerance    Home Living Family/patient expects to be discharged to:: Skilled nursing facility Living Arrangements: Alone                      Prior Function            PT Goals (current goals can now be found in the care plan section) Acute Rehab PT Goals PT Goal Formulation: With patient Time For Goal Achievement: 04/30/23 Potential to Achieve Goals: Fair Progress towards PT goals: Progressing  toward goals    Frequency    Min 1X/week      PT Plan      Co-evaluation              AM-PAC PT 6 Clicks Mobility   Outcome Measure  Help needed turning from your back to your side while in a flat bed without using bedrails?: A Lot Help needed moving from lying on your back to sitting on the side of a flat bed without using bedrails?: A Lot Help needed moving to and from a bed to a chair (including a wheelchair)?: A Lot Help needed standing up from a chair using your arms (e.g., wheelchair or bedside chair)?: A Lot Help needed to walk in hospital room?: Total Help needed climbing 3-5 steps with a railing? : Total 6 Click Score: 10    End of Session   Activity Tolerance: Patient limited by fatigue Patient left: in chair;with call bell/phone within reach Nurse Communication: Mobility status PT Visit Diagnosis: Other abnormalities of gait and mobility (R26.89);Pain Pain - Right/Left: Left Pain - part of body: Ankle and joints of foot     Time: 8496-8473 PT Time Calculation (min) (ACUTE ONLY): 23 min  Charges:    $Therapeutic Activity: 23-37 mins PT General Charges $$ ACUTE PT VISIT: 1 Visit                     04/23/2023  India HERO., PT Acute Rehabilitation Services [phone]  (office)   Vinie GAILS Takeyla Million 04/23/2023, 3:41 PM

## 2023-04-23 NOTE — Progress Notes (Signed)
 Pt discharge to [facility] and report called off to nurse Joe at the facility. Pt discharge education and care plan completed. LLE dsg unremarkable and intact with gauze dsg. Left upper chest pacemaker removal site dsg intact with small blood stained dsg. IV team notified per protocol for SL PICC to RUE. Pt waiting on [facility] to be picked up to transport to disposition. Pt's DNR and prescriptions including oxycodone  placed in DC packet . P. Amo Willmar Stockinger RN

## 2023-04-23 NOTE — Progress Notes (Signed)
 Pt picked up by [facility] to be transported to disposition, [facility]. Pt's PICC capped by IV team; pt transferred from chair to bed with two people assistance prior to transferring to stretcher via bed. Dionne Bucy RN

## 2023-04-23 NOTE — Discharge Summary (Signed)
 Physician Discharge Summary   Patient: Adam Ponce MRN: [id number] DOB: 1951-01-03  Admit date:     04/14/2023  Discharge date: 04/23/23  Discharge Physician: [name]   PCP: System, Provider Not In   Recommendations at discharge:  Continue ancef  for MSSA bacteremia, follow up with ID and podiatry as outlined below.  Continue management of hiccups with protonix  and bowel regimen, consider additional Tx if persistent.  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 male with a history of atrial flutter s/p PPM, HIV, IDT2DM, HTN, HLD, ILD, COPD, OSA, neuropathy, PAD s/p toe amputations, and BPH who presented to the ED after being found down at his facility on 04/14/2023. He was found to have osteomyelitis and abscess involving the left 2nd toe with MSSA bacteremia (concordant with operative cultures). Underwent amputation of remaining 2nd toe at MPJ level 12/30 by podiatry, Dr. Malvin. TEE showed no endocarditis, pacemaker wires without vegetations, LVEF 45-50%. EP was consulted for pacemaker extraction, turned rate down to 40's which showed non-dependence on pacemaker and explant is planned 1/3. Continues on ancef  with planned 4 weeks Tx thru PICC (placed RUA on 12/31), and will return to SNF at [facility].  Assessment and Plan: Sepsis due to MSSA bacteremia (+Cx 12/27), left 2nd toe osteomyelitis: s/p amputation (source control) 12/30. Repeat blood cultures from 12/29 negative.  - Per ID, continue ancef  2g IV q8h x6 weeks (end  date 06/02/2023) thru PICC (placed 12/31, can be removed after completion of abx)  - Follow up with Dr. Efrain, [facility] 05/25/2023 at 1:30pm. Send weekly CBC, BMP to them (fax [phone]) - Follow up with podiatry, Dr. Malvin in 1-2 weeks post discharge. Leave dressing intact until outpatient follow-up. WBAT to left heel only in postop shoe.    HIV disease: VL undetectable. Continue ART.    Rhabdomyolysis -due to being down, CK improving   AKI on CKD stage IIIa -Creatinine downtrending appropriately with supportive care, most recent creatinine ratio was 1.3 in 2020.  Creatinine was as high as 2.34 on admission, and currently at baseline.   Heart failure, ruled out -Echo EF 50 to 55% low normal left ventricular function without wall motion abnormalities, without diastolic dysfunction    Mild hyponatremia -possibly dehydrated with hypochloremia, patient tells me he has not been able to eat or drink due to several times being n.p.o. in the past several days.  - Repeat at follow up   Syncope - Likely vasovagal event. CT head negative for acute intracranial abnormality and no focal neurodeficit on exam. PE less likely given no tachycardia, chest pain, or hypoxia.    Generalized weakness - PT/OT eval, fall precautions.   Atrial flutter s/p pacemaker explant.  - Per patient he was taken off coumadin by his PCP at the [facility].     IDT2DM: Uncontrolled with hyperglycemia. HbA1c 8.6%.  - Continue basal-bolus insulin  here.    Hypertension -resume meds as necessary, remains normotensive   Hyperlipidemia - Hold statin at this time in  the setting of rhabdomyolysis.   Asthma/COPD - Stable, no signs of acute exacerbation.   ILD - stable/asymptomatic   BPH - stable asymptomatic  GERD with hiccups:  - Pt previously on pepcid, we've added protonix  for 2 week trial. Consider GI evaluation if persistent, continue bowel regimen as well.  Consultants: EP, ID Procedures performed: L 2nd toe ray amputation  12/30, TEE 12/31, PPM explant 04/22/23 Disposition: Skilled nursing facility Diet recommendation:  Cardiac and Carb modified diet DISCHARGE MEDICATION: Allergies as of 04/23/2023       Reactions   Ciprofloxacin Other (See Comments)   unknown   Ciprofloxacin-dexamethasone  Other (See Comments)   unknown   Codeine         Medication List     STOP taking these medications    benzonatate 100 MG capsule Commonly known as: TESSALON   cyanocobalamin 1000 MCG/ML injection Commonly known as: VITAMIN B12   darunavir  800 MG tablet Commonly known as: PREZISTA    guaiFENesin 600 MG 12 hr tablet Commonly known as: MUCINEX   ritonavir 100 MG Tabs tablet Commonly known as: NORVIR   traMADol  50 MG tablet Commonly known as: ULTRAM        TAKE these medications    acetaminophen  500 MG tablet Commonly known as: TYLENOL  Take 1 tablet (500 mg total) by mouth 4 (four) times daily.   albuterol  108 (90 Base) MCG/ACT inhaler Commonly known as: VENTOLIN  HFA Inhale into the lungs every 6 (six) hours as needed for wheezing or shortness of breath.   Azelastine  HCl 137 MCG/SPRAY Soln Place 2 sprays into the nose as needed.   bacitracin 500 UNIT/GM ointment Apply 1 Application topically 2 (two) times daily. Small amount   betamethasone valerate 0.1 % cream Commonly known as: VALISONE Apply topically 2 (two) times daily.   ceFAZolin  IVPB Commonly known as: ANCEF  Inject 2 g into the vein every 8 (eight) hours. Indication:  MSSA bacteremia/osteo First Dose: Yes Last Day of Therapy:  06/02/23 Labs - Once weekly:  CBC/D and BMP, Labs - Once weekly: ESR and CRP Method of administration: IV Push Pull PICC line at the completion of IV antibiotic therapy Method of administration may be changed at the discretion of home infusion pharmacist based upon assessment of the patient and/or caregiver's ability to self-administer the medication ordered.   darunavir -cobicistat  800-150 MG  tablet Commonly known as: PREZCOBIX  Take by mouth.   docusate sodium  100 MG capsule Commonly known as: COLACE Take 100 mg by mouth 2 (two) times daily.   emtricitabine -tenofovir  AF 200-25 MG tablet Commonly known as: DESCOVY  Take 1 tablet by mouth daily.   ezetimibe 10 MG tablet Commonly known as: ZETIA Take 10 mg by mouth daily.   famotidine 20 MG tablet Commonly known as: PEPCID Take 20 mg by mouth 2 (two) times daily.   fenofibrate 145 MG tablet Commonly known as: TRICOR Take 145 mg by mouth daily.   glipiZIDE 10 MG tablet Commonly known as: GLUCOTROL Take 10 mg by mouth daily before breakfast.   hydrocerin Crea Apply 1 Application topically as needed.   INSULIN  GLARGINE [place] Inject 16 Units into the skin at bedtime.   ipratropium 0.06 % nasal spray Commonly known as: ATROVENT Place 2 sprays into both nostrils 3 (three) times daily as needed for rhinitis.   metFORMIN 500 MG tablet Commonly known as: GLUCOPHAGE Take 500 mg by mouth 2 (two) times daily with a meal.   Oxycodone  HCl 10 MG Tabs Take 0.5-1 tablets (5-10 mg total) by  mouth every 6 (six) hours as needed for severe pain (pain score 7-10) or moderate pain (pain score 4-6).   pantoprazole  40 MG tablet Commonly known as: PROTONIX  Take 1 tablet (40 mg total) by mouth 2 (two) times daily for 14 days.   pravastatin 80 MG tablet Commonly known as: PRAVACHOL Take 80 mg by mouth daily.   pregabalin  100 MG capsule Commonly known as: LYRICA  Take 1 capsule (100 mg total) by mouth 2 (two) times daily.   Semaglutide (2 MG/DOSE) 8 MG/3ML Sopn Inject 2 mg into the skin once a week. Inject on Thursday   sennosides-docusate sodium  8.6-50 MG tablet Commonly known as: SENOKOT-S Take 1 tablet by mouth daily.   sodium chloride  0.65 % nasal spray Commonly known as: OCEAN Place 1 spray into the nose 4 (four) times daily as needed for congestion.   terazosin 2 MG capsule Commonly known as: HYTRIN Take 1 capsule  by mouth at bedtime.   triamcinolone cream 0.1 % Commonly known as: KENALOG Apply 1 Application topically daily as needed (itching on ears).   valACYclovir 1000 MG tablet Commonly known as: VALTREX Take 1,000 mg by mouth in the morning and at bedtime. As needed for 5 days (for outbreaks)   Vitamin D3 250 MCG (10000 UT) Tabs Take by mouth.               Discharge Care Instructions  (From admission, onward)           Start     Ordered   04/23/23 0000  Change dressing on IV access line weekly and PRN  (Home infusion instructions - [facility] )        04/23/23 1023            Follow-up Information     Comer, Lamar ORN, MD Follow up.   Specialty: Infectious Diseases Contact information: [address] [place] [address] [phone]         Malvin Marsa FALCON, DPM Follow up.   Specialty: Podiatry Contact information: [address] [place] [address] [phone]                Discharge Exam: Fredricka Weights   04/15/23 0354 04/18/23 0657  Weight: 126 kg 126 kg  BP (!) 130/50 (BP Location: Left Arm)   Pulse (!) 55   Temp 99.4 F (37.4 C) (Oral)   Resp 20   Ht 5' 11 (1.803 m)   Wt 126 kg   SpO2 97%   BMI 38.74 kg/m   Gen: No distress, frequent hiccups. Pulm: Clear, nonlabored  CV: RRR, no MRG GI: Soft, NT, ND, +BS Neuro: Alert and oriented. No new focal deficits. Ext: Warm, dry, postop shoe on left foot with great toe exposed, intact cap refill, warm.    Condition at discharge: Stable  The results of significant diagnostics from this hospitalization (including imaging, microbiology, ancillary and laboratory) are listed below for reference.   Imaging Studies: DG Chest 2 View Result Date: 04/23/2023 CLINICAL DATA:  Status post pacer removal. EXAM: CHEST - 2 VIEW COMPARISON:  04/15/2023 FINDINGS: There is a new right arm PICC line with tip in the SVC. Left chest wall pacer device has  been removed. No pneumothorax identified. Stable mild cardiac enlargement. Pulmonary vascular congestion without overt pulmonary edema. No airspace opacities or signs of pleural effusion. IMPRESSION: 1. No pneumothorax identified status post pacer device removal. 2. Pulmonary vascular congestion without overt pulmonary edema. Electronically Signed  By: Waddell Calk M.D.   On: 04/23/2023 09:39   EP PPM/ICD IMPLANT Result Date: 04/22/2023 SURGEON:  Eulas FORBES Furbish, MD    PREPROCEDURE DIAGNOSES:  1. MSSA bacteremia with dual chamber pacemaker    POSTPROCEDURE DIAGNOSES:  Same as preprocedure.  PROCEDURES:   1. Removal of dual chamber pacemaker   INTRODUCTION:  AMIL BOUWMAN is a 73 y.o. male with a history of sinus bradycardia and prior [employer] dual chamber pacemaker implantation admitted with abscess of left second toe and MSSA bacteremia. The pacemaker was programmed to a LRL of 40 bpm over the prior 48 hours, and the patient has not required pacing.   DESCRIPTION OF PROCEDURE:  Informed written consent was obtained and the patient was brought to the electrophysiology lab in the fasting state. Moderate sedation was administered during the procedure.  The patient's left chest was prepped and draped in the usual sterile fashion by the EP lab staff.  The skin overlying the left deltopectoral region was infiltrated with lidocaine  for local analgesia.  An incision was made over the existing pocket.  Electrocautery was used to assure hemostasis.  The device was exposed and removed from the pocket.  The device was disconnected from the leads. The tie-down sleeves were exposed and the sutures ligated. Stylets were placed within the leads and the helix of each lead withdrawn and counterclockwise rotation of the lead performed to free each lead from the myocardium. Both the RV and RA leads easily fell free of the myocardium and were easily withdrawn. Although there was minimal bleeding, a hemostatic suture was  placed to secure the vascular access sites using 3-0 Vicryl. The pocket was  irrigated with antibiotic solution.  The pocket was then closed in layers of absorbable suture. EBL<29ml. Steri-Strips and a  sterile dressing were then applied.  There were no early apparent complications.     CONCLUSIONS:  1. Successful removal of a Forensic Psychologist.  2.  No early apparent complications. Eulas FORBES Furbish, MD Cardiac Electrophysiology   US  EKG SITE RITE Result Date: 04/19/2023 If Site Rite image not attached, placement could not be confirmed due to current cardiac rhythm.  ECHO TEE Result Date: 04/19/2023    TRANSESOPHOGEAL ECHO REPORT   Patient Name:   TUVIA WOODRICK Date of Exam: 04/19/2023 Medical Rec #:  [id number]     Height:       71.0 in Accession #:    [id number]    Weight:       277.8 lb Date of Birth:  1951/03/26      BSA:          2.424 m Patient Age:    72 years      BP:           130/57 mmHg Patient Gender: M             HR:           83 bpm. Exam Location:  Inpatient Procedure: Transesophageal Echo, Cardiac Doppler and Color Doppler Indications:     Bacteremia  History:         Patient has prior history of Echocardiogram examinations, most                  recent 04/15/2023. CHF, Pacemaker, Signs/Symptoms:Syncope; Risk                  Factors:Hypertension and Diabetes.  Sonographer:     Thea Norlander RCS Referring Phys:  [id number] HAO MENG Diagnosing Phys: Vinie Maxcy MD PROCEDURE: After discussion of the risks and benefits of a TEE, an informed consent was obtained from the patient. The transesophogeal probe was passed without difficulty through the esophogus of the patient. Imaged were obtained with the patient in a supine position. Sedation performed by different physician. The patient was monitored while under deep sedation. Anesthestetic sedation was provided intravenously by Anesthesiology: 220.8mg  of Propofol , 100mg  of Lidocaine . The patient developed no complications  during the procedure.  IMPRESSIONS  1. Left ventricular ejection fraction, by estimation, is 45 to 50%. The left ventricle has mildly decreased function. The left ventricle demonstrates global hypokinesis. There is mild left ventricular hypertrophy.  2. Right ventricular systolic function is normal. The right ventricular size is normal.  3. No left atrial/left atrial appendage thrombus was detected.  4. Right atrial size was mildly dilated.  5. The mitral valve is grossly normal. Trivial mitral valve regurgitation.  6. The aortic valve is tricuspid. Aortic valve regurgitation is not visualized.  7. Agitated saline contrast bubble study was negative, with no evidence of any interatrial shunt. Conclusion(s)/Recommendation(s): No evidence of vegetation/infective endocarditis on this transesophageael echocardiogram. FINDINGS  Left Ventricle: Left ventricular ejection fraction, by estimation, is 45 to 50%. The left ventricle has mildly decreased function. The left ventricle demonstrates global hypokinesis. The left ventricular internal cavity size was normal in size. There is  mild left ventricular hypertrophy. Right Ventricle: The right ventricular size is normal. No increase in right ventricular wall thickness. Right ventricular systolic function is normal. Left Atrium: Left atrial size was normal in size. No left atrial/left atrial appendage thrombus was detected. Right Atrium: Right atrial size was mildly dilated. Pericardium: Trivial pericardial effusion is present. The pericardial effusion is posterior to the left ventricle. Mitral Valve: The mitral valve is grossly normal. Trivial mitral valve regurgitation. Tricuspid Valve: The tricuspid valve is grossly normal. Tricuspid valve regurgitation is mild. Aortic Valve: The aortic valve is tricuspid. Aortic valve regurgitation is not visualized. Pulmonic Valve: The pulmonic valve was grossly normal. Pulmonic valve regurgitation is trivial. Aorta: The aortic root and  ascending aorta are structurally normal, with no evidence of dilitation. IAS/Shunts: No atrial level shunt detected by color flow Doppler. Agitated saline contrast bubble study was negative, with no evidence of any interatrial shunt. Additional Comments: A device lead is visualized. Spectral Doppler performed.  AORTA Ao Asc diam: 3.40 cm TRICUSPID VALVE TR Peak grad:   30.2 mmHg TR Vmax:        275.00 cm/s Vinie Maxcy MD Electronically signed by Vinie Maxcy MD Signature Date/Time: 04/19/2023/10:02:01 AM    Final    EP STUDY Result Date: 04/19/2023 See surgical note for result.  DG Foot 2 Views Left Result Date: 04/18/2023 CLINICAL DATA:  252351 Post-operative state 252351, left second toe amputation EXAM: LEFT FOOT - 2 VIEW COMPARISON:  04/15/2023 left second toe radiographs FINDINGS: Interval amputation of the remaining left second toe with mild soft tissue swelling at the amputation site. No acute osseous fractures. No dislocation. Lisfranc joint appears intact. No osseous erosions or periosteal reaction. No appreciable soft tissue gas. No radiopaque foreign bodies. No focal osseous lesions. Prominent dorsal marginal osteophytes throughout the tarsal joints. Mild first MTP joint osteoarthritis. Vascular calcifications in the soft tissues. IMPRESSION: 1. Interval amputation of the remaining left second toe with mild soft tissue swelling at the amputation site. No acute osseous abnormality. 2. Prominent dorsal marginal osteophytes throughout the tarsal joints, suggesting hallux rigidus. 3. Mild  first MTP joint osteoarthritis. Electronically Signed   By: Selinda DELENA Blue M.D.   On: 04/18/2023 12:29   VAS US  ABI WITH/WO TBI Result Date: 04/17/2023  LOWER EXTREMITY DOPPLER STUDY Patient Name:  YASIN DUCAT  Date of Exam:   04/16/2023 Medical Rec #: [id number]      Accession #:    [id number] Date of Birth: Dec 15, 1950       Patient Gender: M Patient Age:   18 years Exam [facility]  [facility] Procedure:       VAS US  ABI WITH/WO TBI Referring Phys: JESSICA VANN --------------------------------------------------------------------------------  Indications: Ulceration. Pain. High Risk Factors: Hypertension, hyperlipidemia, Diabetes.  Comparison Study: No prior exam. Performing Technologist: Edilia Elden Appl  Examination Guidelines: A complete evaluation includes at minimum, Doppler waveform signals and systolic blood pressure reading at the level of bilateral brachial, anterior tibial, and posterior tibial arteries, when vessel segments are accessible. Bilateral testing is considered an integral part of a complete examination. Photoelectric Plethysmograph (PPG) waveforms and toe systolic pressure readings are included as required and additional duplex testing as needed. Limited examinations for reoccurring indications may be performed as noted.  ABI Findings: +---------+------------------+-----+---------+--------+ Right    Rt Pressure (mmHg)IndexWaveform Comment  +---------+------------------+-----+---------+--------+ Brachial 144                    triphasic         +---------+------------------+-----+---------+--------+ PTA      173               1.20 triphasic         +---------+------------------+-----+---------+--------+ DP       178               1.24 triphasic         +---------+------------------+-----+---------+--------+ [facility]               1.62 Abnormal          +---------+------------------+-----+---------+--------+ +---------+------------------+-----+---------+-------+ Left     Lt Pressure (mmHg)IndexWaveform Comment +---------+------------------+-----+---------+-------+ Brachial 143                    triphasic        +---------+------------------+-----+---------+-------+ PTA      166               1.15 triphasic        +---------+------------------+-----+---------+-------+ DP       178               1.24 triphasic         +---------+------------------+-----+---------+-------+ Great Toe128               0.89 Normal           +---------+------------------+-----+---------+-------+  Summary: Right: Resting right ankle-brachial index is within normal range. The right toe-brachial index is abnormal. Left: Resting left ankle-brachial index is within normal range. The left toe-brachial index is normal. *See table(s) above for measurements and observations.  Electronically signed by Debby Robertson on 04/17/2023 at 12:32:02 PM.    Final    MR FOOT LEFT WO CONTRAST Result Date: 04/15/2023 CLINICAL DATA:  Foot swelling, diabetes. Left second toe amputation 2 years ago. EXAM: MRI OF THE LEFT FOOT WITHOUT CONTRAST TECHNIQUE: Multiplanar, multisequence MR imaging of the left foot was performed. No intravenous contrast was administered. COMPARISON:  Radiographs 04/15/2023 FINDINGS: Bones/Joint/Cartilage Prior amputation of the second toe at the mid proximal phalangeal level. There is abnormal edema in the remaining proximal phalanx of  the second toe compatible with osteomyelitis. Substantial degenerative arthropathy in the midfoot and along the Lisfranc joint. Ligaments Lisfranc ligament intact. Muscles and Tendons Muscular atrophy in the forefoot. Soft tissues Along the dorsal lateral margin of the remaining proximal phalanx of the second toe, a 9 by 9 by 8 mm (volume = 300 mm^3) fluid signal intensity collection is present, probably a small abscess. Surrounding inflammatory findings compatible with cellulitis. Mild intermetatarsal bursitis between the heads of the first and second, and second and third metatarsals. Dorsal subcutaneous edema in the forefoot, cellulitis not excluded. IMPRESSION: 1. 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 2. 9 mm in diameter abscess along the dorsal-lateral margin of the remaining proximal phalanx second toe with surrounding cellulitis. 3. Dorsal subcutaneous edema in the forefoot,  cellulitis not excluded. 4. Mild intermetatarsal bursitis between the heads of the first and second, and second and third metatarsals. 5. Substantial degenerative arthropathy in the midfoot and along the Lisfranc joint. 6. Muscular atrophy in the forefoot. Electronically Signed   By: Yoniel Arkwright Salvage M.D.   On: 04/15/2023 16:11   ECHOCARDIOGRAM COMPLETE Result Date: 04/15/2023    ECHOCARDIOGRAM REPORT   Patient Name:   EULALIO REAMY Date of Exam: 04/15/2023 Medical Rec #:  [id number]     Height:       71.0 in Accession #:    [id number]    Weight:       277.8 lb Date of Birth:  August 05, 1950      BSA:          2.424 m Patient Age:    72 years      BP:           126/81 mmHg Patient Gender: M             HR:           64 bpm. Exam Location:  Inpatient Procedure: 2D Echo, Cardiac Doppler, Color Doppler and Intracardiac            Opacification Agent Indications:    Syncope  History:        Patient has no prior history of Echocardiogram examinations.                 CHF; Risk Factors:Diabetes and Hypertension.  Sonographer:    Juanita Shaw Referring Phys: [id number] RATHORE  Sonographer Comments: Image acquisition challenging due to patient body habitus. IMPRESSIONS  1. Left ventricular ejection fraction, by estimation, is 50 to 55%. The left ventricle has low normal function. The left ventricle has no regional wall motion abnormalities. Left ventricular diastolic parameters were normal.  2. Right ventricular systolic function is normal. The right ventricular size is normal. There is moderately elevated pulmonary artery systolic pressure.  3. The mitral valve is normal in structure. Trivial mitral valve regurgitation. No evidence of mitral stenosis.  4. The aortic valve is tricuspid. Aortic valve regurgitation is not visualized. No aortic stenosis is present. Comparison(s): No prior Echocardiogram. FINDINGS  Left Ventricle: Left ventricular ejection fraction, by estimation, is 50 to 55%. The left ventricle has  low normal function. The left ventricle has no regional wall motion abnormalities. Definity  contrast agent was given IV to delineate the left ventricular endocardial borders. The left ventricular internal cavity size was normal in size. There is no left ventricular hypertrophy. Left ventricular diastolic parameters were normal. Right Ventricle: The right ventricular size is normal. Right ventricular systolic function is normal. There is moderately elevated pulmonary artery systolic pressure.  The tricuspid regurgitant velocity is 3.56 m/s, and with an assumed right atrial pressure of 3 mmHg, the estimated right ventricular systolic pressure is 53.7 mmHg. Left Atrium: Left atrial size was normal in size. Right Atrium: Right atrial size was normal in size. Pericardium: There is no evidence of pericardial effusion. Mitral Valve: The mitral valve is normal in structure. Trivial mitral valve regurgitation. No evidence of mitral valve stenosis. MV peak gradient, 4.7 mmHg. The mean mitral valve gradient is 1.5 mmHg. Tricuspid Valve: The tricuspid valve is normal in structure. Tricuspid valve regurgitation is mild . No evidence of tricuspid stenosis. Aortic Valve: The aortic valve is tricuspid. Aortic valve regurgitation is not visualized. No aortic stenosis is present. Aortic valve mean gradient measures 3.8 mmHg. Aortic valve peak gradient measures 7.0 mmHg. Aortic valve area, by VTI measures 1.97 cm. Pulmonic Valve: The pulmonic valve was normal in structure. Pulmonic valve regurgitation is mild. No evidence of pulmonic stenosis. Aorta: The aortic root is normal in size and structure. Venous: The inferior vena cava was not well visualized. IAS/Shunts: The interatrial septum was not well visualized.  LEFT VENTRICLE PLAX 2D LVIDd:         5.20 cm      Diastology LVIDs:         3.50 cm      LV e' medial:    8.27 cm/s LV PW:         0.90 cm      LV E/e' medial:  10.7 LV IVS:        0.90 cm      LV e' lateral:   10.23 cm/s  LVOT diam:     1.90 cm      LV E/e' lateral: 8.7 LV SV:         52 LV SV Index:   21 LVOT Area:     2.84 cm  LV Volumes (MOD) LV vol d, MOD A2C: 185.0 ml LV vol d, MOD A4C: 200.0 ml LV vol s, MOD A2C: 76.6 ml LV vol s, MOD A4C: 85.4 ml LV SV MOD A2C:     108.4 ml LV SV MOD A4C:     200.0 ml LV SV MOD BP:      107.3 ml RIGHT VENTRICLE RV Basal diam:  4.00 cm RV Mid diam:    3.10 cm RV S prime:     13.50 cm/s TAPSE (M-mode): 2.4 cm LEFT ATRIUM           Index        RIGHT ATRIUM           Index LA diam:      3.80 cm 1.57 cm/m   RA Area:     19.80 cm LA Vol (A2C): 71.9 ml 29.66 ml/m  RA Volume:   50.80 ml  20.95 ml/m LA Vol (A4C): 45.4 ml 18.73 ml/m  AORTIC VALVE                    PULMONIC VALVE AV Area (Vmax):    2.26 cm     PV Vmax:          1.04 m/s AV Area (Vmean):   2.22 cm     PV Peak grad:     4.3 mmHg AV Area (VTI):     1.97 cm     PR End Diast Vel: 2.90 msec AV Vmax:           132.25 cm/s AV Vmean:  84.950 cm/s AV VTI:            0.261 m AV Peak Grad:      7.0 mmHg AV Mean Grad:      3.8 mmHg LVOT Vmax:         105.20 cm/s LVOT Vmean:        66.400 cm/s LVOT VTI:          0.182 m LVOT/AV VTI ratio: 0.70  AORTA Ao Root diam: 3.30 cm Ao Asc diam:  2.80 cm MITRAL VALVE               TRICUSPID VALVE MV Area (PHT): 4.99 cm    TR Peak grad:   50.7 mmHg MV Area VTI:   1.91 cm    TR Vmax:        356.00 cm/s MV Peak grad:  4.7 mmHg MV Mean grad:  1.5 mmHg    SHUNTS MV Vmax:       1.09 m/s    Systemic VTI:  0.18 m MV Vmean:      60.8 cm/s   Systemic Diam: 1.90 cm MV Decel Time: 152 msec MV E velocity: 88.80 cm/s MV A velocity: 97.90 cm/s MV E/A ratio:  0.91 Redell Shallow MD Electronically signed by Redell Shallow MD Signature Date/Time: 04/15/2023/1:03:34 PM    Final    DG Toe 2nd Left Result Date: 04/15/2023 CLINICAL DATA:  Evaluate for osteomyelitis.  Fall. EXAM: LEFT SECOND TOE COMPARISON:  None Available. 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 No acute  bony abnormality. Specifically, no fracture, subluxation, or dislocation. No bone destruction to suggest osteomyelitis. Degenerative changes in the midfoot. IMPRESSION: No acute bony abnormality. Electronically Signed   By: Franky Crease M.D.   On: 04/15/2023 00:51   DG Chest Port 1 View Result Date: 04/15/2023 CLINICAL DATA:  Questionable sepsis - evaluate for abnormality. Fall. EXAM: PORTABLE CHEST 1 VIEW COMPARISON:  05/13/2009 FINDINGS: Left pacer in place with leads in the right atrium and right ventricle. Cardiomegaly, vascular congestion. No confluent opacity, overt edema or effusions. No pneumothorax. No acute bony abnormality. IMPRESSION: Cardiomegaly, vascular congestion. Electronically Signed   By: Franky Crease M.D.   On: 04/15/2023 00:48   CT Head Wo Contrast Result Date: 04/14/2023 CLINICAL DATA:  Head trauma, minor (Age >= 65y); Neck trauma (Age >= 65y) EXAM: CT HEAD WITHOUT CONTRAST CT CERVICAL SPINE WITHOUT CONTRAST TECHNIQUE: Multidetector CT imaging of the head and cervical spine was performed following the standard protocol without intravenous contrast. Multiplanar CT image reconstructions of the cervical spine were also generated. RADIATION DOSE REDUCTION: This exam was performed according to the departmental dose-optimization program which includes automated exposure control, adjustment of the mA and/or kV according to patient size and/or use of iterative reconstruction technique. COMPARISON:  None Available. FINDINGS: CT HEAD FINDINGS Brain: Patchy and confluent areas of decreased attenuation are noted throughout the deep and periventricular white matter of the cerebral hemispheres bilaterally, compatible with chronic microvascular ischemic disease. No evidence of large-territorial acute infarction. No parenchymal hemorrhage. No mass lesion. No extra-axial collection. No mass effect or midline shift. No hydrocephalus. Basilar cisterns are patent. Empty sella. Vascular: No hyperdense  vessel. Atherosclerotic calcifications are present within the cavernous internal carotid arteries. Skull: No acute fracture or focal lesion. Sinuses/Orbits: Right mastoidectomy. Left mastoid effusion. Paranasal sinuses are clear. The orbits are unremarkable. Other: None. CT CERVICAL SPINE FINDINGS Alignment: Grade 1 anterolisthesis of C3 on C4 and C4  on C5. Skull base and vertebrae: Multilevel severe degenerative changes of the spine. Associated severe right C2-C3, left C3-C4, right C4-C5 osseous neural foraminal stenosis. No acute fracture. No aggressive appearing focal osseous lesion or focal pathologic process. Soft tissues and spinal canal: No prevertebral fluid or swelling. No visible canal hematoma. Upper chest: Unremarkable. Other: None. IMPRESSION: 1. No acute intracranial abnormality. 2. No acute displaced fracture or traumatic listhesis of the cervical spine. 3. Multilevel severe degenerative changes of the spine. Associated severe right C2-C3, left C3-C4, right C4-C5 osseous neural foraminal stenosis. 4. Empty sella. Findings is often a normal anatomic variant but can be associated with idiopathic intracranial hypertension (pseudotumor cerebri). Electronically Signed   By: Morgane  Naveau M.D.   On: 04/14/2023 19:43   CT Cervical Spine Wo Contrast Result Date: 04/14/2023 CLINICAL DATA:  Head trauma, minor (Age >= 65y); Neck trauma (Age >= 65y) EXAM: CT HEAD WITHOUT CONTRAST CT CERVICAL SPINE WITHOUT CONTRAST TECHNIQUE: Multidetector CT imaging of the head and cervical spine was performed following the standard protocol without intravenous contrast. Multiplanar CT image reconstructions of the cervical spine were also generated. RADIATION DOSE REDUCTION: This exam was performed according to the departmental dose-optimization program which includes automated exposure control, adjustment of the mA and/or kV according to patient size and/or use of iterative reconstruction technique. COMPARISON:  None  Available. FINDINGS: CT HEAD FINDINGS Brain: Patchy and confluent areas of decreased attenuation are noted throughout the deep and periventricular white matter of the cerebral hemispheres bilaterally, compatible with chronic microvascular ischemic disease. No evidence of large-territorial acute infarction. No parenchymal hemorrhage. No mass lesion. No extra-axial collection. No mass effect or midline shift. No hydrocephalus. Basilar cisterns are patent. Empty sella. Vascular: No hyperdense vessel. Atherosclerotic calcifications are present within the cavernous internal carotid arteries. Skull: No acute fracture or focal lesion. Sinuses/Orbits: Right mastoidectomy. Left mastoid effusion. Paranasal sinuses are clear. The orbits are unremarkable. Other: None. CT CERVICAL SPINE FINDINGS Alignment: Grade 1 anterolisthesis of C3 on C4 and C4 on C5. Skull base and vertebrae: Multilevel severe degenerative changes of the spine. Associated severe right C2-C3, left C3-C4, right C4-C5 osseous neural foraminal stenosis. No acute fracture. No aggressive appearing focal osseous lesion or focal pathologic process. Soft tissues and spinal canal: No prevertebral fluid or swelling. No visible canal hematoma. Upper chest: Unremarkable. Other: None. IMPRESSION: 1. No acute intracranial abnormality. 2. No acute displaced fracture or traumatic listhesis of the cervical spine. 3. Multilevel severe degenerative changes of the spine. Associated severe right C2-C3, left C3-C4, right C4-C5 osseous neural foraminal stenosis. 4. Empty sella. Findings is often a normal anatomic variant but can be associated with idiopathic intracranial hypertension (pseudotumor cerebri). Electronically Signed   By: Morgane  Naveau M.D.   On: 04/14/2023 19:43   DG Ankle Complete Left Result Date: 04/14/2023 CLINICAL DATA:  Knee gave way patient fell. EXAM: LEFT ANKLE COMPLETE - 3+ VIEW COMPARISON:  None Available. FINDINGS: Mild swelling about the left ankle.  No acute fracture or dislocation. Degenerative changes about the midfoot. IMPRESSION: Mild swelling about the left ankle without acute fracture. Electronically Signed   By: Norman Gatlin M.D.   On: 04/14/2023 19:30   DG Knee Complete 4 Views Right Result Date: 04/14/2023 CLINICAL DATA:  Fall after knee scaphoid EXAM: LEFT KNEE - COMPLETE 4+ VIEW; RIGHT KNEE - COMPLETE 4+ VIEW COMPARISON:  None Available. FINDINGS: No acute fracture or dislocation. 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 There is moderate to advanced medial compartment narrowing on the  left and moderate to advanced medial and lateral compartment narrowing on the right. No knee joint effusion. Vascular calcifications. IMPRESSION: 1. No acute fracture or dislocation. 2. 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 Electronically Signed   By: Norman Gatlin M.D.   On: 04/14/2023 19:28   DG Knee Complete 4 Views Left Result Date: 04/14/2023 CLINICAL DATA:  Fall after knee scaphoid EXAM: LEFT KNEE - COMPLETE 4+ VIEW; RIGHT KNEE - COMPLETE 4+ VIEW COMPARISON:  None Available. FINDINGS: No acute fracture or dislocation. 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 There is moderate to advanced medial compartment narrowing on the left and moderate to advanced medial and lateral compartment narrowing on the right. No knee joint effusion. Vascular calcifications. IMPRESSION: 1. No acute fracture or dislocation. 2. 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 Electronically Signed   By: Norman Gatlin M.D.   On: 04/14/2023 19:28   DG Ankle Complete Right Result Date: 04/14/2023 CLINICAL DATA:  Ankle pain following fall, initial encounter EXAM: RIGHT ANKLE - COMPLETE 3+ VIEW COMPARISON:  None Available. FINDINGS: No acute fracture or dislocation is noted. Flattening of the plantar arch is noted. Tarsal degenerative changes are seen. IMPRESSION: Chronic changes without acute abnormality. Electronically  Signed   By: Oneil Devonshire M.D.   On: 04/14/2023 19:25   DG Hip Unilat With Pelvis 2-3 Views Left Result Date: 04/14/2023 CLINICAL DATA:  Hip pain after fall. EXAM: DG HIP (WITH OR WITHOUT PELVIS) 2-3V RIGHT; DG HIP (WITH OR WITHOUT PELVIS) 2-3V LEFT COMPARISON:  None Available. FINDINGS: Bilateral THA. No radiographic evidence of loosening. No acute fracture or dislocation. Heterotopic ossification about both proximal femurs. Pelvic phleboliths. IMPRESSION: Negative. Electronically Signed   By: Norman Gatlin M.D.   On: 04/14/2023 19:25   DG Hip Unilat With Pelvis 2-3 Views Right Result Date: 04/14/2023 CLINICAL DATA:  Hip pain after fall. EXAM: DG HIP (WITH OR WITHOUT PELVIS) 2-3V RIGHT; DG HIP (WITH OR WITHOUT PELVIS) 2-3V LEFT COMPARISON:  None Available. FINDINGS: Bilateral THA. No radiographic evidence of loosening. No acute fracture or dislocation. Heterotopic ossification about both proximal femurs. Pelvic phleboliths. IMPRESSION: Negative. Electronically Signed   By: Norman Gatlin M.D.   On: 04/14/2023 19:25    Microbiology: Results for orders placed or performed during the hospital encounter of 04/14/23  Blood Culture (routine x 2)     Status: None   Collection Time: 04/15/23  1:07 AM   Specimen: BLOOD RIGHT HAND  Result Value Ref Range Status   Specimen Description BLOOD RIGHT HAND  Final   Special Requests   Final    BOTTLES DRAWN AEROBIC AND ANAEROBIC Blood Culture adequate volume   Culture   Final    NO GROWTH 5 DAYS Performed at [facility], [address] [place], [place] [address]    Report Status 04/20/2023 FINAL  Final  Blood Culture (routine x 2)     Status: Abnormal   Collection Time: 04/15/23  1:07 AM   Specimen: BLOOD RIGHT HAND  Result Value Ref Range Status   Specimen Description BLOOD RIGHT HAND  Final   Special Requests   Final    BOTTLES DRAWN AEROBIC AND ANAEROBIC Blood Culture adequate volume   Culture  Setup Time   Final    GRAM POSITIVE  COCCI ANAEROBIC BOTTLE ONLY CRITICAL RESULT CALLED TO, READ BACK BY AND VERIFIED WITH: PHARMD L. LAURENCE [id number] @ 2204 FH Performed at [facility], [address], [place], [place] [address]  Culture STAPHYLOCOCCUS AUREUS (A)  Final   Report Status 04/17/2023 FINAL  Final   Organism ID, Bacteria STAPHYLOCOCCUS AUREUS  Final      Susceptibility   Staphylococcus aureus - MIC*    CIPROFLOXACIN <=0.5 SENSITIVE Sensitive     ERYTHROMYCIN >=8 RESISTANT Resistant     GENTAMICIN  <=0.5 SENSITIVE Sensitive     OXACILLIN <=0.25 SENSITIVE Sensitive     TETRACYCLINE <=1 SENSITIVE Sensitive     VANCOMYCIN  <=0.5 SENSITIVE Sensitive     TRIMETH/SULFA 160 RESISTANT Resistant     CLINDAMYCIN <=0.25 SENSITIVE Sensitive     RIFAMPIN <=0.5 SENSITIVE Sensitive     Inducible Clindamycin NEGATIVE Sensitive     LINEZOLID 2 SENSITIVE Sensitive     * STAPHYLOCOCCUS AUREUS  Blood Culture ID Panel (Reflexed)     Status: Abnormal   Collection Time: 04/15/23  1:07 AM  Result Value Ref Range Status   Enterococcus faecalis NOT DETECTED NOT DETECTED Final   Enterococcus Faecium NOT DETECTED NOT DETECTED Final   Listeria monocytogenes NOT DETECTED NOT DETECTED Final   Staphylococcus species DETECTED (A) NOT DETECTED Final    Comment: CRITICAL RESULT CALLED TO, READ BACK BY AND VERIFIED WITH: PHARMD L. CHEN [id number] @ 2204 FH    Staphylococcus aureus (BCID) DETECTED (A) NOT DETECTED Final    Comment: CRITICAL RESULT CALLED TO, READ BACK BY AND VERIFIED WITH: PHARMD L. LAURENCE [id number] @ 2204 FH    Staphylococcus epidermidis NOT DETECTED NOT DETECTED Final   Staphylococcus lugdunensis NOT DETECTED NOT DETECTED Final   Streptococcus species NOT DETECTED NOT DETECTED Final   Streptococcus agalactiae NOT DETECTED NOT DETECTED Final   Streptococcus pneumoniae NOT DETECTED NOT DETECTED Final   Streptococcus pyogenes NOT DETECTED NOT DETECTED Final   A.calcoaceticus-baumannii NOT DETECTED NOT DETECTED Final    Bacteroides fragilis NOT DETECTED NOT DETECTED Final   Enterobacterales NOT DETECTED NOT DETECTED Final   Enterobacter cloacae complex NOT DETECTED NOT DETECTED Final   Escherichia coli NOT DETECTED NOT DETECTED Final   Klebsiella aerogenes NOT DETECTED NOT DETECTED Final   Klebsiella oxytoca NOT DETECTED NOT DETECTED Final   Klebsiella pneumoniae NOT DETECTED NOT DETECTED Final   Proteus species NOT DETECTED NOT DETECTED Final   Salmonella species NOT DETECTED NOT DETECTED Final   Serratia marcescens NOT DETECTED NOT DETECTED Final   Haemophilus influenzae NOT DETECTED NOT DETECTED Final   Neisseria meningitidis NOT DETECTED NOT DETECTED Final   Pseudomonas aeruginosa NOT DETECTED NOT DETECTED Final   Stenotrophomonas maltophilia NOT DETECTED NOT DETECTED Final   Candida albicans NOT DETECTED NOT DETECTED Final   Candida auris NOT DETECTED NOT DETECTED Final   Candida glabrata NOT DETECTED NOT DETECTED Final   Candida krusei NOT DETECTED NOT DETECTED Final   Candida parapsilosis NOT DETECTED NOT DETECTED Final   Candida tropicalis NOT DETECTED NOT DETECTED Final   Cryptococcus neoformans/gattii NOT DETECTED NOT DETECTED Final   Meth resistant mecA/C and MREJ NOT DETECTED NOT DETECTED Final    Comment: Performed at [facility], [address], [place], [place] [address]  Urine Culture     Status: Abnormal   Collection Time: 04/15/23  3:49 AM   Specimen: Urine, Clean Catch  Result Value Ref Range Status   Specimen Description URINE, CLEAN CATCH  Final   Special Requests   Final    NONE Performed at [facility], [address], [place], [place] [address]    Culture (A)  Final    70,000 COLONIES/mL  KLEBSIELLA PNEUMONIAE 50,000 COLONIES/mL KLEBSIELLA PNEUMONIAE    Report Status 04/17/2023 FINAL  Final   Organism ID, Bacteria KLEBSIELLA PNEUMONIAE (A)  Final   Organism ID, Bacteria KLEBSIELLA PNEUMONIAE (A)  Final      Susceptibility   Klebsiella pneumoniae - MIC*     AMPICILLIN  >=32 RESISTANT Resistant     CEFAZOLIN  <=4 SENSITIVE Sensitive     CEFEPIME  <=0.12 SENSITIVE Sensitive     CEFTRIAXONE <=0.25 SENSITIVE Sensitive     CIPROFLOXACIN <=0.25 SENSITIVE Sensitive     GENTAMICIN  <=1 SENSITIVE Sensitive     IMIPENEM <=0.25 SENSITIVE Sensitive     NITROFURANTOIN 64 INTERMEDIATE Intermediate     TRIMETH/SULFA <=20 SENSITIVE Sensitive     AMPICILLIN /SULBACTAM >=32 RESISTANT Resistant     PIP/TAZO 8 SENSITIVE Sensitive ug/mL   Klebsiella pneumoniae - MIC*    AMPICILLIN  >=32 RESISTANT Resistant     CEFAZOLIN  <=4 SENSITIVE Sensitive     CEFEPIME  <=0.12 SENSITIVE Sensitive     CEFTRIAXONE <=0.25 SENSITIVE Sensitive     CIPROFLOXACIN <=0.25 SENSITIVE Sensitive     GENTAMICIN  <=1 SENSITIVE Sensitive     IMIPENEM <=0.25 SENSITIVE Sensitive     NITROFURANTOIN <=16 SENSITIVE Sensitive     TRIMETH/SULFA <=20 SENSITIVE Sensitive     AMPICILLIN /SULBACTAM >=32 RESISTANT Resistant     PIP/TAZO 16 SENSITIVE Sensitive ug/mL    * 70,000 COLONIES/mL KLEBSIELLA PNEUMONIAE    50,000 COLONIES/mL KLEBSIELLA PNEUMONIAE  Culture, blood (Routine X 2) w Reflex to ID Panel     Status: None   Collection Time: 04/17/23  6:55 AM   Specimen: BLOOD RIGHT HAND  Result Value Ref Range Status   Specimen Description BLOOD RIGHT HAND  Final   Special Requests   Final    BOTTLES DRAWN AEROBIC AND ANAEROBIC Blood Culture results may not be optimal due to an inadequate volume of blood received in culture bottles   Culture   Final    NO GROWTH 5 DAYS Performed at [facility], [address], [place], [place] [address]    Report Status 04/22/2023 FINAL  Final  Culture, blood (Routine X 2) w Reflex to ID Panel     Status: None   Collection Time: 04/17/23  6:59 AM   Specimen: BLOOD RIGHT ARM  Result Value Ref Range Status   Specimen Description BLOOD RIGHT ARM  Final   Special Requests   Final    BOTTLES DRAWN AEROBIC AND ANAEROBIC Blood Culture results may not be optimal due  to an inadequate volume of blood received in culture bottles   Culture   Final    NO GROWTH 5 DAYS Performed at [facility], [address], [place], [place] [address]    Report Status 04/22/2023 FINAL  Final  Surgical pcr screen     Status: Abnormal   Collection Time: 04/18/23  4:53 AM   Specimen: Nasal Mucosa; Nasal Swab  Result Value Ref Range Status   MRSA, PCR NEGATIVE NEGATIVE Final   Staphylococcus aureus POSITIVE (A) NEGATIVE Final    Comment: (NOTE) The Xpert SA Assay (FDA approved for NASAL specimens in patients 59 years of age and older), is one component of a comprehensive surveillance program. It is not intended to diagnose infection nor to guide or monitor treatment. Performed at [facility], [address], [place], [place] [address]   Aerobic/Anaerobic Culture w Gram Stain (surgical/deep wound)     Status: None (Preliminary result)   Collection Time: 04/18/23  8:07  AM   Specimen: PATH Digit amputation; Tissue  Result Value Ref Range Status   Specimen Description TISSUE  Final   Special Requests left 2nd toe  Final   Gram Stain   Final    RARE WBC PRESENT, PREDOMINANTLY PMN RARE GRAM POSITIVE COCCI IN PAIRS Performed at [facility], [address] [place], [place] [address]    Culture   Final    FEW STAPHYLOCOCCUS AUREUS NO ANAEROBES ISOLATED; CULTURE IN PROGRESS FOR 5 DAYS    Report Status PENDING  Incomplete   Organism ID, Bacteria STAPHYLOCOCCUS AUREUS  Final      Susceptibility   Staphylococcus aureus - MIC*    CIPROFLOXACIN <=0.5 SENSITIVE Sensitive     ERYTHROMYCIN >=8 RESISTANT Resistant     GENTAMICIN  <=0.5 SENSITIVE Sensitive     OXACILLIN <=0.25 SENSITIVE Sensitive     TETRACYCLINE <=1 SENSITIVE Sensitive     VANCOMYCIN  <=0.5 SENSITIVE Sensitive     TRIMETH/SULFA 160 RESISTANT Resistant     CLINDAMYCIN <=0.25 SENSITIVE Sensitive     RIFAMPIN <=0.5 SENSITIVE Sensitive     Inducible Clindamycin NEGATIVE Sensitive      LINEZOLID 2 SENSITIVE Sensitive     * FEW STAPHYLOCOCCUS AUREUS  Surgical PCR screen     Status: Abnormal   Collection Time: 04/21/23  7:52 AM   Specimen: Nasal Mucosa; Nasal Swab  Result Value Ref Range Status   MRSA, PCR NEGATIVE NEGATIVE Final   Staphylococcus aureus POSITIVE (A) NEGATIVE Final    Comment: (NOTE) The Xpert SA Assay (FDA approved for NASAL specimens in patients 64 years of age and older), is one component of a comprehensive surveillance program. It is not intended to diagnose infection nor to guide or monitor treatment. Performed at [facility], [address], [place], [place] [address]     Labs: CBC: Recent Labs  Lab 04/17/23 [phone] 04/18/23 0431 04/21/23 0940 04/22/23 0952  WBC 11.0* 10.3 11.7* 10.1  HGB 13.3 13.3 11.2* 11.2*  HCT 38.3* 38.5* 32.7* 32.1*  MCV 92.1 91.4 92.6 91.2  PLT 214 227 269 277   Basic Metabolic Panel: Recent Labs  Lab 04/18/23 0431 04/20/23 0500 04/21/23 0428 04/21/23 0940 04/22/23 0420  NA 131* 129* 128* 131* 131*  K 4.2 4.2 4.5 4.9 4.1  CL 96* 96* 95* 96* 97*  CO2 23 24 24 25 25   GLUCOSE 223* 173* 186* 159* 108*  BUN 18 19 18 18 18   CREATININE 1.45* 1.38* 1.37* 1.37* 1.39*  CALCIUM 8.5* 8.2* 8.3* 8.2* 8.3*  MG  --   --   --   --  2.1   Liver Function Tests: Recent Labs  Lab 04/21/23 0940 04/22/23 0420  AST 41 34  ALT 20 16  ALKPHOS 78 70  BILITOT 0.9 0.5  PROT 6.2* 7.0  ALBUMIN 1.9* 1.9*   CBG: Recent Labs  Lab 04/22/23 1723 04/22/23 1928 04/22/23 2341 04/23/23 0332 04/23/23 0729  GLUCAP 169* 160* 117* 158* 141*    Discharge time spent: greater than 30 minutes.  Signed: [name], MD [facility] 04/23/2023

## 2023-04-23 NOTE — TOC Transition Note (Signed)
 Transition of Care [facility]) - Discharge Note   Patient Details  Name: Adam Ponce MRN: [id number] Date of Birth: [birth year]  Transition of Care [facility]) CM/SW Contact:  Gwenn Julien [place], [place] Phone Number: 04/23/2023, 12:49 PM   Clinical Narrative: pt for dc to [facility] today. Spoke to [place] at [facility] who confirmed they are prepared to admit pt to room 509. Pt aware of dc and reports agreeable. RN provided with number for report and PTAR arranged for transport. SW signing off at dc.   Julien Gwenn, MSW, LCSW [phone] (coverage)        Final next level of care: Skilled Nursing Facility Barriers to Discharge: Barriers Resolved   Patient Goals and CMS Choice Patient states their goals for this hospitalization and ongoing recovery are:: SNF   Choice offered to / list presented to : Patient      Discharge Placement              Patient chooses bed at: [facility] Patient to be transferred to facility by: PTAR Name of family member notified: [name] Patient and family notified of of transfer: 04/23/23  Discharge Plan and Services Additional resources added to the After Visit Summary for   In-house Referral: Clinical Social Work                                   Social Drivers of Health (SDOH) Interventions SDOH Screenings   Food Insecurity: Food Insecurity Present (04/15/2023)  Housing: High Risk (04/15/2023)  Transportation Needs: Unmet Transportation Needs (04/15/2023)  Utilities: At Risk (04/15/2023)  Depression (PHQ2-9): Low Risk  (03/20/2020)  Recent Concern: Depression (PHQ2-9) - Medium Risk (01/10/2020)  Social Connections: Patient Declined (04/18/2023)  Tobacco Use: Medium Risk (04/18/2023)     Readmission Risk Interventions     No data to display

## 2023-04-23 NOTE — TOC Progression Note (Addendum)
 Transition of Care [facility]) - Progression Note    Patient Details  Name: Adam Ponce MRN: [id number] Date of Birth: [birth year]  Transition of Care [facility]) CM/SW Contact  Gwenn Frieze Hazel, [place] Phone Number: 04/23/2023, 9:35 AM  Clinical Narrative:  confirmed with [facility], auth remains valid through 1/9 as there is a 6 day grace period to admit to SNF. Spoke to [place] at [facility] who confirmed they are prepared to admit pt today. MD updated.   Frieze Gwenn, MSW, LCSW [phone] (coverage)       Expected Discharge Plan: Skilled Nursing Facility Barriers to Discharge: Continued Medical Work up  Expected Discharge Plan and Services In-house Referral: Clinical Social Work     Living arrangements for the past 2 months: Apartment Expected Discharge Date: 04/23/23                                     Social Determinants of Health (SDOH) Interventions SDOH Screenings   Food Insecurity: Food Insecurity Present (04/15/2023)  Housing: High Risk (04/15/2023)  Transportation Needs: Unmet Transportation Needs (04/15/2023)  Utilities: At Risk (04/15/2023)  Depression (PHQ2-9): Low Risk  (03/20/2020)  Recent Concern: Depression (PHQ2-9) - Medium Risk (01/10/2020)  Social Connections: Patient Declined (04/18/2023)  Tobacco Use: Medium Risk (04/18/2023)    Readmission Risk Interventions     No data to display

## 2023-04-25 ENCOUNTER — Telehealth: Payer: Self-pay

## 2023-04-25 ENCOUNTER — Encounter (HOSPITAL_COMMUNITY): Payer: Self-pay | Admitting: Cardiovascular Disease

## 2023-04-25 NOTE — Telephone Encounter (Signed)
 Attempted to call pt. LMTCB.

## 2023-04-25 NOTE — Telephone Encounter (Signed)
-----   Message from Charlies Macario Arthur sent at 04/25/2023 10:31 AM EST -----  ----- Message ----- From: Arthur Charlies Macario, PA-C Sent: 04/24/2023   9:31 AM EST To: [facility] Device  I think this patient was discharged before SK saw him Saturday post PPM removal on 04/22/23 At least I don't see a note from him Looks like he went to a SNF  Can you please follow up? renee

## 2023-04-26 MED FILL — Midazolam HCl Inj 2 MG/2ML (Base Equivalent): INTRAMUSCULAR | Qty: 3 | Status: AC

## 2023-04-26 NOTE — Telephone Encounter (Signed)
 Attempted to contact patient. No answer, LMTCB.  If pt returns call please make sure he has removed large tegaderm from new device site and has no swelling, redness or drainage. Steri-strips or dermabond are to stay in place. Also make sure pt is aware of f/u apt with Renee.

## 2023-04-26 NOTE — Telephone Encounter (Signed)
 I let the patient know that the nurse will give him a call back.

## 2023-04-27 LAB — LAB REPORT - SCANNED: EGFR: 61

## 2023-04-27 NOTE — Telephone Encounter (Signed)
 Outreach made to [employer] at [phone].  Spoke with receptionist.  Was transferred to floor nurse.  Call rang and rang with no answer.  Unable to leave a message.  Will try later.

## 2023-04-28 ENCOUNTER — Ambulatory Visit (INDEPENDENT_AMBULATORY_CARE_PROVIDER_SITE_OTHER): Payer: Medicare PPO | Admitting: Podiatry

## 2023-04-28 ENCOUNTER — Encounter: Payer: Self-pay | Admitting: Podiatry

## 2023-04-28 ENCOUNTER — Telehealth: Payer: Self-pay | Admitting: *Deleted

## 2023-04-28 DIAGNOSIS — Z89422 Acquired absence of other left toe(s): Secondary | ICD-10-CM | POA: Diagnosis not present

## 2023-04-28 NOTE — Progress Notes (Signed)
  Subjective:  Patient ID: Adam Ponce, male    DOB: 15-May-1950,  MRN: [id number]  Chief Complaint  Patient presents with   Routine Post Op    hospital post op/ left 2nd ray amp/ dos 04/18/23, healing well    DOS: 04/18/2023 Procedure: Left second toe amp  73 y.o. male seen for post op check.  First postop check status post above procedures approximately 10 days postop. Denies pain or other concerns.  Review of Systems: Negative except as noted in the HPI. Denies N/V/F/Ch.   Objective:  There were no vitals filed for this visit. There is no height or weight on file to calculate BMI. Constitutional Well developed. Well nourished.  Vascular Foot warm and well perfused. Capillary refill normal to all digits.   No calf pain with palpation  Neurologic Normal speech. Oriented to person, place, and time. 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 Plan:  Patient was evaluated and treated and all questions answered.  POD # 10 s/p L 2nd toe amp at MPJ level -Progressing well, healing ahead of schedule -XR: deferred -WB Status: WBAT in darco shoe -Sutures: removed at this visit. -Medications/ABX: abx as per ID recs -Foot redressed. - Return in 2 weeks        Marolyn JULIANNA Honour, DPM [facility] / [facility]

## 2023-04-28 NOTE — Telephone Encounter (Signed)
 Additional outreach made to [facility].  Was transferred to receptionist.  Unable to speak to nurse, but did obtain fax #.  Fax sent with wound care instructions and reminder regarding upcoming appointment tomorrow 04/29/2023 at 2:45 pm.  Await further needs.

## 2023-04-28 NOTE — Telephone Encounter (Signed)
 Lvm for patient to call back clinic to confirm he can come in earlier or rescheduled appointment  with devic clninic or Renee. This is due to inclement weather.

## 2023-04-29 ENCOUNTER — Encounter: Payer: Self-pay | Admitting: Physician Assistant

## 2023-04-29 ENCOUNTER — Ambulatory Visit: Payer: Medicare PPO | Attending: Physician Assistant | Admitting: Physician Assistant

## 2023-04-29 ENCOUNTER — Ambulatory Visit: Payer: No Typology Code available for payment source | Admitting: Physician Assistant

## 2023-04-29 VITALS — BP 129/66 | HR 71 | Resp 16 | Ht 71.0 in | Wt 275.6 lb

## 2023-04-29 DIAGNOSIS — I480 Paroxysmal atrial fibrillation: Secondary | ICD-10-CM

## 2023-04-29 DIAGNOSIS — Z5189 Encounter for other specified aftercare: Secondary | ICD-10-CM

## 2023-04-29 NOTE — Progress Notes (Signed)
 Cardiology Office Note:  .   Date:  04/29/2023  ID:  Adam Ponce, DOB 11/17/1950, MRN [id number] PCP: System, Provider Not In  [facility] Providers Cardiologist:  None {  History of Present Illness: .   Adam Ponce is a 73 y.o. 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 His prior L toe amputation site has been draining for the past 3 weeks with increased pain while walking.  Urine culture has since grown two different klebsiella pneumoniae with different sensitivities, so thought to be contamination.  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  He underwent TEE 12/31 given positive blood cultures. It showed no endocarditis, no vegetation or thrombus on PPM wires, LVEF 45--50% with global hypokinesis.  ID noted TEE was negative but of course the device will be infected with this organism being in the blood EP was brought on board given MSSA bacteremia Device was extracted on 04/22/23 ID recommended 6 weeks of antibiotics post removal He was discharged 04/22/22 > SNF before EP service could see him/evaluate his wound post implant  In d/w Dr. Nancey, ideally would look to re-implant an abbot leadless device In review of his care every where chart pacing indication was SND, though CHB also described With pacing reduced pre-removal he has SB/competing junctional rhythm, rates 40's-SR w/1:1 conduction 70's-80's >> not dependent Planned to see Dr. Nancey 06/09/22 for timing of re-implant  Today's visit is scheduled as an early wound check  ROS:   Generally reports that he is tired, weak, no energy No near syncope or syncope since discharge No CP, palpitations,  SOB He is currently at [facility] for rehab, tells me that on 05/02/23 he is being transferred to the [facility] rehab in [place].  He is very happy about that, his care will be assumed there, that is where all of his doctors are.  Device information  System has been removed Device: BSC Accolade MRI EL L331 SN# [id number] DOI: 09/16/2022 RA Lead: BSC 7841 Ingevity SN# [id number] DOI: 09/16/2022 LBAP: BSC 7842 Ingevity SN# [id number] DOI: 09/16/2022   Arrhythmia/AAD hx Hx of AFlutter is reported, historically on warfarin, unclear >> eventually a/c was stopped out patient  Studies Reviewed: SABRA    EKG done tdoay and reviewed by myself SR 63bpm, diffuse T changes appear unchanged from prior  Risk Assessment/Calculations:    Physical Exam:   VS:  There were no vitals taken for this visit.   Wt Readings from Last 3 Encounters:  04/18/23 277 lb 12.5 oz (126 kg)  10/28/21 278 lb (126.1 kg)  03/18/20 280 lb 10.3 oz (127.3 kg)    GEN: Well nourished, well developed in no acute distress NECK: No JVD; No carotid bruits CARDIAC: RRR, no murmurs, rubs, gallops RESPIRATORY:   CTA b/l without rales, wheezing or rhonchi  ABDOMEN: Soft, non-tender, non-distended EXTREMITIES:  No edema; , L foot with orthopedic boot  Site: steri strips are removed without difficulty, wound edges are well approximated/healed, site is stable, with erythema, edema, heat, no thinning, fluctuation, tethering  ASSESSMENT AND PLAN: .    SND/CHB by history > PPM removed 2/2 MSSA bacteremia Site is well healed No signs  of infection Advised gentle washing/soap, no scrubbing, pat dray  There is an appt in place with Dr. Nancey if needed He tells me though that next week he will be transferred to the [facility]'s rehab facility and they will pick back up his care there  Advised ER if any syncope  Afib/flutter No longer on a/c via his primary team at the [facility]    Dispo: as scheduled, sooner if needed  Signed, Charlies Macario Arthur, PA-C

## 2023-04-29 NOTE — Patient Instructions (Signed)
 Medication Instructions:   Your physician recommends that you continue on your current medications as directed. Please refer to the Current Medication list given to you today.   *If you need a refill on your cardiac medications before your next appointment, please call your pharmacy*   Lab Work:  NONE ORDERED  TODAY    If you have labs (blood work) drawn today and your tests are completely normal, you will receive your results only by: MyChart Message (if you have MyChart) OR A paper copy in the mail If you have any lab test that is abnormal or we need to change your treatment, we will call you to review the results.   Testing/Procedures:  NONE ORDERED  TODAY      Follow-Up: At [facility], you and your health needs are our priority.  As part of our continuing mission to provide you with exceptional heart care, we have created designated Provider Care Teams.  These Care Teams include your primary Cardiologist (physician) and Advanced Practice Providers (APPs -  Physician Assistants and Nurse Practitioners) who all work together to provide you with the care you need, when you need it.  We recommend signing up for the patient portal called MyChart.  Sign up information is provided on this After Visit Summary.  MyChart is used to connect with patients for Virtual Visits (Telemedicine).  Patients are able to view lab/test results, encounter notes, upcoming appointments, etc.  Non-urgent messages can be sent to your provider as well.   To learn more about what you can do with MyChart, go to [email or web address].    Your next appointment:  AS SCHEDULED   Provider:   You may see  Dr. Nancey    Other Instructions

## 2023-05-10 ENCOUNTER — Telehealth: Payer: Self-pay

## 2023-05-10 NOTE — Telephone Encounter (Signed)
Patient called to schedule hospital follow up appt with Dr. Daiva Eves. States that he belives he is ending IV antbx around 2/3 and wanted to be seen. Informed pt that he has upcoming appt on 2/5 with Dr. Luciana Axe assured pt that he should still be on antbx per opat (2/13). Per pt request called [facility] SNF and relayed appt information. Requested weekly labs be faxed to triage [phone]. Juanita Laster, RMA

## 2023-05-11 LAB — LAB REPORT - SCANNED: EGFR: 54

## 2023-05-25 ENCOUNTER — Encounter: Payer: Self-pay | Admitting: Internal Medicine

## 2023-05-25 ENCOUNTER — Ambulatory Visit (INDEPENDENT_AMBULATORY_CARE_PROVIDER_SITE_OTHER): Payer: Medicare PPO | Admitting: Internal Medicine

## 2023-05-25 ENCOUNTER — Other Ambulatory Visit: Payer: Self-pay

## 2023-05-25 VITALS — BP 127/61 | HR 50 | Temp 98.0°F

## 2023-05-25 DIAGNOSIS — R7881 Bacteremia: Secondary | ICD-10-CM

## 2023-05-25 DIAGNOSIS — M869 Osteomyelitis, unspecified: Secondary | ICD-10-CM

## 2023-05-25 DIAGNOSIS — M86172 Other acute osteomyelitis, left ankle and foot: Secondary | ICD-10-CM

## 2023-05-25 DIAGNOSIS — T827XXA Infection and inflammatory reaction due to other cardiac and vascular devices, implants and grafts, initial encounter: Secondary | ICD-10-CM

## 2023-05-25 DIAGNOSIS — B9561 Methicillin susceptible Staphylococcus aureus infection as the cause of diseases classified elsewhere: Secondary | ICD-10-CM | POA: Diagnosis not present

## 2023-05-25 DIAGNOSIS — B20 Human immunodeficiency virus [HIV] disease: Secondary | ICD-10-CM

## 2023-05-25 DIAGNOSIS — Z452 Encounter for adjustment and management of vascular access device: Secondary | ICD-10-CM | POA: Insufficient documentation

## 2023-05-25 DIAGNOSIS — T827XXD Infection and inflammatory reaction due to other cardiac and vascular devices, implants and grafts, subsequent encounter: Secondary | ICD-10-CM

## 2023-05-25 NOTE — Assessment & Plan Note (Signed)
 He has been doing well with this and we will have him return to see me in about 2 weeks and can check his labs at that time and continue HIV follow-up until he is able to get back to the [facility] or decides to stay in care here.  Will continue with Prezcobix  and Descovy .

## 2023-05-25 NOTE — Assessment & Plan Note (Signed)
 He is post explantation of the pacemaker and does have follow-up with EP later this month.  Will check his blood cultures at his follow-up visit with me.

## 2023-05-25 NOTE — Assessment & Plan Note (Signed)
 His PICC line is working well with no concerns.  This can be removed after his last dose on February 13.

## 2023-05-25 NOTE — Progress Notes (Addendum)
   Subjective:    Patient ID: Adam Ponce, male    DOB: [birth year], 73 y.o.   MRN: [id number]  HPI Adam Ponce is here for follow-up of his recent hospitalization for MSSA bacteremia in the setting of a permanent pacemaker. He had a fall and brought into the ED and to have toe osteomyelitis as well and underwent amputation.  He developed MSSA bacteremia and did have his pacemaker removed.  Plan put in place for 6 weeks of IV antibiotics following explantation which is due to and on February 13.  He remains in facility while on antibiotics and is doing well.  He has noted no fever or chills.  PICC line is working well with no concerns. He also has HIV and is followed by the [facility] and is on Prezcobix  and Descovy  without any issues.  He has follow-up with cardiology later this month.   Review of Systems  Constitutional:  Negative for fatigue.  Gastrointestinal:  Negative for diarrhea.  Skin:  Negative for rash.       Objective:   Physical Exam Eyes:     General: No scleral icterus. Pulmonary:     Effort: Pulmonary effort is normal.  Neurological:     Mental Status: He is alert.   SH: lives in a facility        Assessment & Plan:   See problem list

## 2023-05-25 NOTE — Assessment & Plan Note (Signed)
 Bacteremia noted and follow-up blood cultures did remain negative.  I will have him return in about 3 weeks to recheck his blood cultures for surveillance off antibiotics after completing his course.

## 2023-05-25 NOTE — Assessment & Plan Note (Signed)
 He is now status post amputation and no new concerns

## 2023-06-01 ENCOUNTER — Telehealth: Payer: Self-pay

## 2023-06-01 NOTE — Telephone Encounter (Signed)
Laurie<PT, with [employer]. She stated that the patient told her that he doesn't need to wear the post op boot anymore. She would like to clarify that.  Jacki Cones [phone]

## 2023-06-02 NOTE — Telephone Encounter (Signed)
Tried calling Adam Ponce,but no answer and unable to leave a message.

## 2023-06-08 ENCOUNTER — Encounter: Payer: Self-pay | Admitting: Internal Medicine

## 2023-06-08 ENCOUNTER — Ambulatory Visit (INDEPENDENT_AMBULATORY_CARE_PROVIDER_SITE_OTHER): Payer: Medicare PPO | Admitting: Internal Medicine

## 2023-06-08 ENCOUNTER — Other Ambulatory Visit: Payer: Self-pay

## 2023-06-08 VITALS — BP 163/80 | HR 44 | Ht 71.0 in | Wt 275.0 lb

## 2023-06-08 DIAGNOSIS — T827XXD Infection and inflammatory reaction due to other cardiac and vascular devices, implants and grafts, subsequent encounter: Secondary | ICD-10-CM | POA: Diagnosis not present

## 2023-06-08 DIAGNOSIS — B9561 Methicillin susceptible Staphylococcus aureus infection as the cause of diseases classified elsewhere: Secondary | ICD-10-CM

## 2023-06-08 DIAGNOSIS — R531 Weakness: Secondary | ICD-10-CM

## 2023-06-08 DIAGNOSIS — M869 Osteomyelitis, unspecified: Secondary | ICD-10-CM

## 2023-06-08 DIAGNOSIS — R7881 Bacteremia: Secondary | ICD-10-CM

## 2023-06-08 DIAGNOSIS — Z452 Encounter for adjustment and management of vascular access device: Secondary | ICD-10-CM

## 2023-06-08 NOTE — Assessment & Plan Note (Signed)
 Has been removed and no concerns at the PICC line site

## 2023-06-08 NOTE — Progress Notes (Signed)
 Patient ID: Adam Ponce, male   DOB: 10-May-1950, 73 y.o.   MRN: [id number]   Subjective:    Patient ID: Adam Ponce, male    DOB: 06-Dec-1950, 73 y.o.   MRN: [id number]  HPI Adam Ponce is here for follow-up of MSSA bacteremia in the setting of a pacemaker, status post extraction. He had a fall and brought into the ED and to have toe osteomyelitis as well and underwent amputation.  He developed MSSA bacteremia and did have his pacemaker removed.  Plan put in place for 6 weeks of IV antibiotics following explantation and completed 6 weeks of treatment last week.  He is currently having no fever or chills.  He is followed at the [facility] for his HIV.  He does state he is still feeling weak and having difficulty walking which is atypical from before this infection.  He is no longer in physical therapy after being discharged from rehab.   Review of Systems  Constitutional:  Negative for fatigue.  Gastrointestinal:  Negative for diarrhea.  Skin:  Negative for rash.       Objective:   Physical Exam Eyes:     General: No scleral icterus. Pulmonary:     Effort: Pulmonary effort is normal.  Neurological:     Mental Status: He is alert.   SH: no longer in rehab        Assessment & Plan:   See problem list

## 2023-06-08 NOTE — Assessment & Plan Note (Signed)
 Has ongoing weakness and slow to recover his previous abilities walking and getting around.  I discussed with him to consider talking to his primary physician at the [facility] and getting further physical therapy as an outpatient.  I also discussed using the walker and continue to improve his strength and balance by walking and exercising.

## 2023-06-08 NOTE — Assessment & Plan Note (Signed)
 His pacemaker is out and I will repeat surveillance blood cultures just to be sure there is no circulating bacteria and otherwise okay from an ID standpoint for reimplantation when indicated.  Will otherwise follow-up here as needed

## 2023-06-08 NOTE — Assessment & Plan Note (Signed)
 He is doing well now with no systemic symptoms concerning for ongoing infection.

## 2023-06-10 ENCOUNTER — Ambulatory Visit: Payer: Medicare PPO | Attending: Cardiovascular Disease | Admitting: Cardiovascular Disease

## 2023-06-10 ENCOUNTER — Encounter: Payer: Self-pay | Admitting: Cardiovascular Disease

## 2023-06-10 VITALS — BP 110/48 | HR 54 | Ht 71.0 in | Wt 261.2 lb

## 2023-06-10 DIAGNOSIS — I480 Paroxysmal atrial fibrillation: Secondary | ICD-10-CM | POA: Diagnosis not present

## 2023-06-10 DIAGNOSIS — T827XXD Infection and inflammatory reaction due to other cardiac and vascular devices, implants and grafts, subsequent encounter: Secondary | ICD-10-CM | POA: Diagnosis not present

## 2023-06-10 DIAGNOSIS — I509 Heart failure, unspecified: Secondary | ICD-10-CM | POA: Diagnosis not present

## 2023-06-10 DIAGNOSIS — R55 Syncope and collapse: Secondary | ICD-10-CM | POA: Diagnosis not present

## 2023-06-10 NOTE — Patient Instructions (Signed)
 Medication Instructions:  Your physician recommends that you continue on your current medications as directed. Please refer to the Current Medication list given to you today. *If you need a refill on your cardiac medications before your next appointment, please call your pharmacy*   Lab Work: CBC and BMET - Please have this completed the week of March 3 - this can be done at [facility] - no appointment required and this does not have to be fasting If you have labs (blood work) drawn today and your tests are completely normal, you will receive your results only by: MyChart Message (if you have MyChart) OR A paper copy in the mail If you have any lab test that is abnormal or we need to change your treatment, we will call you to review the results.   Testing/Procedures: Teacher, early years/pre - scheduled for Tuesday, April 1  Follow-Up: At [facility], you and your health needs are our priority.  As part of our continuing mission to provide you with exceptional heart care, we have created designated Provider Care Teams.  These Care Teams include your primary Cardiologist (physician) and Advanced Practice Providers (APPs -  Physician Assistants and Nurse Practitioners) who all work together to provide you with the care you need, when you need it.  We recommend signing up for the patient portal called "MyChart".  Sign up information is provided on this After Visit Summary.  MyChart is used to connect with patients for Virtual Visits (Telemedicine).  Patients are able to view lab/test results, encounter notes, upcoming appointments, etc.  Non-urgent messages can be sent to your provider as well.   To learn more about what you can do with MyChart, go to [email or web address].    Your next appointment:   We will schedule follow up after your procedure  Provider:   York Pellant, MD

## 2023-06-10 NOTE — H&P (View-Only) (Signed)
  Electrophysiology Office Note:    Date:  06/10/2023   ID:  Adam Ponce, DOB [birth year], MRN [id number]  PCP:  System, Provider Not In   [facility] Providers Cardiologist:  None     Referring MD: No ref. provider found   History of Present Illness:    Adam Ponce is a 73 y.o. 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     Adam Ponce was admitted with MSSA bacteremia due to abscess of the second toe of the left foot in December 2024.  Adam Ponce had a Environmental manager dual-chamber pacemaker placed due to sinus node dysfunction.  The device was explanted due to staph bacteremia.  Prior to implant of the pacemaker, Adam Ponce reports that Adam Ponce was having fatigue and shortness of breath that improved with pacing.  At the time of explant, Adam Ponce was 80% atrial paced.  Adam Ponce reports that after his discharge from the hospital, Adam Ponce has had increased fatigue and shortness of breath.     Today, Adam Ponce is at baseline.  Adam Ponce has no acute complaints.  EKGs/Labs/Other Studies Reviewed Today:     Echocardiogram:  TTE December 2024 EF 50-55%    EKG:   EKG Interpretation Date/Time:  Friday June 10 2023 11:16:19 EST Ventricular Rate:  54 PR Interval:  144 QRS Duration:  84 QT Interval:  432 QTC Calculation: 409 R Axis:   101  Text Interpretation: Sinus bradycardia Rightward axis ST & T wave abnormality, consider inferior ischemia ST & T wave abnormality, consider anterolateral ischemia When compared with ECG of 29-Apr-2023 11:54, Sinus rhythm has replaced Junctional rhythm Questionable change in QRS axis Confirmed by York Pellant [phone]) on 06/10/2023 11:55:32 AM     Physical Exam:    VS:  BP (!) 110/48 (BP Location: Right Arm, Patient Position: Sitting, Cuff Size: Large)   Pulse (!) 54   Ht 5\' 11"  (1.803 m)   Wt  261 lb 3.2 oz (118.5 kg)   SpO2 93%   BMI 36.43 kg/m     Wt Readings from Last 3 Encounters:  06/10/23 261 lb 3.2 oz (118.5 kg)  06/08/23 275 lb (124.7 kg)  04/29/23 275 lb 9.6 oz (125 kg)     GEN:  Well nourished, well developed in no acute distress CARDIAC: RRR, no murmurs, rubs, gallops RESPIRATORY:  Normal work of breathing MUSCULOSKELETAL: no edema    ASSESSMENT & PLAN:     History of sinus bradycardia Patient had a Environmental manager dual-chamber pacemaker in place explanted due to staph bacteremia Fatigue has recurred after pacemaker explant We will plan for pacemaker replacement We discussed options, and I think a leadless system is appropriate given his history of staph bacteremia  I discussed the indication for the procedure and the logistics, risks, potential benefit, and after care. I specifically explained that risks include but are not limited to infection, bleeding,damage to blood vessels, lung, and the heart -- but risk of prolonged hospitalization, need for surgery, or the event of stroke, heart attack, or death are low but not zero.    History of staph bacteremia In setting of osteomyelitis Will plan for leadless pacing system  HIV  CKD      Signed, Maurice Small, MD  06/10/2023 12:09 PM    [facility]

## 2023-06-10 NOTE — Progress Notes (Signed)
  Electrophysiology Office Note:    Date:  06/10/2023   ID:  Adam Ponce, DOB [birth year], MRN [id number]  PCP:  System, Provider Not In   [facility] Providers Cardiologist:  None     Referring MD: No ref. provider found   History of Present Illness:    Adam Ponce is a 73 y.o. 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     He was admitted with MSSA bacteremia due to abscess of the second toe of the left foot in December 2024.  He had a [employer] dual-chamber pacemaker placed due to sinus node dysfunction.  The device was explanted due to staph bacteremia.  Prior to implant of the pacemaker, he reports that he was having fatigue and shortness of breath that improved with pacing.  At the time of explant, he was 80% atrial paced.  He reports that after his discharge from the hospital, he has had increased fatigue and shortness of breath.     Today, he is at baseline.  He has no acute complaints.  EKGs/Labs/Other Studies Reviewed Today:     Echocardiogram:  TTE December 2024 EF 50-55%    EKG:   EKG Interpretation Date/Time:  Friday June 10 2023 11:16:19 EST Ventricular Rate:  54 PR Interval:  144 QRS Duration:  84 QT Interval:  432 QTC Calculation: 409 R Axis:   101  Text Interpretation: Sinus bradycardia Rightward axis ST & T wave abnormality, consider inferior ischemia ST & T wave abnormality, consider anterolateral ischemia When compared with ECG of 29-Apr-2023 11:54, Sinus rhythm has replaced Junctional rhythm Questionable change in QRS axis Confirmed by York Pellant [phone]) on 06/10/2023 11:55:32 AM     Physical Exam:    VS:  BP (!) 110/48 (BP Location: Right Arm, Patient Position: Sitting, Cuff Size: Large)   Pulse (!) 54   Ht 5\' 11"  (1.803 m)   Wt  261 lb 3.2 oz (118.5 kg)   SpO2 93%   BMI 36.43 kg/m     Wt Readings from Last 3 Encounters:  06/10/23 261 lb 3.2 oz (118.5 kg)  06/08/23 275 lb (124.7 kg)  04/29/23 275 lb 9.6 oz (125 kg)     GEN:  Well nourished, well developed in no acute distress CARDIAC: RRR, no murmurs, rubs, gallops RESPIRATORY:  Normal work of breathing MUSCULOSKELETAL: no edema    ASSESSMENT & PLAN:     History of sinus bradycardia Patient had a Environmental manager dual-chamber pacemaker in place explanted due to staph bacteremia Fatigue has recurred after pacemaker explant We will plan for pacemaker replacement We discussed options, and I think a leadless system is appropriate given his history of staph bacteremia  I discussed the indication for the procedure and the logistics, risks, potential benefit, and after care. I specifically explained that risks include but are not limited to infection, bleeding,damage to blood vessels, lung, and the heart -- but risk of prolonged hospitalization, need for surgery, or the event of stroke, heart attack, or death are low but not zero.    History of staph bacteremia In setting of osteomyelitis Will plan for leadless pacing system  HIV  CKD      Signed, Maurice Small, MD  06/10/2023 12:09 PM    [facility]

## 2023-06-10 NOTE — Addendum Note (Signed)
 Addended by: Sherle Poe R on: 06/10/2023 12:26 PM   Modules accepted: Orders

## 2023-06-13 LAB — CULTURE, BLOOD (SINGLE)
MICRO NUMBER:: 16104788
MICRO NUMBER:: 16104794
Result:: NO GROWTH
Result:: NO GROWTH
SPECIMEN QUALITY:: ADEQUATE
SPECIMEN QUALITY:: ADEQUATE

## 2023-06-20 ENCOUNTER — Telehealth: Payer: Self-pay

## 2023-06-20 DIAGNOSIS — R55 Syncope and collapse: Secondary | ICD-10-CM

## 2023-06-20 NOTE — Telephone Encounter (Signed)
 LM for pt to call back to offer 3/14 for his PPM Implant with Dr. Nelly Laurence.

## 2023-06-21 ENCOUNTER — Telehealth: Payer: Self-pay

## 2023-06-21 NOTE — Telephone Encounter (Signed)
 Called patient to inform him that blood cultures are negative and can follow upas needed. No questions at this time. Juanita Laster, RMA

## 2023-06-21 NOTE — Telephone Encounter (Signed)
 Pt returned my call and he is scheduled for a leadless PPM implant with Dr. Nelly Laurence on 3/14 at 11:30 am. He is very confused about this and doesn't remember coming to our office. I gave him the address and he is coming back on 3/11 for me to sit with him and explain everything to him and I will have him get labs that day.   He will need to stay overnight - he doesn't have anyone that can stay with him.

## 2023-06-21 NOTE — Addendum Note (Signed)
 Addended by: Cleda Mccreedy on: 06/21/2023 05:22 PM   Modules accepted: Orders

## 2023-06-28 ENCOUNTER — Other Ambulatory Visit (HOSPITAL_BASED_OUTPATIENT_CLINIC_OR_DEPARTMENT_OTHER): Payer: Self-pay | Admitting: Cardiovascular Disease

## 2023-06-29 LAB — BASIC METABOLIC PANEL
BUN/Creatinine Ratio: 11 (ref 10–24)
BUN: 20 mg/dL (ref 8–27)
CO2: 20 mmol/L (ref 20–29)
Calcium: 9.5 mg/dL (ref 8.6–10.2)
Chloride: 101 mmol/L (ref 96–106)
Creatinine, Ser: 1.75 mg/dL — ABNORMAL HIGH (ref 0.76–1.27)
Glucose: 68 mg/dL — ABNORMAL LOW (ref 70–99)
Potassium: 4.7 mmol/L (ref 3.5–5.2)
Sodium: 139 mmol/L (ref 134–144)
eGFR: 41 mL/min/{1.73_m2} — ABNORMAL LOW (ref >59)

## 2023-06-29 LAB — CBC
Hematocrit: 38.5 % (ref 37.5–51.0)
Hemoglobin: 12.4 g/dL — ABNORMAL LOW (ref 13.0–17.7)
MCH: 30 pg (ref 26.6–33.0)
MCHC: 32.2 g/dL (ref 31.5–35.7)
MCV: 93 fL (ref 79–97)
Platelets: 152 10*3/uL (ref 150–450)
RBC: 4.14 x10E6/uL (ref 4.14–5.80)
RDW: 14.9 % (ref 11.6–15.4)
WBC: 5.9 10*3/uL (ref 3.4–10.8)

## 2023-06-30 NOTE — Pre-Procedure Instructions (Signed)
 Instructed patient on the following items: Arrival time 0930 Nothing to eat or drink after midnight No meds AM of procedure  Wash with special soap night before and morning of procedure

## 2023-07-01 ENCOUNTER — Ambulatory Visit (HOSPITAL_COMMUNITY): Admitting: Anesthesiology

## 2023-07-01 ENCOUNTER — Ambulatory Visit (HOSPITAL_COMMUNITY)
Admission: RE | Admit: 2023-07-01 | Discharge: 2023-07-02 | Disposition: A | Discharge status: Home / Self Care | Attending: Cardiovascular Disease | Admitting: Cardiovascular Disease

## 2023-07-01 ENCOUNTER — Other Ambulatory Visit: Payer: Self-pay

## 2023-07-01 ENCOUNTER — Ambulatory Visit (HOSPITAL_COMMUNITY)
Admission: RE | Disposition: A | Discharge status: Home / Self Care | Payer: Self-pay | Source: Home / Self Care | Attending: Cardiovascular Disease

## 2023-07-01 ENCOUNTER — Encounter (HOSPITAL_COMMUNITY): Payer: Self-pay | Admitting: Cardiovascular Disease

## 2023-07-01 DIAGNOSIS — R001 Bradycardia, unspecified: Secondary | ICD-10-CM | POA: Diagnosis not present

## 2023-07-01 DIAGNOSIS — Z7901 Long term (current) use of anticoagulants: Secondary | ICD-10-CM | POA: Insufficient documentation

## 2023-07-01 DIAGNOSIS — Z794 Long term (current) use of insulin: Secondary | ICD-10-CM | POA: Insufficient documentation

## 2023-07-01 DIAGNOSIS — Z006 Encounter for examination for normal comparison and control in clinical research program: Secondary | ICD-10-CM | POA: Diagnosis not present

## 2023-07-01 DIAGNOSIS — N1831 Chronic kidney disease, stage 3a: Secondary | ICD-10-CM | POA: Insufficient documentation

## 2023-07-01 DIAGNOSIS — I509 Heart failure, unspecified: Secondary | ICD-10-CM | POA: Diagnosis not present

## 2023-07-01 DIAGNOSIS — I110 Hypertensive heart disease with heart failure: Secondary | ICD-10-CM | POA: Diagnosis not present

## 2023-07-01 DIAGNOSIS — E1122 Type 2 diabetes mellitus with diabetic chronic kidney disease: Secondary | ICD-10-CM | POA: Insufficient documentation

## 2023-07-01 DIAGNOSIS — I495 Sick sinus syndrome: Secondary | ICD-10-CM

## 2023-07-01 DIAGNOSIS — M1990 Unspecified osteoarthritis, unspecified site: Secondary | ICD-10-CM | POA: Insufficient documentation

## 2023-07-01 DIAGNOSIS — Z87891 Personal history of nicotine dependence: Secondary | ICD-10-CM

## 2023-07-01 DIAGNOSIS — I441 Atrioventricular block, second degree: Secondary | ICD-10-CM

## 2023-07-01 DIAGNOSIS — I129 Hypertensive chronic kidney disease with stage 1 through stage 4 chronic kidney disease, or unspecified chronic kidney disease: Secondary | ICD-10-CM | POA: Diagnosis not present

## 2023-07-01 DIAGNOSIS — Z8619 Personal history of other infectious and parasitic diseases: Secondary | ICD-10-CM | POA: Diagnosis not present

## 2023-07-01 DIAGNOSIS — Z7984 Long term (current) use of oral hypoglycemic drugs: Secondary | ICD-10-CM | POA: Insufficient documentation

## 2023-07-01 DIAGNOSIS — Z21 Asymptomatic human immunodeficiency virus [HIV] infection status: Secondary | ICD-10-CM | POA: Insufficient documentation

## 2023-07-01 DIAGNOSIS — J4489 Other specified chronic obstructive pulmonary disease: Secondary | ICD-10-CM | POA: Insufficient documentation

## 2023-07-01 HISTORY — PX: PACEMAKER LEADLESS INSERTION: EP1219

## 2023-07-01 LAB — GLUCOSE, CAPILLARY
Glucose-Capillary: 104 mg/dL — ABNORMAL HIGH (ref 70–99)
Glucose-Capillary: 126 mg/dL — ABNORMAL HIGH (ref 70–99)
Glucose-Capillary: 217 mg/dL — ABNORMAL HIGH (ref 70–99)

## 2023-07-01 SURGERY — PACEMAKER LEADLESS INSERTION
Anesthesia: General

## 2023-07-01 MED ORDER — DEXAMETHASONE SODIUM PHOSPHATE 10 MG/ML IJ SOLN
INTRAMUSCULAR | Status: DC | PRN
  Administered 2023-07-01: 5 mg via INTRAVENOUS

## 2023-07-01 MED ORDER — SUGAMMADEX SODIUM 200 MG/2ML IV SOLN
INTRAVENOUS | Status: DC | PRN
  Administered 2023-07-01: 200 mg via INTRAVENOUS

## 2023-07-01 MED ORDER — FENTANYL CITRATE (PF) 100 MCG/2ML IJ SOLN
INTRAMUSCULAR | Status: AC
  Filled 2023-07-01: qty 2

## 2023-07-01 MED ORDER — IODIXANOL 320 MG/ML IV SOLN
INTRAVENOUS | Status: DC | PRN
  Administered 2023-07-01 (×3): 20 mL

## 2023-07-01 MED ORDER — TRAMADOL HCL 50 MG PO TABS
50.0000 mg | ORAL_TABLET | Freq: Once | ORAL | Status: AC
  Administered 2023-07-01: 50 mg via ORAL
  Filled 2023-07-01: qty 1

## 2023-07-01 MED ORDER — CHLORHEXIDINE GLUCONATE 4 % EX SOLN: Freq: Once | CUTANEOUS | Status: AC

## 2023-07-01 MED ORDER — IOHEXOL 350 MG/ML SOLN: INTRAVENOUS | Status: DC | PRN

## 2023-07-01 MED ORDER — HEPARIN (PORCINE) IN NACL 1000-0.9 UT/500ML-% IV SOLN
INTRAVENOUS | Status: DC | PRN
  Administered 2023-07-01 (×4): 500 mL

## 2023-07-01 MED ORDER — FENTANYL CITRATE (PF) 250 MCG/5ML IJ SOLN
INTRAMUSCULAR | Status: DC | PRN
Start: 2023-07-01 — End: 2023-07-01
  Administered 2023-07-01 (×4): 25 ug via INTRAVENOUS

## 2023-07-01 MED ORDER — PHENYLEPHRINE HCL-NACL 20-0.9 MG/250ML-% IV SOLN
INTRAVENOUS | Status: DC | PRN
  Administered 2023-07-01: 25 ug/min via INTRAVENOUS

## 2023-07-01 MED ORDER — ONDANSETRON HCL 4 MG/2ML IJ SOLN
INTRAMUSCULAR | Status: DC | PRN
  Administered 2023-07-01: 4 mg via INTRAVENOUS

## 2023-07-01 MED ORDER — SODIUM CHLORIDE 0.9 % IV SOLN: 250.0000 mL | INTRAVENOUS | Status: DC | PRN

## 2023-07-01 MED ORDER — PHENYLEPHRINE 80 MCG/ML (10ML) SYRINGE FOR IV PUSH (FOR BLOOD PRESSURE SUPPORT): PREFILLED_SYRINGE | INTRAVENOUS | Status: DC | PRN

## 2023-07-01 MED ORDER — SODIUM CHLORIDE 0.9% FLUSH
3.0000 mL | Freq: Two times a day (BID) | INTRAVENOUS | Status: DC
  Administered 2023-07-01 – 2023-07-02 (×2): 3 mL via INTRAVENOUS

## 2023-07-01 MED ORDER — IPRATROPIUM-ALBUTEROL 0.5-2.5 (3) MG/3ML IN SOLN
RESPIRATORY_TRACT | Status: AC
Start: 2023-07-01 — End: 2023-07-01
  Administered 2023-07-01: 3 mL via RESPIRATORY_TRACT
  Filled 2023-07-01: qty 3

## 2023-07-01 MED ORDER — LIDOCAINE 2% (20 MG/ML) 5 ML SYRINGE
INTRAMUSCULAR | Status: DC | PRN
  Administered 2023-07-01: 60 mg via INTRAVENOUS

## 2023-07-01 MED ORDER — SODIUM CHLORIDE 0.9% FLUSH: 3.0000 mL | Freq: Two times a day (BID) | INTRAVENOUS | Status: DC

## 2023-07-01 MED ORDER — ROCURONIUM BROMIDE 10 MG/ML (PF) SYRINGE
PREFILLED_SYRINGE | INTRAVENOUS | Status: DC | PRN
  Administered 2023-07-01: 50 mg via INTRAVENOUS

## 2023-07-01 MED ORDER — HYDRALAZINE HCL 20 MG/ML IJ SOLN: 10.0000 mg | Freq: Three times a day (TID) | INTRAMUSCULAR | Status: DC | PRN

## 2023-07-01 MED ORDER — IPRATROPIUM-ALBUTEROL 0.5-2.5 (3) MG/3ML IN SOLN: 3.0000 mL | RESPIRATORY_TRACT | Status: AC

## 2023-07-01 MED ORDER — SODIUM CHLORIDE 0.9% FLUSH: 3.0000 mL | INTRAVENOUS | Status: DC | PRN

## 2023-07-01 MED ORDER — CEFAZOLIN SODIUM-DEXTROSE 2-4 GM/100ML-% IV SOLN
2.0000 g | INTRAVENOUS | Status: AC
  Administered 2023-07-01: 2 g via INTRAVENOUS

## 2023-07-01 MED ORDER — HEPARIN SODIUM (PORCINE) 1000 UNIT/ML IJ SOLN
INTRAMUSCULAR | Status: DC | PRN
  Administered 2023-07-01: 3000 [IU] via INTRAVENOUS

## 2023-07-01 MED ORDER — ONDANSETRON HCL 4 MG/2ML IJ SOLN: 4.0000 mg | Freq: Four times a day (QID) | INTRAMUSCULAR | Status: DC | PRN

## 2023-07-01 MED ORDER — PROPOFOL 10 MG/ML IV BOLUS
INTRAVENOUS | Status: DC | PRN
  Administered 2023-07-01: 100 mg via INTRAVENOUS

## 2023-07-01 MED ORDER — CEFAZOLIN SODIUM-DEXTROSE 2-4 GM/100ML-% IV SOLN
INTRAVENOUS | Status: AC
  Filled 2023-07-01: qty 100

## 2023-07-01 MED ORDER — ACETAMINOPHEN 325 MG PO TABS
650.0000 mg | ORAL_TABLET | ORAL | Status: DC | PRN
  Administered 2023-07-01 – 2023-07-02 (×3): 650 mg via ORAL
  Filled 2023-07-01 (×3): qty 2

## 2023-07-01 SURGICAL SUPPLY — 14 items
CABLE SURGICAL S-101-97-12 (CABLE) ×1 IMPLANT
CATH S G BIP PACING (CATHETERS) IMPLANT
CATHETER AVEIR LEADLESS DELIVE (CATHETERS) IMPLANT
CLOSURE PERCLOSE PROSTYLE (VASCULAR PRODUCTS) IMPLANT
PAD DEFIB RADIO PHYSIO CONN (PAD) ×1 IMPLANT
PMKR AVEIR LEADLESS RA 32.2 LS (Pacemaker) IMPLANT
PMKR AVEIR LEADLESS RV 38.0 LS (Pacemaker) IMPLANT
SHEATH AVEIR 25FR 50 (SHEATH) IMPLANT
SHEATH DILAT COONS TAPER 22F (SHEATH) IMPLANT
SHEATH PINNACLE 6F 10CM (SHEATH) IMPLANT
SHEATH PINNACLE 9F 10CM (SHEATH) IMPLANT
SHEATH PROBE COVER 6X72 (BAG) IMPLANT
TRAY PACEMAKER INSERTION (PACKS) ×1 IMPLANT
WIRE AMPLATZ SS-J .035X180CM (WIRE) IMPLANT

## 2023-07-01 NOTE — Discharge Instructions (Signed)
 After Your Leadless Pacemaker  You have a Abbott Pacemaker  Do not squat, lift over 5 lbs, or have sexual activity for 1 week.   Do not drive for 4 days, or until instructed by your healthcare provider that you are safe to do so.   Monitor your surgical site for redness, swelling, and drainage. Call the device clinic at [phone] if you experience these symptoms or fever/chills.  You may shower 1 day after your pacemaker implant and wash around the site with soap and water. Avoid lotions, ointments, or perfumes over your incision until it is well-healed.  You may use a hot tub or a pool AFTER your wound check appointment if the incision is completely closed.  Your Pacemaker may be MRI compatible. We will discuss this at your first follow up/wound check. .   Remote monitoring is used to monitor your pacemaker from home. This monitoring is scheduled every 91 days by our office. It allows [place] to keep an eye on the functioning of your device to ensure it is working properly. You will routinely see your Electrophysiologist annually (more often if necessary).    Pacemaker Implantation, Care After This sheet gives you information about how to care for yourself after your procedure. Your health care provider may also give you more specific instructions. If you have problems or questions, contact your health care provider. What can I expect after the procedure? After the procedure, it is common to have: Mild pain. Slight bruising. Some swelling over the incision. You should received your Pacemaker ID card within 4-8 weeks. Follow these instructions at home: Medicines Take over-the-counter and prescription medicines only as told by your health care provider. If you were prescribed an antibiotic medicine, take it as told by your health care provider. Do not stop taking the antibiotic even if you start to feel better. Wound care  Check the incision site every day to make sure it is not  infected, bleeding, or starting to pull apart. Do not use lotions or ointments near the incision site unless directed to do so. Keep the incision area clean and dry for 7 days after the procedure or as directed by your health care provider. It takes several weeks for the incision site to completely heal. Do not take baths, swim, or use a hot tub until seen in the device clinic.  Activity Do not drive or use heavy machinery while taking prescription pain medicine. Do not drive for 24 hours if you were given a medicine to help you relax (sedative). Check with your health care provider before you start to drive or play sports. You may go back to work when your health care provider says it is okay. Pacemaker care You may be shown how to transfer data from your pacemaker through the phone to your health care provider. Always let all health care providers know about your pacemaker before you have any medical procedures or tests. Wear a medical ID bracelet or necklace stating that you have a pacemaker. Carry a pacemaker ID card with you at all times. Your pacemaker battery will last for 5-15 years. Routine checks by your health care provider will let the health care provider know when the battery is starting to run down. The pacemaker will need to be replaced when the battery starts to run down. Do not use amateur Proofreader. Other electrical devices are safe to use, including power tools, lawn mowers, and speakers. If you are unsure of whether  something is safe to use, ask your health care provider. When using your cell phone, hold it to the ear opposite the pacemaker. Do not leave your cell phone in a pocket over the pacemaker. Avoid places or objects that have a strong electric or magnetic field, including: Scientist, physiological. When at the airport, let officials know that you have a pacemaker. Power plants. Large electrical generators. Radiofrequency transmission  towers, such as cell phone and radio towers. General instructions Weigh yourself every day. If you suddenly gain weight, fluid may be building up in your body. Keep all follow-up visits as told by your health care provider. This is important. Contact a health care provider if: You gain weight suddenly. Your legs or feet swell. It feels like your heart is fluttering or skipping beats (heart palpitations). You have chills or a fever. You have more redness, swelling, or pain around your incisions. You have more fluid or blood coming from your incisions. Your incisions feel warm to the touch. You have pus or a bad smell coming from your incisions. Get help right away if: You have chest pain. You have trouble breathing or are short of breath. You become extremely tired. You are light-headed or you faint. This information is not intended to replace advice given to you by your health care provider. Make sure you discuss any questions you have with your health care provider.

## 2023-07-01 NOTE — Anesthesia Preprocedure Evaluation (Addendum)
 Anesthesia Evaluation  Patient identified by MRN, date of birth, ID band Patient awake    Reviewed: Allergy & Precautions, NPO status , Patient's Chart, lab work & pertinent test results  History of Anesthesia Complications Negative for: history of anesthetic complications  Airway Mallampati: III  TM Distance: >3 FB Neck ROM: Full    Dental  (+) Dental Advisory Given, Edentulous Upper 8.5 teeth on the bottom. Denies loose teeth.:   Pulmonary neg shortness of breath, asthma (last used inhaler ~1 month ago) , sleep apnea (does not use CPAP) , neg COPD, neg recent URI, former smoker   Pulmonary exam normal breath sounds clear to auscultation       Cardiovascular hypertension, (-) angina +CHF (EF 45-50%)  (-) Past MI, (-) Cardiac Stents and (-) CABG + dysrhythmias (bradycardia) + pacemaker (removed due to bacteremia)  Rhythm:Regular Rate:Bradycardia  HLD  TEE 04/19/2023: IMPRESSIONS    1. Left ventricular ejection fraction, by estimation, is 45 to 50%. The  left ventricle has mildly decreased function. The left ventricle  demonstrates global hypokinesis. There is mild left ventricular  hypertrophy.   2. Right ventricular systolic function is normal. The right ventricular  size is normal.   3. No left atrial/left atrial appendage thrombus was detected.   4. Right atrial size was mildly dilated.   5. The mitral valve is grossly normal. Trivial mitral valve  regurgitation.   6. The aortic valve is tricuspid. Aortic valve regurgitation is not  visualized.   7. Agitated saline contrast bubble study was negative, with no evidence  of any interatrial shunt.     Neuro/Psych neg Seizures negative neurological ROS     GI/Hepatic Neg liver ROS,GERD  Medicated,,  Endo/Other  diabetes, Type 2, Insulin Dependent, Oral Hypoglycemic Agents    Renal/GU CRFRenal disease     Musculoskeletal  (+) Arthritis ,    Abdominal  (+) + obese   Peds  Hematology  (+) Blood dyscrasia, anemia , HIV (on HAART)Lab Results      Component                Value               Date                      WBC                      5.9                 06/28/2023                HGB                      12.4 (L)            06/28/2023                HCT                      38.5                06/28/2023                MCV                      93  06/28/2023                PLT                      152                 06/28/2023              Anesthesia Other Findings 73 y.o. 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  Last semaglutide: 06/17/2023  Reproductive/Obstetrics                              Anesthesia Physical Anesthesia Plan  ASA: 3  Anesthesia Plan: General   Post-op Pain Management:    Induction: Intravenous  PONV Risk Score and Plan: 2 and Ondansetron and Dexamethasone  Airway Management Planned: Oral ETT  Additional Equipment:   Intra-op Plan:   Post-operative Plan: Extubation in OR  Informed Consent: I have reviewed the patients History and Physical, chart, labs and discussed the procedure including the risks, benefits and alternatives for the proposed anesthesia with the patient or authorized representative who has indicated his/her understanding and acceptance.     Dental advisory given  Plan Discussed with: CRNA and Anesthesiologist  Anesthesia Plan Comments: (Risks of general anesthesia discussed including, but not limited to, sore throat, hoarse voice, chipped/damaged teeth, injury to vocal cords, nausea and vomiting, allergic reactions, lung infection, heart attack, stroke, and death. All questions answered. )         Anesthesia Quick Evaluation

## 2023-07-01 NOTE — Interval H&P Note (Signed)
 History and Physical Interval Note:  07/01/2023 10:57 AM  Adam Ponce  has presented today for surgery, with the diagnosis of bradicardia.  The various methods of treatment have been discussed with the patient and family. After consideration of risks, benefits and other options for treatment, the patient has consented to  Procedure(s): PACEMAKER LEADLESS INSERTION (N/A) as a surgical intervention.  The patient's history has been reviewed, patient examined, no change in status, stable for surgery.  I have reviewed the patient's chart and labs.  Questions were answered to the patient's satisfaction.     Roberts Gaudy Dantae Meunier

## 2023-07-01 NOTE — Transfer of Care (Signed)
 Immediate Anesthesia Transfer of Care Note  Patient: Adam Ponce  Procedure(s) Performed: PACEMAKER LEADLESS INSERTION  Patient Location: PACU  Anesthesia Type:General  Level of Consciousness: awake and alert   Airway & Oxygen Therapy: Patient Spontanous Breathing  Post-op Assessment: Report given to RN  Post vital signs: Reviewed and stable  Last Vitals:  Vitals Value Taken Time  BP 121/61 07/01/23 1515  Temp    Pulse 60 07/01/23 1517  Resp 17 07/01/23 1517  SpO2 96 % 07/01/23 1517  Vitals shown include unfiled device data.  Last Pain:  Vitals:   07/01/23 1503  TempSrc:   PainSc: 0-No pain         Complications: No notable events documented.

## 2023-07-01 NOTE — Progress Notes (Signed)
 Site area: Left groin a 6 french venous sheath was removed   Site Prior to Removal:  Level 0  Pressure Applied For 20 MINUTES     Manual:   yes  Patient Status During Pull:  stable  Post Pull Groin Site:  Level 0  Post Pull Instructions Given:  Yes.    Post Pull Pulses Present:  Yes.    Dressing Applied:  Yes.    Comments:  Instruction given to patient on applying pressure and signs of bleeding or swelling.

## 2023-07-01 NOTE — Discharge Summary (Signed)
 ELECTROPHYSIOLOGY PROCEDURE DISCHARGE SUMMARY    Patient ID: Adam Ponce,  MRN: [id number], DOB/AGE: 73-Jun-1952 73 y.o.  Admit date: 07/01/2023 Discharge date: 07/02/2023  Primary Care Physician: System, Provider Not In  Primary Cardiologist: None  Electrophysiologist: Dr. 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  Implantation of a Abbott Dual Chamber PPM on 07/01/23 by Dr. Jimmey Ralph. The patient received a dual chamber leadless device.  There were no immediate post procedure complications.   2.  CXR on  07/02/23  demonstrated no pneumothorax status post device implantation.       Brief HPI: Adam Ponce is a 73 y.o. male was referred to electrophysiology in the outpatient setting for consideration of PPM implantation.  Past medical history includes SND s/p PPM, AFL on coumadin, DM II with neuropathy, bilateral toe amputations, CKD IIIa, dementia, OSA, ILD, COPD/Asthma, BPH, HIV, Syphilis.  In 03/2023 he had MSSA bacteremia due to second toe abscess. The patient has had sinus node dysfunction with prior [facility] [employer] dual-chamber PPM and is status post explant due to bacteremia.  Risks, benefits, and alternatives to PPM implantation were reviewed with the patient who wished to proceed.   Hospital Course:  The patient was admitted and underwent implantation of a Abbott  dual chamber leadless / AVEIR  with details as outlined above.  He was monitored on  telemetry overnight which demonstrated appropriate pacing.  Groin sites assessed prior to discharge and are without hematoma or ecchymosis.  The device was interrogated and found to be functioning normally.  Wound care and restrictions were reviewed with the patient.  The patient was examined and considered stable for discharge to home.    Anticoagulation resumption This patient is not on anticoagulation at baseline (not on med list, but verify)    Physical Exam: Vitals:   07/02/23 0357 07/02/23 0457 07/02/23 0807 07/02/23 1100  BP: (!) 130/59 (!) 160/70 (!) 159/85 136/68  Pulse: 60 60 72   Resp:  18 16 18   Temp: 98.4 F (36.9 C) 97.9 F (36.6 C) 97.6 F (36.4 C) 98.5 F (36.9 C)  TempSrc: Oral Oral Oral Oral  SpO2:  96% 98%   Weight:      Height:         Discharge Medications:  Allergies as of 07/02/2023       Reactions   Ciprofloxacin Other (See Comments)   unknown   Ciprofloxacin-dexamethasone Other (See Comments)   unknown   Codeine         Medication List     TAKE these medications    acetaminophen 500 MG tablet Commonly known as: TYLENOL Take 1 tablet (500 mg total) by mouth 4 (four) times daily. What changed:  how much to take when to take this reasons to take this   albuterol 108 (90 Base) MCG/ACT inhaler Commonly known as: VENTOLIN HFA Inhale 2 puffs into the lungs every 6 (six) hours as needed for  wheezing or shortness of breath.   amLODipine 5 MG tablet Commonly known as: NORVASC Take 1 tablet (5 mg total) by mouth daily. Start taking on: July 03, 2023   Azelastine HCl 137 MCG/SPRAY Soln Place 2 sprays into the nose daily. Astepro   bacitracin 500 UNIT/GM ointment Apply 1 Application topically 2 (two) times daily. Small amount   betamethasone valerate 0.1 % cream Commonly known as: VALISONE Apply topically 2 (two) times daily.   cyanocobalamin 1000 MCG/ML injection Commonly known as: VITAMIN B12 Inject 1,000 mcg into the muscle every  30 (thirty) days.   darunavir-cobicistat 800-150 MG tablet Commonly known as: PREZCOBIX Take 1 tablet by mouth daily with breakfast.   diclofenac Sodium 1 % Gel Commonly known as: VOLTAREN Apply 2 g topically daily as needed (pain).   emtricitabine-tenofovir AF 200-25 MG tablet Commonly known as: DESCOVY Take 1 tablet by mouth daily.   famotidine 20 MG tablet Commonly known as: PEPCID Take 10 mg by mouth 2 (two) times daily.   fenofibrate 145 MG tablet Commonly known as: TRICOR Take 145 mg by mouth daily.   hydrocerin Crea Apply 1 Application topically daily as needed (Dry skin).   insulin glargine 100 UNIT/ML Solostar Pen Commonly known as: LANTUS Inject 22 Units into the skin at bedtime.   metFORMIN 500 MG tablet Commonly known as: GLUCOPHAGE Take 500 mg by mouth 2 (two) times daily with a meal.   pantoprazole 40 MG tablet Commonly known as: PROTONIX Take 1 tablet (40 mg total) by mouth 2 (two) times daily for 14 days.   pravastatin 80 MG tablet Commonly known as: PRAVACHOL Take 40 mg by mouth daily.   pregabalin 100 MG capsule Commonly known as: LYRICA Take 1 capsule (100 mg total) by mouth 2 (two) times daily.   Semaglutide (2 MG/DOSE) 8 MG/3ML Sopn Inject 1 mg into the skin once a week. Inject on Thursday   sennosides-docusate sodium 8.6-50 MG tablet Commonly known as: SENOKOT-S Take 2 tablets by mouth daily.   sodium chloride 0.65 % nasal spray Commonly known as: OCEAN Place 2 sprays into the nose daily as needed for congestion.   terazosin 2 MG capsule Commonly known as: HYTRIN Take 1 capsule by mouth at bedtime.   traMADol 50 MG tablet Commonly known as: ULTRAM Take 50 mg by mouth every 6 (six) hours as needed for moderate pain (pain score 4-6).   Vitamin D3 25 MCG (1000 UT) Caps Take 2,000 Units by mouth daily.        Disposition:  Home with usual follow up as in AVS.  Discharge instructions provided and follow up appts are in place.     Duration of Discharge Encounter:  APP time: 20 minutes  Signed, Marcelino Duster, PA-C 07/02/2023, 2:11 PM [phone] [facility] [address] [place], [place] [address]

## 2023-07-02 ENCOUNTER — Other Ambulatory Visit (HOSPITAL_COMMUNITY): Payer: Self-pay

## 2023-07-02 ENCOUNTER — Ambulatory Visit (HOSPITAL_COMMUNITY)

## 2023-07-02 DIAGNOSIS — I129 Hypertensive chronic kidney disease with stage 1 through stage 4 chronic kidney disease, or unspecified chronic kidney disease: Secondary | ICD-10-CM | POA: Diagnosis not present

## 2023-07-02 DIAGNOSIS — Z8619 Personal history of other infectious and parasitic diseases: Secondary | ICD-10-CM | POA: Diagnosis not present

## 2023-07-02 DIAGNOSIS — Z21 Asymptomatic human immunodeficiency virus [HIV] infection status: Secondary | ICD-10-CM | POA: Diagnosis not present

## 2023-07-02 DIAGNOSIS — I441 Atrioventricular block, second degree: Secondary | ICD-10-CM | POA: Diagnosis not present

## 2023-07-02 DIAGNOSIS — R001 Bradycardia, unspecified: Secondary | ICD-10-CM

## 2023-07-02 MED ORDER — AMLODIPINE BESYLATE 5 MG PO TABS
5.0000 mg | ORAL_TABLET | Freq: Every day | ORAL | Status: DC
  Administered 2023-07-02: 5 mg via ORAL
  Filled 2023-07-02: qty 1

## 2023-07-02 MED ORDER — AMLODIPINE BESYLATE 5 MG PO TABS
5.0000 mg | ORAL_TABLET | Freq: Every day | ORAL | 3 refills | Status: AC
  Filled 2023-07-02: qty 30, 30d supply, fill #0
  Filled 2023-07-02: qty 90, 90d supply, fill #0

## 2023-07-02 MED ORDER — ORAL CARE MOUTH RINSE: 15.0000 mL | OROMUCOSAL | Status: DC | PRN

## 2023-07-02 NOTE — Anesthesia Postprocedure Evaluation (Signed)
 Anesthesia Post Note  Patient: Adam Ponce  Procedure(s) Performed: PACEMAKER LEADLESS INSERTION     Patient location during evaluation: Cath Lab Anesthesia Type: General Level of consciousness: awake Pain management: pain level controlled Vital Signs Assessment: post-procedure vital signs reviewed and stable Respiratory status: spontaneous breathing, nonlabored ventilation and respiratory function stable Cardiovascular status: blood pressure returned to baseline and stable Postop Assessment: no apparent nausea or vomiting Anesthetic complications: no   No notable events documented.  Last Vitals:  Vitals:   07/02/23 0457 07/02/23 0807  BP: (!) 160/70 (!) 159/85  Pulse: 60 72  Resp: 18 16  Temp: 36.6 C 36.4 C  SpO2: 96% 98%    Last Pain:  Vitals:   07/02/23 0807  TempSrc: Oral  PainSc:                  Catheryn Bacon Shany Marinez

## 2023-07-02 NOTE — Plan of Care (Signed)
  Problem: Education: Goal: Knowledge of cardiac device and self-care will improve Outcome: Progressing   Problem: Education: Goal: Ability to safely manage health related needs after discharge will improve Outcome: Progressing

## 2023-07-02 NOTE — Progress Notes (Addendum)
       Patient Name: Adam Ponce      SUBJECTIVE:without chest pain or shortness of breath  Past Medical History:  Diagnosis Date   Arthritis    Asthma    Constipation    Diabetes mellitus    Heart disease    HIV disease (HCC) 04/19/1993   Hypertension    Presence of permanent cardiac pacemaker     Scheduled Meds:  Scheduled Meds:  sodium chloride flush  3 mL Intravenous Q12H   Continuous Infusions:  sodium chloride     sodium chloride, acetaminophen, hydrALAZINE, ondansetron (ZOFRAN) IV, mouth rinse, sodium chloride flush    PHYSICAL EXAM Vitals:   07/01/23 1825 07/01/23 1924 07/02/23 0357 07/02/23 0457  BP: (!) 179/96 (!) 155/83 (!) 130/59 (!) 160/70  Pulse:  60 60 60  Resp:  17  18  Temp:  98 F (36.7 C) 98.4 F (36.9 C) 97.9 F (36.6 C)  TempSrc:  Oral Oral Oral  SpO2:    96%  Weight:      Height:         BP (!) 159/85 (BP Location: Left Wrist)   Pulse 72   Temp 97.6 F (36.4 C) (Oral)   Resp 16   Ht 5\' 11"  (1.803 m)   Wt 121.1 kg   SpO2 98%   BMI 37.24 kg/m  Well developed and Morbidly obese in no acute distress HENT normal Neck supple  Clear laterally Regular rate and rhythm, no  gallop 2/6 murmur Abd-soft with active BS No Clubbing cyanosis tr edema Skin-warm and dry A & Oriented  Grossly normal sensory and motor function Stopcock Figure 8 removed  tagaderm placed   ECG AV pacing   TELEMETRY: Reviewed personnally pt in AV pacing:    Intake/Output Summary (Last 24 hours) at 07/02/2023 0748 Last data filed at 07/02/2023 0726 Gross per 24 hour  Intake 337 ml  Output 935 ml  Net -598 ml    LABS: Basic Metabolic Panel: Recent Labs  Lab 06/28/23 1052  NA 139  K 4.7  CL 101  CO2 20  GLUCOSE 68*  BUN 20  CREATININE 1.75*  CALCIUM 9.5   Cardiac Enzymes: No results for input(s): "CKTOTAL", "CKMB", "CKMBINDEX", "TROPONINI" in the last 72 hours. CBC: Recent Labs  Lab 06/28/23 1052  WBC 5.9  HGB 12.4*  HCT 38.5   MCV 93  PLT 152   PROTIME: No results for input(s): "LABPROT", "INR" in the last 72 hours. Liver Function Tests: No results for input(s): "AST", "ALT", "ALKPHOS", "BILITOT", "PROT", "ALBUMIN" in the last 72 hours. No results for input(s): "LIPASE", "AMYLASE" in the last 72 hours. BNP: BNP (last 3 results) Recent Labs    04/15/23 0107  BNP 275.4*       Device Interrogation:normal device function   ASSESSMENT AND PLAN:  Principal Problem:   Bradycardia PM s/p extraction 2/2 staph bacteremia Dementia HIV  HTN   S/p dual chamber leadless implanted  Instructions reviewed  BP have been variable  110 in the office  but was bradycardia  Will begin low dose amlodipine  Ambulate  and can be discharged    Signed, Sherryl Manges MD  07/02/2023

## 2023-07-04 ENCOUNTER — Other Ambulatory Visit (HOSPITAL_COMMUNITY): Payer: Self-pay

## 2023-07-04 ENCOUNTER — Encounter (HOSPITAL_COMMUNITY): Payer: Self-pay | Admitting: Cardiovascular Disease

## 2023-07-05 MED FILL — Fentanyl Citrate Preservative Free (PF) Inj 100 MCG/2ML: INTRAMUSCULAR | Qty: 2 | Status: AC

## 2023-07-13 ENCOUNTER — Ambulatory Visit: Attending: Cardiology

## 2023-07-13 DIAGNOSIS — I509 Heart failure, unspecified: Secondary | ICD-10-CM | POA: Diagnosis not present

## 2023-07-13 NOTE — Patient Instructions (Signed)

## 2023-07-14 LAB — CUP PACEART INCLINIC DEVICE CHECK
Date Time Interrogation Session: 20250326230809
Implantable Pulse Generator Implant Date: 20250314
Pulse Gen Serial Number: 1362217

## 2023-07-14 NOTE — Progress Notes (Signed)
 Error in saving PDF. See scanned media for details.   Normal dual chamber leadless pacemaker wound check. Presenting rhythm: VS. Wound well healed. Routine testing performed. Thresholds, sensing, and impedances consistent with implant measurements. RV pulse amplitude decreased from 2.5V to 2.0V. RA pulse width amplitude programmed from 2.5V to 1.5V. PVC response programmed from off to atrial pace.  No episodes.   Pt enrolled in remote follow-up.  All changes made were by Roxy Cedar, [employer].

## 2023-07-25 ENCOUNTER — Encounter: Payer: Self-pay | Admitting: Cardiovascular Disease

## 2023-08-16 NOTE — Progress Notes (Signed)
 The ASCVD Risk score (Arnett DK, et al., 2019) failed to calculate for the following reasons:   The valid total cholesterol range is 130 to 320 mg/dL  Arlon Bergamo, BSN, RN

## 2023-10-02 NOTE — Progress Notes (Deleted)
  Cardiology Office Note:  .   Date:  10/02/2023  ID:  Adam Ponce, DOB 24-May-1950, MRN [id number] PCP: System, Provider Not In  [facility] Providers Cardiologist:  None Electrophysiologist:  Adam Grange, MD {  History of Present Illness: .   Adam Ponce is a 73 y.o. 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 His prior L toe amputation site has been draining for the past 3 weeks with increased pain while walking.  Urine culture has since grown two different klebsiella pneumoniae with different sensitivities, so thought to be contamination.  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  He underwent TEE 12/31 given positive blood cultures. It showed no endocarditis, no vegetation or thrombus on PPM wires, LVEF 45--50% with global hypokinesis.  ID noted TEE was negative but of course the device will be infected with this organism being in the blood EP was brought on board given MSSA bacteremia Device was extracted on 04/22/23 ID recommended 6 weeks of antibiotics post removal He was discharged 04/22/22 > SNF before EP service could see him/evaluate his wound post implant  SUBSEQUENTLY  [employer] dual chamber leadless devices implanted 07/01/23  ROS:   *** device *** [facility], ?us   Device information [employer] dual chamber leadless implanted 07/01/23  Arrhythmia/AAD hx Hx of AFlutter is reported, historically on warfarin, unclear >> eventually a/c was stopped out patient  Studies Reviewed: Adam Ponce    EKG not done today  Device interrogation done today and reviewed by myself ***   Risk Assessment/Calculations:    Physical Exam:   VS:  There were  no vitals taken for this visit.   Wt Readings from Last 3 Encounters:  07/01/23 267 lb (121.1 kg)  06/10/23 261 lb 3.2 oz (118.5 kg)  06/08/23 275 lb (124.7 kg)    GEN: Well nourished, well developed in no acute distress NECK: No JVD; No carotid bruits CARDIAC: *** RRR, no murmurs, rubs, gallops RESPIRATORY: *** CTA b/l without rales, wheezing or rhonchi  ABDOMEN: Soft, non-tender, non-distended EXTREMITIES:  *** No edema; , L foot with orthopedic boot  ASSESSMENT AND PLAN: .    SND/CHB by history > transvenous PPM removed 2/2 MSSA bacteremia       Subsequent leadless device implant       *** intact function       *** no programming changes made   Afib/flutter No longer on a/c *** via his primary team at the [place]    Dispo: as scheduled, sooner if needed  Signed, Adam Fails, PA-C

## 2023-10-04 ENCOUNTER — Ambulatory Visit: Admitting: Physician Assistant

## 2023-10-11 ENCOUNTER — Other Ambulatory Visit (HOSPITAL_COMMUNITY): Payer: Self-pay

## 2023-10-11 DIAGNOSIS — J8410 Pulmonary fibrosis, unspecified: Secondary | ICD-10-CM

## 2024-01-09 ENCOUNTER — Telehealth (HOSPITAL_COMMUNITY): Payer: Self-pay

## 2024-01-09 NOTE — Telephone Encounter (Signed)
 Outside/paper referral received by Dr. Akunor from [place].   [id number]  Confirmed patient interest, he states he has attended [facility] cardiac rehab in the past. Will pass to nurse navigator for review.

## 2024-01-10 ENCOUNTER — Telehealth (HOSPITAL_COMMUNITY): Payer: Self-pay

## 2024-01-10 NOTE — Telephone Encounter (Signed)
 Called patient to see if he was interested in participating in the Cardiac Rehab Program. Patient will come in for orientation on 10/09 and will attend the 11:45 exercise class.  Pensions consultant.

## 2024-01-24 ENCOUNTER — Telehealth (HOSPITAL_COMMUNITY): Payer: Self-pay

## 2024-01-24 NOTE — Telephone Encounter (Signed)
 Msg left attempting to confirm cardiac rehab appointment for 01/26/24 @ 0930.

## 2024-01-26 ENCOUNTER — Encounter (HOSPITAL_COMMUNITY)
Admission: RE | Admit: 2024-01-26 | Discharge: 2024-01-26 | Disposition: A | Discharge status: Home / Self Care | Source: Ambulatory Visit | Attending: Cardiology | Admitting: Cardiology

## 2024-01-26 VITALS — BP 124/70 | HR 64 | Ht 71.0 in | Wt 257.7 lb

## 2024-01-26 DIAGNOSIS — I5022 Chronic systolic (congestive) heart failure: Secondary | ICD-10-CM | POA: Insufficient documentation

## 2024-01-26 NOTE — Progress Notes (Signed)
 Cardiac Individual Treatment Plan  Patient Details  Name: Adam Ponce MRN: [id number] Date of Birth: 1950-07-28 Referring Provider:   Flowsheet Row INTENSIVE CARDIAC REHAB ORIENT from 01/26/2024 in [facility]  Referring Provider Dr. Burley MD (Dr. [name] MD covering)    Initial Encounter Date:  Flowsheet Row INTENSIVE CARDIAC REHAB ORIENT from 01/26/2024 in [facility]  Date 01/26/24    Visit Diagnosis: Heart failure, chronic systolic (HCC)  Patient's Home Medications on Admission:  Current Outpatient Medications:    acetaminophen  (TYLENOL ) 500 MG tablet, Take 1 tablet (500 mg total) by mouth 4 (four) times daily. (Patient taking differently: Take 650 mg by mouth every 6 (six) hours as needed for mild pain (pain score 1-3) or moderate pain (pain score 4-6).), Disp: , Rfl:    albuterol  (PROVENTIL  HFA;VENTOLIN  HFA) 108 (90 Base) MCG/ACT inhaler, Inhale 2 puffs into the lungs every 6 (six) hours as needed for wheezing or shortness of breath., Disp: , Rfl:    amLODipine  (NORVASC ) 5 MG tablet, Take 1 tablet (5 mg total) by mouth daily., Disp: 90 tablet, Rfl: 3   Azelastine  HCl 137 MCG/SPRAY SOLN, Place 2 sprays into the nose daily. Astepro , Disp: , Rfl:    bacitracin 500 UNIT/GM ointment, Apply 1 Application topically 2 (two) times daily. Small amount, Disp: , Rfl:    betamethasone valerate (VALISONE) 0.1 % cream, Apply topically 2 (two) times daily., Disp: , Rfl:    Cholecalciferol (VITAMIN D3) 25 MCG (1000 UT) CAPS, Take 2,000 Units by mouth daily., Disp: , Rfl:    cyanocobalamin (VITAMIN B12) 1000 MCG/ML injection, Inject 1,000 mcg into the muscle every 30 (thirty) days., Disp: , Rfl:    darunavir -cobicistat  (PREZCOBIX ) 800-150 MG tablet, Take 1 tablet by mouth daily with breakfast., Disp: , Rfl:    diclofenac Sodium (VOLTAREN) 1 % GEL, Apply 2 g topically daily as  needed (pain)., Disp: , Rfl:    emtricitabine -tenofovir  AF (DESCOVY ) 200-25 MG tablet, Take 1 tablet by mouth daily., Disp: , Rfl:    famotidine (PEPCID) 20 MG tablet, Take 10 mg by mouth 2 (two) times daily., Disp: , Rfl:    fenofibrate (TRICOR) 145 MG tablet, Take 145 mg by mouth daily., Disp: , Rfl:    hydrocerin (EUCERIN) CREA, Apply 1 Application topically daily as needed (Dry skin)., Disp: , Rfl:    insulin  glargine (LANTUS ) 100 UNIT/ML Solostar Pen, Inject 22 Units into the skin at bedtime., Disp: , Rfl:    metFORMIN (GLUCOPHAGE) 500 MG tablet, Take 500 mg by mouth 2 (two) times daily with a meal., Disp: , Rfl:    pantoprazole  (PROTONIX ) 40 MG tablet, Take 1 tablet (40 mg total) by mouth 2 (two) times daily for 14 days., Disp: , Rfl:    pravastatin (PRAVACHOL) 80 MG tablet, Take 40 mg by mouth daily., Disp: , Rfl:    pregabalin  (LYRICA ) 100 MG capsule, Take 1 capsule (100 mg total) by mouth 2 (two) times daily., Disp: 6 capsule, Rfl: 0   Semaglutide, 2 MG/DOSE, 8 MG/3ML SOPN, Inject 1 mg into the skin once a week. Inject on Thursday, Disp: , Rfl:    sennosides-docusate sodium  (SENOKOT-S) 8.6-50 MG tablet, Take 2 tablets by mouth daily., Disp: , Rfl:    sodium chloride  (OCEAN) 0.65 % nasal spray, Place 2 sprays into the nose daily as needed for congestion., Disp: , Rfl:    terazosin (HYTRIN)  2 MG capsule, Take 1 capsule by mouth at bedtime. 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   Hypertension    Presence of permanent cardiac pacemaker     Tobacco Use: Social History   Tobacco Use  Smoking Status Former   Current packs/day: 1.00   Types: Cigarettes   Smokeless Tobacco Never    Labs: Review Flowsheet       Latest Ref Rng & Units 04/15/2023  Labs for ITP Cardiac and Pulmonary Rehab  Hemoglobin A1c 4.8 - 5.6 % 8.6     Capillary Blood Glucose: Lab Results  Component Value Date   GLUCAP 217 (H) 07/01/2023   GLUCAP 126 (H) 07/01/2023   GLUCAP 104 (H) 07/01/2023   GLUCAP 178 (H) 04/23/2023   GLUCAP 125 (H) 04/23/2023     Exercise Target Goals: Exercise Program Goal: Individual exercise prescription set using results from initial 6 min walk test and THRR while considering  patient's activity barriers and safety.   Exercise Prescription Goal: Initial exercise prescription builds to 30-45 minutes a day of aerobic activity, 2-3 days per week.  Home exercise guidelines will be given to patient during program as part of exercise prescription that the participant will acknowledge.  Activity Barriers & Risk Stratification:  Activity Barriers & Cardiac Risk Stratification - 01/26/24 1133       Activity Barriers & Cardiac Risk Stratification   Activity Barriers Joint Problems;Assistive Device;Arthritis;Deconditioning;Muscular Weakness;Left Hip Replacement;Right Hip Replacement;Decreased Ventricular Function;Balance Concerns;History of Falls    Cardiac Risk Stratification High          6 Minute Walk:  6 Minute Walk     Row Name 01/26/24 1043         6 Minute Walk   Phase Initial  NUSTEP test     Distance 853 feet     Walk Time 6 minutes     # of Rest Breaks 3     MPH 1.61     METS 1.16     RPE 11     Perceived Dyspnea  0     VO2 Peak 4.07     Symptoms Yes (comment)     Comments Pt has chronic pain: bilateral knee, foot pain, right wrist pain. No pain resolves completely . x3 rest break on Nustep due to left knee pain 10/10. Total steps 295, 0.26km, AVG 6 watts     Resting HR 64 bpm     Resting BP 124/70     Resting Oxygen Saturation  97 %     Exercise Oxygen Saturation  during 6 min walk 95 %     Max Ex. HR 73 bpm      Max Ex. BP 122/78     2 Minute Post BP 124/78        Oxygen Initial Assessment:   Oxygen Re-Evaluation:   Oxygen Discharge (Final Oxygen Re-Evaluation):   Initial Exercise Prescription:  Initial Exercise Prescription - 01/26/24 1100       Date of Initial Exercise RX and Referring Provider   Date 01/26/24    Referring Provider Dr. Burley MD (  Dr. [name] MD covering)    Expected Discharge Date 04/18/24      NuStep   Level 1    SPM 55    Minutes 20    METs 1.3      Prescription Details   Frequency (times per week) 3    Duration Progress to 30 minutes of continuous aerobic without signs/symptoms of physical distress      Intensity   THRR 40-80% of Max Heartrate 59-118    Ratings of Perceived Exertion 11-13    Perceived Dyspnea 0-4      Progression   Progression Continue to progress workloads to maintain intensity without signs/symptoms of physical distress.      Resistance Training   Training Prescription Yes    Weight 2    Reps 10-15          Perform Capillary Blood Glucose checks as needed.  Exercise Prescription Changes:   Exercise Comments:   Exercise Goals and Review:   Exercise Goals     Row Name 01/26/24 1136             Exercise Goals   Increase Physical Activity Yes       Intervention Provide advice, education, support and counseling about physical activity/exercise needs.;Develop an individualized exercise prescription for aerobic and resistive training based on initial evaluation findings, risk stratification, comorbidities and participant's personal goals.       Expected Outcomes Short Term: Attend rehab on a regular basis to increase amount of physical activity.;Long Term: Add in home exercise to make exercise part of routine and to increase amount of physical activity.;Long Term: Exercising regularly at least 3-5 days a week.       Increase Strength and Stamina Yes       Intervention Provide advice, education, support and  counseling about physical activity/exercise needs.;Develop an individualized exercise prescription for aerobic and resistive training based on initial evaluation findings, risk stratification, comorbidities and participant's personal goals.       Expected Outcomes Short Term: Increase workloads from initial exercise prescription for resistance, speed, and METs.;Short Term: Perform resistance training exercises routinely during rehab and add in resistance training at home;Long Term: Improve cardiorespiratory fitness, muscular endurance and strength as measured by increased METs and functional capacity ( )       Able to understand and use rate of perceived exertion (RPE) scale Yes       Intervention Provide education and explanation on how to use RPE scale       Expected Outcomes Short Term: Able to use RPE daily in rehab to express subjective intensity level;Long Term:  Able to use RPE to guide intensity level when exercising independently       Knowledge and understanding of Target Heart Rate Range (THRR) Yes       Intervention Provide education and explanation of THRR including how the numbers were predicted and where they are located for reference       Expected Outcomes Short Term: Able to state/look up THRR;Long Term: Able to use THRR to govern intensity when exercising independently;Short Term: Able to use daily as guideline for intensity in rehab       Understanding of Exercise Prescription Yes       Intervention Provide education, explanation, and written materials on patient's individual exercise prescription       Expected Outcomes Short Term: Able to explain program exercise prescription;Long Term: Able to explain home exercise prescription to exercise independently  Exercise Goals Re-Evaluation :   Discharge Exercise Prescription (Final Exercise Prescription Changes):   Nutrition:  Target Goals: Understanding of nutrition guidelines, daily intake of sodium 1500mg ,  cholesterol 200mg , calories 30% from fat and 7% or less from saturated fats, daily to have 5 or more servings of fruits and vegetables.  Biometrics:  Pre Biometrics - 01/26/24 1124       Pre Biometrics   Waist Circumference 48 inches    Hip Circumference 50.5 inches    Waist to Hip Ratio 0.95 %    Triceps Skinfold 26 mm    % Body Fat 36.5 %    Grip Strength 20 kg    Flexibility --   Did not attempt, chronic pain   Single Leg Stand --   Did not attempt, pt uses cane          Nutrition Therapy Plan and Nutrition Goals:   Nutrition Assessments:  MEDIFICTS Score Key: >=70 Need to make dietary changes  40-70 Heart Healthy Diet <= 40 Therapeutic Level Cholesterol Diet    Picture Your Plate Scores: <59 Unhealthy dietary pattern with much room for improvement. 41-50 Dietary pattern unlikely to meet recommendations for good health and room for improvement. 51-60 More healthful dietary pattern, with some room for improvement.  >60 Healthy dietary pattern, although there may be some specific behaviors that could be improved.    Nutrition Goals Re-Evaluation:   Nutrition Goals Re-Evaluation:   Nutrition Goals Discharge (Final Nutrition Goals Re-Evaluation):   Psychosocial: Target Goals: Acknowledge presence or absence of significant depression and/or stress, maximize coping skills, provide positive support system. Participant is able to verbalize types and ability to use techniques and skills needed for reducing stress and depression.  Initial Review & Psychosocial Screening:  Initial Psych Review & Screening - 01/26/24 1138       Initial Review   Current issues with Current Sleep Concerns;Current Stress Concerns    Source of Stress Concerns Family;Chronic Illness      Family Dynamics   Good Support System? Yes   He has a few friends, siblings, counciler with the [employer]     Barriers   Psychosocial barriers to participate in program There are no identifiable barriers or  psychosocial needs.      Screening Interventions   Interventions To provide support and resources with identified psychosocial needs;Provide feedback about the scores to participant    Expected Outcomes Long Term Goal: Stressors or current issues are controlled or eliminated.;Short Term goal: Identification and review with participant of any Quality of Life or Depression concerns found by scoring the questionnaire.;Long Term goal: The participant improves quality of Life and PHQ9 Scores as seen by post scores and/or verbalization of changes          Quality of Life Scores:  Quality of Life - 01/26/24 1139       Quality of Life   Select Quality of Life      Quality of Life Scores   Health/Function Pre 19.23 %    Socioeconomic Pre 27.14 %    Psych/Spiritual Pre 28.07 %    Family Pre 23 %    GLOBAL Pre 23.24 %         Scores of 19 and below usually indicate a poorer quality of life in these areas.  A difference of  2-3 points is a clinically meaningful difference.  A difference of 2-3 points in the total score of the Quality of Life Index has been associated with significant improvement  in overall quality of life, self-image, physical symptoms, and general health in studies assessing change in quality of life.  PHQ-9: Review Flowsheet       01/26/2024 03/20/2020 01/10/2020  Depression screen PHQ 2/9  Decreased Interest 1 0 2  Down, Depressed, Hopeless 1 0 1  PHQ - 2 Score 2 0 3  Altered sleeping 2 - 1  Tired, decreased energy 1 0 1  Change in appetite 1 0 2  Feeling bad or failure about yourself  2 0 1  Trouble concentrating 2 0 0  Moving slowly or fidgety/restless 1 0 0  Suicidal thoughts 0 0 0  PHQ-9 Score 11 - 8  Difficult doing work/chores Not difficult at all Not difficult at all Somewhat difficult   Interpretation of Total Score  Total Score Depression Severity:  1-4 = Minimal depression, 5-9 = Mild depression, 10-14 = Moderate depression, 15-19 = Moderately severe  depression, 20-27 = Severe depression   Psychosocial Evaluation and Intervention:   Psychosocial Re-Evaluation:   Psychosocial Discharge (Final Psychosocial Re-Evaluation):   Vocational Rehabilitation: Provide vocational rehab assistance to qualifying candidates.   Vocational Rehab Evaluation & Intervention:  Vocational Rehab - 01/26/24 1334       Initial Vocational Rehab Evaluation & Intervention   Assessment shows need for Vocational Rehabilitation No   Retired         Education: Education Goals: Education classes will be provided on a weekly basis, covering required topics. Participant will state understanding/return demonstration of topics presented.     Core Videos: Exercise    Move It!  Clinical staff conducted group or individual video education with verbal and written material and guidebook.  Patient learns the recommended Pritikin exercise program. Exercise with the goal of living a long, healthy life. Some of the health benefits of exercise include controlled diabetes, healthier blood pressure levels, improved cholesterol levels, improved heart and lung capacity, improved sleep, and better body composition. Everyone should speak with their doctor before starting or changing an exercise routine.  Biomechanical Limitations Clinical staff conducted group or individual video education with verbal and written material and guidebook.  Patient learns how biomechanical limitations can impact exercise and how we can mitigate and possibly overcome limitations to have an impactful and balanced exercise routine.  Body Composition Clinical staff conducted group or individual video education with verbal and written material and guidebook.  Patient learns that body composition (ratio of muscle mass to fat mass) is a key component to assessing overall fitness, rather than body weight alone. Increased fat mass, especially visceral belly fat, can put us  at increased risk for  metabolic syndrome, type 2 diabetes, heart disease, and even death. It is recommended to combine diet and exercise (cardiovascular and resistance training) to improve your body composition. Seek guidance from your physician and exercise physiologist before implementing an exercise routine.  Exercise Action Plan Clinical staff conducted group or individual video education with verbal and written material and guidebook.  Patient learns the recommended strategies to achieve and enjoy long-term exercise adherence, including variety, self-motivation, self-efficacy, and positive decision making. Benefits of exercise include fitness, good health, weight management, more energy, better sleep, less stress, and overall well-being.  Medical   Heart Disease Risk Reduction Clinical staff conducted group or individual video education with verbal and written material and guidebook.  Patient learns our heart is our most vital organ as it circulates oxygen, nutrients, white blood cells, and hormones throughout the entire body, and carries waste away. Data supports  a plant-based eating plan like the Pritikin Program for its effectiveness in slowing progression of and reversing heart disease. The video provides a number of recommendations to address heart disease.   Metabolic Syndrome and Belly Fat  Clinical staff conducted group or individual video education with verbal and written material and guidebook.  Patient learns what metabolic syndrome is, how it leads to heart disease, and how one can reverse it and keep it from coming back. You have metabolic syndrome if you have 3 of the following 5 criteria: abdominal obesity, high blood pressure, high triglycerides, low HDL cholesterol, and high blood sugar.  Hypertension and Heart Disease Clinical staff conducted group or individual video education with verbal and written material and guidebook.  Patient learns that high blood pressure, or hypertension, is very common  in the [place] . Hypertension is largely due to excessive salt intake, but other important risk factors include being overweight, physical inactivity, drinking too much alcohol, smoking, and not eating enough potassium from fruits and vegetables. High blood pressure is a leading risk factor for heart attack, stroke, congestive heart failure, dementia, kidney failure, and premature death. Long-term effects of excessive salt intake include stiffening of the arteries and thickening of heart muscle and organ damage. Recommendations include ways to reduce hypertension and the risk of heart disease.  Diseases of Our Time - Focusing on Diabetes Clinical staff conducted group or individual video education with verbal and written material and guidebook.  Patient learns why the best way to stop diseases of our time is prevention, through food and other lifestyle changes. Medicine (such as prescription pills and surgeries) is often only a Band-Aid on the problem, not a long-term solution. Most common diseases of our time include obesity, type 2 diabetes, hypertension, heart disease, and cancer. The Pritikin Program is recommended and has been proven to help reduce, reverse, and/or prevent the damaging effects of metabolic syndrome.  Nutrition   Overview of the Pritikin Eating Plan  Clinical staff conducted group or individual video education with verbal and written material and guidebook.  Patient learns about the Pritikin Eating Plan for disease risk reduction. The Pritikin Eating Plan emphasizes a wide variety of unrefined, minimally-processed carbohydrates, like fruits, vegetables, whole grains, and legumes. Go, Caution, and Stop food choices are explained. Plant-based and lean animal proteins are emphasized. Rationale provided for low sodium intake for blood pressure control, low added sugars for blood sugar stabilization, and low added fats and oils for coronary artery disease risk reduction and weight  management.  Calorie Density  Clinical staff conducted group or individual video education with verbal and written material and guidebook.  Patient learns about calorie density and how it impacts the Pritikin Eating Plan. Knowing the characteristics of the food you choose will help you decide whether those foods will lead to weight gain or weight loss, and whether you want to consume more or less of them. Weight loss is usually a side effect of the Pritikin Eating Plan because of its focus on low calorie-dense foods.  Label Reading  Clinical staff conducted group or individual video education with verbal and written material and guidebook.  Patient learns about the Pritikin recommended label reading guidelines and corresponding recommendations regarding calorie density, added sugars, sodium content, and whole grains.  Dining Out - Part 1  Clinical staff conducted group or individual video education with verbal and written material and guidebook.  Patient learns that restaurant meals can be sabotaging because they can be so high in calories,  fat, sodium, and/or sugar. Patient learns recommended strategies on how to positively address this and avoid unhealthy pitfalls.  Facts on Fats  Clinical staff conducted group or individual video education with verbal and written material and guidebook.  Patient learns that lifestyle modifications can be just as effective, if not more so, as many medications for lowering your risk of heart disease. A Pritikin lifestyle can help to reduce your risk of inflammation and atherosclerosis (cholesterol build-up, or plaque, in the artery walls). Lifestyle interventions such as dietary choices and physical activity address the cause of atherosclerosis. A review of the types of fats and their impact on blood cholesterol levels, along with dietary recommendations to reduce fat intake is also included.  Nutrition Action Plan  Clinical staff conducted group or individual  video education with verbal and written material and guidebook.  Patient learns how to incorporate Pritikin recommendations into their lifestyle. Recommendations include planning and keeping personal health goals in mind as an important part of their success.  Healthy Mind-Set    Healthy Minds, Bodies, Hearts  Clinical staff conducted group or individual video education with verbal and written material and guidebook.  Patient learns how to identify when they are stressed. Video will discuss the impact of that stress, as well as the many benefits of stress management. Patient will also be introduced to stress management techniques. The way we think, act, and feel has an impact on our hearts.  How Our Thoughts Can Heal Our Hearts  Clinical staff conducted group or individual video education with verbal and written material and guidebook.  Patient learns that negative thoughts can cause depression and anxiety. This can result in negative lifestyle behavior and serious health problems. Cognitive behavioral therapy is an effective method to help control our thoughts in order to change and improve our emotional outlook.  Additional Videos:  Exercise    Improving Performance  Clinical staff conducted group or individual video education with verbal and written material and guidebook.  Patient learns to use a non-linear approach by alternating intensity levels and lengths of time spent exercising to help burn more calories and lose more body fat. Cardiovascular exercise helps improve heart health, metabolism, hormonal balance, blood sugar control, and recovery from fatigue. Resistance training improves strength, endurance, balance, coordination, reaction time, metabolism, and muscle mass. Flexibility exercise improves circulation, posture, and balance. Seek guidance from your physician and exercise physiologist before implementing an exercise routine and learn your capabilities and proper form for all  exercise.  Introduction to Yoga  Clinical staff conducted group or individual video education with verbal and written material and guidebook.  Patient learns about yoga, a discipline of the coming together of mind, breath, and body. The benefits of yoga include improved flexibility, improved range of motion, better posture and core strength, increased lung function, weight loss, and positive self-image. Yoga's heart health benefits include lowered blood pressure, healthier heart rate, decreased cholesterol and triglyceride levels, improved immune function, and reduced stress. Seek guidance from your physician and exercise physiologist before implementing an exercise routine and learn your capabilities and proper form for all exercise.  Medical   Aging: Enhancing Your Quality of Life  Clinical staff conducted group or individual video education with verbal and written material and guidebook.  Patient learns key strategies and recommendations to stay in good physical health and enhance quality of life, such as prevention strategies, having an advocate, securing a Health Care Proxy and Power of Attorney, and keeping a list of medications and  system for tracking them. It also discusses how to avoid risk for bone loss.  Biology of Weight Control  Clinical staff conducted group or individual video education with verbal and written material and guidebook.  Patient learns that weight gain occurs because we consume more calories than we burn (eating more, moving less). Even if your body weight is normal, you may have higher ratios of fat compared to muscle mass. Too much body fat puts you at increased risk for cardiovascular disease, heart attack, stroke, type 2 diabetes, and obesity-related cancers. In addition to exercise, following the Pritikin Eating Plan can help reduce your risk.  Decoding Lab Results  Clinical staff conducted group or individual video education with verbal and written material and  guidebook.  Patient learns that lab test reflects one measurement whose values change over time and are influenced by many factors, including medication, stress, sleep, exercise, food, hydration, pre-existing medical conditions, and more. It is recommended to use the knowledge from this video to become more involved with your lab results and evaluate your numbers to speak with your doctor.   Diseases of Our Time - Overview  Clinical staff conducted group or individual video education with verbal and written material and guidebook.  Patient learns that according to the CDC, 50% to 70% of chronic diseases (such as obesity, type 2 diabetes, elevated lipids, hypertension, and heart disease) are avoidable through lifestyle improvements including healthier food choices, listening to satiety cues, and increased physical activity.  Sleep Disorders Clinical staff conducted group or individual video education with verbal and written material and guidebook.  Patient learns how good quality and duration of sleep are important to overall health and well-being. Patient also learns about sleep disorders and how they impact health along with recommendations to address them, including discussing with a physician.  Nutrition  Dining Out - Part 2 Clinical staff conducted group or individual video education with verbal and written material and guidebook.  Patient learns how to plan ahead and communicate in order to maximize their dining experience in a healthy and nutritious manner. Included are recommended food choices based on the type of restaurant the patient is visiting.   Fueling a Banker conducted group or individual video education with verbal and written material and guidebook.  There is a strong connection between our food choices and our health. Diseases like obesity and type 2 diabetes are very prevalent and are in large-part due to lifestyle choices. The Pritikin Eating Plan  provides plenty of food and hunger-curbing satisfaction. It is easy to follow, affordable, and helps reduce health risks.  Menu Workshop  Clinical staff conducted group or individual video education with verbal and written material and guidebook.  Patient learns that restaurant meals can sabotage health goals because they are often packed with calories, fat, sodium, and sugar. Recommendations include strategies to plan ahead and to communicate with the manager, chef, or server to help order a healthier meal.  Planning Your Eating Strategy  Clinical staff conducted group or individual video education with verbal and written material and guidebook.  Patient learns about the Pritikin Eating Plan and its benefit of reducing the risk of disease. The Pritikin Eating Plan does not focus on calories. Instead, it emphasizes high-quality, nutrient-rich foods. By knowing the characteristics of the foods, we choose, we can determine their calorie density and make informed decisions.  Targeting Your Nutrition Priorities  Clinical staff conducted group or individual video education with verbal and written material and  guidebook.  Patient learns that lifestyle habits have a tremendous impact on disease risk and progression. This video provides eating and physical activity recommendations based on your personal health goals, such as reducing LDL cholesterol, losing weight, preventing or controlling type 2 diabetes, and reducing high blood pressure.  Vitamins and Minerals  Clinical staff conducted group or individual video education with verbal and written material and guidebook.  Patient learns different ways to obtain key vitamins and minerals, including through a recommended healthy diet. It is important to discuss all supplements you take with your doctor.   Healthy Mind-Set    Smoking Cessation  Clinical staff conducted group or individual video education with verbal and written material and guidebook.   Patient learns that cigarette smoking and tobacco addiction pose a serious health risk which affects millions of people. Stopping smoking will significantly reduce the risk of heart disease, lung disease, and many forms of cancer. Recommended strategies for quitting are covered, including working with your doctor to develop a successful plan.  Culinary   Becoming a Set designer conducted group or individual video education with verbal and written material and guidebook.  Patient learns that cooking at home can be healthy, cost-effective, quick, and puts them in control. Keys to cooking healthy recipes will include looking at your recipe, assessing your equipment needs, planning ahead, making it simple, choosing cost-effective seasonal ingredients, and limiting the use of added fats, salts, and sugars.  Cooking - Breakfast and Snacks  Clinical staff conducted group or individual video education with verbal and written material and guidebook.  Patient learns how important breakfast is to satiety and nutrition through the entire day. Recommendations include key foods to eat during breakfast to help stabilize blood sugar levels and to prevent overeating at meals later in the day. Planning ahead is also a key component.  Cooking - Educational psychologist conducted group or individual video education with verbal and written material and guidebook.  Patient learns eating strategies to improve overall health, including an approach to cook more at home. Recommendations include thinking of animal protein as a side on your plate rather than center stage and focusing instead on lower calorie dense options like vegetables, fruits, whole grains, and plant-based proteins, such as beans. Making sauces in large quantities to freeze for later and leaving the skin on your vegetables are also recommended to maximize your experience.  Cooking - Healthy Salads and Dressing Clinical staff  conducted group or individual video education with verbal and written material and guidebook.  Patient learns that vegetables, fruits, whole grains, and legumes are the foundations of the Pritikin Eating Plan. Recommendations include how to incorporate each of these in flavorful and healthy salads, and how to create homemade salad dressings. Proper handling of ingredients is also covered. Cooking - Soups and [employer] - Soups and Desserts Clinical staff conducted group or individual video education with verbal and written material and guidebook.  Patient learns that Pritikin soups and desserts make for easy, nutritious, and delicious snacks and meal components that are low in sodium, fat, sugar, and calorie density, while high in vitamins, minerals, and filling fiber. Recommendations include simple and healthy ideas for soups and desserts.   Overview     The Pritikin Solution Program Overview Clinical staff conducted group or individual video education with verbal and written material and guidebook.  Patient learns that the results of the Pritikin Program have been documented in more than 100 articles  published in peer-reviewed journals, and the benefits include reducing risk factors for (and, in some cases, even reversing) high cholesterol, high blood pressure, type 2 diabetes, obesity, and more! An overview of the three key pillars of the Pritikin Program will be covered: eating well, doing regular exercise, and having a healthy mind-set.  WORKSHOPS  Exercise: Exercise Basics: Building Your Action Plan Clinical staff led group instruction and group discussion with PowerPoint presentation and patient guidebook. To enhance the learning environment the use of posters, models and videos may be added. At the conclusion of this workshop, patients will comprehend the difference between physical activity and exercise, as well as the benefits of incorporating both, into their routine. Patients will  understand the FITT (Frequency, Intensity, Time, and Type) principle and how to use it to build an exercise action plan. In addition, safety concerns and other considerations for exercise and cardiac rehab will be addressed by the presenter. The purpose of this lesson is to promote a comprehensive and effective weekly exercise routine in order to improve patients' overall level of fitness.   Managing Heart Disease: Your Path to a Healthier Heart Clinical staff led group instruction and group discussion with PowerPoint presentation and patient guidebook. To enhance the learning environment the use of posters, models and videos may be added.At the conclusion of this workshop, patients will understand the anatomy and physiology of the heart. Additionally, they will understand how Pritikin's three pillars impact the risk factors, the progression, and the management of heart disease.  The purpose of this lesson is to provide a high-level overview of the heart, heart disease, and how the Pritikin lifestyle positively impacts risk factors.  Exercise Biomechanics Clinical staff led group instruction and group discussion with PowerPoint presentation and patient guidebook. To enhance the learning environment the use of posters, models and videos may be added. Patients will learn how the structural parts of their bodies function and how these functions impact their daily activities, movement, and exercise. Patients will learn how to promote a neutral spine, learn how to manage pain, and identify ways to improve their physical movement in order to promote healthy living. The purpose of this lesson is to expose patients to common physical limitations that impact physical activity. Participants will learn practical ways to adapt and manage aches and pains, and to minimize their effect on regular exercise. Patients will learn how to maintain good posture while sitting, walking, and lifting.  Balance Training  and Fall Prevention  Clinical staff led group instruction and group discussion with PowerPoint presentation and patient guidebook. To enhance the learning environment the use of posters, models and videos may be added. At the conclusion of this workshop, patients will understand the importance of their sensorimotor skills (vision, proprioception, and the vestibular system) in maintaining their ability to balance as they age. Patients will apply a variety of balancing exercises that are appropriate for their current level of function. Patients will understand the common causes for poor balance, possible solutions to these problems, and ways to modify their physical environment in order to minimize their fall risk. The purpose of this lesson is to teach patients about the importance of maintaining balance as they age and ways to minimize their risk of falling.  WORKSHOPS   Nutrition:  Fueling a Ship broker led group instruction and group discussion with PowerPoint presentation and patient guidebook. To enhance the learning environment the use of posters, models and videos may be added. Patients will review the foundational principles  of the Pritikin Eating Plan and understand what constitutes a serving size in each of the food groups. Patients will also learn Pritikin-friendly foods that are better choices when away from home and review make-ahead meal and snack options. Calorie density will be reviewed and applied to three nutrition priorities: weight maintenance, weight loss, and weight gain. The purpose of this lesson is to reinforce (in a group setting) the key concepts around what patients are recommended to eat and how to apply these guidelines when away from home by planning and selecting Pritikin-friendly options. Patients will understand how calorie density may be adjusted for different weight management goals.  Mindful Eating  Clinical staff led group instruction and group  discussion with PowerPoint presentation and patient guidebook. To enhance the learning environment the use of posters, models and videos may be added. Patients will briefly review the concepts of the Pritikin Eating Plan and the importance of low-calorie dense foods. The concept of mindful eating will be introduced as well as the importance of paying attention to internal hunger signals. Triggers for non-hunger eating and techniques for dealing with triggers will be explored. The purpose of this lesson is to provide patients with the opportunity to review the basic principles of the Pritikin Eating Plan, discuss the value of eating mindfully and how to measure internal cues of hunger and fullness using the Hunger Scale. Patients will also discuss reasons for non-hunger eating and learn strategies to use for controlling emotional eating.  Targeting Your Nutrition Priorities Clinical staff led group instruction and group discussion with PowerPoint presentation and patient guidebook. To enhance the learning environment the use of posters, models and videos may be added. Patients will learn how to determine their genetic susceptibility to disease by reviewing their family history. Patients will gain insight into the importance of diet as part of an overall healthy lifestyle in mitigating the impact of genetics and other environmental insults. The purpose of this lesson is to provide patients with the opportunity to assess their personal nutrition priorities by looking at their family history, their own health history and current risk factors. Patients will also be able to discuss ways of prioritizing and modifying the Pritikin Eating Plan for their highest risk areas  Menu  Clinical staff led group instruction and group discussion with PowerPoint presentation and patient guidebook. To enhance the learning environment the use of posters, models and videos may be added. Using menus brought in from [employer],  or printed from [employer], patients will apply the Pritikin dining out guidelines that were presented in the [employer] video. Patients will also be able to practice these guidelines in a variety of provided scenarios. The purpose of this lesson is to provide patients with the opportunity to practice hands-on learning of the Pritikin Dining Out guidelines with actual menus and practice scenarios.  Label Reading Clinical staff led group instruction and group discussion with PowerPoint presentation and patient guidebook. To enhance the learning environment the use of posters, models and videos may be added. Patients will review and discuss the Pritikin label reading guidelines presented in Pritikin's Label Reading Educational series video. Using fool labels brought in from local grocery stores and markets, patients will apply the label reading guidelines and determine if the packaged food meet the Pritikin guidelines. The purpose of this lesson is to provide patients with the opportunity to review, discuss, and practice hands-on learning of the Pritikin Label Reading guidelines with actual packaged food labels. Cooking School  Pritikin's  Cooking School Workshops are designed to teach patients ways to prepare quick, simple, and affordable recipes at home. The importance of nutrition's role in chronic disease risk reduction is reflected in its emphasis in the overall Pritikin program. By learning how to prepare essential core Pritikin Eating Plan recipes, patients will increase control over what they eat; be able to customize the flavor of foods without the use of added salt, sugar, or fat; and improve the quality of the food they consume. By learning a set of core recipes which are easily assembled, quickly prepared, and affordable, patients are more likely to prepare more healthy foods at home. These workshops focus on convenient breakfasts, simple entres, side dishes, and desserts which  can be prepared with minimal effort and are consistent with nutrition recommendations for cardiovascular risk reduction. Cooking [employer] are taught by a Armed forces logistics/support/administrative officer (RD) who has been trained by the [employer]. The chef or RD has a clear understanding of the importance of minimizing - if not completely eliminating - added fat, sugar, and sodium in recipes. Throughout the series of Cooking School Workshop sessions, patients will learn about healthy ingredients and efficient methods of cooking to build confidence in their capability to prepare    Cooking School weekly topics:  Adding Flavor- Sodium-Free  Fast and Healthy Breakfasts  Powerhouse Plant-Based Proteins  Satisfying Salads and Dressings  Simple Sides and Sauces  International Cuisine-Spotlight on the [employer] Zones  Delicious Desserts  Savory Soups  [employer] - Meals in a Astronomer Appetizers and Snacks  Comforting Weekend Breakfasts  One-Pot Wonders   Fast Evening Meals  Landscape architect Your Pritikin Plate  WORKSHOPS   Healthy Mindset (Psychosocial):  Focused Goals, Sustainable Changes Clinical staff led group instruction and group discussion with PowerPoint presentation and patient guidebook. To enhance the learning environment the use of posters, models and videos may be added. Patients will be able to apply effective goal setting strategies to establish at least one personal goal, and then take consistent, meaningful action toward that goal. They will learn to identify common barriers to achieving personal goals and develop strategies to overcome them. Patients will also gain an understanding of how our mind-set can impact our ability to achieve goals and the importance of cultivating a positive and growth-oriented mind-set. The purpose of this lesson is to provide patients with a deeper understanding of how to set and achieve personal goals, as well as the tools  and strategies needed to overcome common obstacles which may arise along the way.  From Head to Heart: The Power of a Healthy Outlook  Clinical staff led group instruction and group discussion with PowerPoint presentation and patient guidebook. To enhance the learning environment the use of posters, models and videos may be added. Patients will be able to recognize and describe the impact of emotions and mood on physical health. They will discover the importance of self-care and explore self-care practices which may work for them. Patients will also learn how to utilize the 4 C's to cultivate a healthier outlook and better manage stress and challenges. The purpose of this lesson is to demonstrate to patients how a healthy outlook is an essential part of maintaining good health, especially as they continue their cardiac rehab journey.  Healthy Sleep for a Healthy Heart Clinical staff led group instruction and group discussion with PowerPoint presentation and patient guidebook. To enhance the learning environment the use of posters, models and videos may  be added. At the conclusion of this workshop, patients will be able to demonstrate knowledge of the importance of sleep to overall health, well-being, and quality of life. They will understand the symptoms of, and treatments for, common sleep disorders. Patients will also be able to identify daytime and nighttime behaviors which impact sleep, and they will be able to apply these tools to help manage sleep-related challenges. The purpose of this lesson is to provide patients with a general overview of sleep and outline the importance of quality sleep. Patients will learn about a few of the most common sleep disorders. Patients will also be introduced to the concept of "sleep hygiene," and discover ways to self-manage certain sleeping problems through simple daily behavior changes. Finally, the workshop will motivate patients by clarifying the links between  quality sleep and their goals of heart-healthy living.   Recognizing and Reducing Stress Clinical staff led group instruction and group discussion with PowerPoint presentation and patient guidebook. To enhance the learning environment the use of posters, models and videos may be added. At the conclusion of this workshop, patients will be able to understand the types of stress reactions, differentiate between acute and chronic stress, and recognize the impact that chronic stress has on their health. They will also be able to apply different coping mechanisms, such as reframing negative self-talk. Patients will have the opportunity to practice a variety of stress management techniques, such as deep abdominal breathing, progressive muscle relaxation, and/or guided imagery.  The purpose of this lesson is to educate patients on the role of stress in their lives and to provide healthy techniques for coping with it.  Learning Barriers/Preferences:  Learning Barriers/Preferences - 01/26/24 1332       Learning Barriers/Preferences   Learning Barriers Hearing;Exercise Concerns   right ear deaf   Learning Preferences Written Material;Skilled Demonstration;Pictoral;Verbal Instruction;Individual Instruction;Group Instruction;Audio          Education Topics:  Knowledge Questionnaire Score:  Knowledge Questionnaire Score - 01/26/24 1333       Knowledge Questionnaire Score   Pre Score 21/24          Core Components/Risk Factors/Patient Goals at Admission:  Personal Goals and Risk Factors at Admission - 01/26/24 1335       Core Components/Risk Factors/Patient Goals on Admission    Weight Management Yes;Obesity    Intervention Weight Management: Develop a combined nutrition and exercise program designed to reach desired caloric intake, while maintaining appropriate intake of nutrient and fiber, sodium and fats, and appropriate energy expenditure required for the weight goal.;Weight Management:  Provide education and appropriate resources to help participant work on and attain dietary goals.;Weight Management/Obesity: Establish reasonable short term and long term weight goals.;Obesity: Provide education and appropriate resources to help participant work on and attain dietary goals.    Admit Weight 257 lb 11.5 oz (116.9 kg)    Expected Outcomes Long Term: Adherence to nutrition and physical activity/exercise program aimed toward attainment of established weight goal;Short Term: Continue to assess and modify interventions until short term weight is achieved;Weight Loss: Understanding of general recommendations for a balanced deficit meal plan, which promotes 1-2 lb weight loss per week and includes a negative energy balance of [phone] kcal/d;Understanding recommendations for meals to include 15-35% energy as protein, 25-35% energy from fat, 35-60% energy from carbohydrates, less than 200mg  of dietary cholesterol, 20-35 gm of total fiber daily;Understanding of distribution of calorie intake throughout the day with the consumption of 4-5 meals/snacks    Diabetes Yes  Intervention Provide education about signs/symptoms and action to take for hypo/hyperglycemia.;Provide education about proper nutrition, including hydration, and aerobic/resistive exercise prescription along with prescribed medications to achieve blood glucose in normal ranges: Fasting glucose 65-99 mg/dL    Expected Outcomes Short Term: Participant verbalizes understanding of the signs/symptoms and immediate care of hyper/hypoglycemia, proper foot care and importance of medication, aerobic/resistive exercise and nutrition plan for blood glucose control.;Long Term: Attainment of HbA1C < 7%.    Heart Failure Yes    Intervention Provide a combined exercise and nutrition program that is supplemented with education, support and counseling about heart failure. Directed toward relieving symptoms such as shortness of breath, decreased exercise  tolerance, and extremity edema.    Expected Outcomes Improve functional capacity of life;Short term: Attendance in program 2-3 days a week with increased exercise capacity. Reported lower sodium intake. Reported increased fruit and vegetable intake. Reports medication compliance.;Short term: Daily weights obtained and reported for increase. Utilizing diuretic protocols set by physician.;Long term: Adoption of self-care skills and reduction of barriers for early signs and symptoms recognition and intervention leading to self-care maintenance.    Hypertension Yes    Intervention Provide education on lifestyle modifcations including regular physical activity/exercise, weight management, moderate sodium restriction and increased consumption of fresh fruit, vegetables, and low fat dairy, alcohol moderation, and smoking cessation.;Monitor prescription use compliance.    Expected Outcomes Long Term: Maintenance of blood pressure at goal levels.;Short Term: Continued assessment and intervention until BP is < 140/70mm HG in hypertensive participants. < 130/20mm HG in hypertensive participants with diabetes, heart failure or chronic kidney disease.    Lipids Yes    Intervention Provide education and support for participant on nutrition & aerobic/resistive exercise along with prescribed medications to achieve LDL 70mg , HDL >40mg .    Expected Outcomes Short Term: Participant states understanding of desired cholesterol values and is compliant with medications prescribed. Participant is following exercise prescription and nutrition guidelines.;Long Term: Cholesterol controlled with medications as prescribed, with individualized exercise RX and with personalized nutrition plan. Value goals: LDL < 70mg , HDL > 40 mg.    Stress Yes    Intervention Offer individual and/or small group education and counseling on adjustment to heart disease, stress management and health-related lifestyle change. Teach and support self-help  strategies.;Refer participants experiencing significant psychosocial distress to appropriate mental health specialists for further evaluation and treatment. When possible, include family members and significant others in education/counseling sessions.    Expected Outcomes Short Term: Participant demonstrates changes in health-related behavior, relaxation and other stress management skills, ability to obtain effective social support, and compliance with psychotropic medications if prescribed.;Long Term: Emotional wellbeing is indicated by absence of clinically significant psychosocial distress or social isolation.    Personal Goal Other Yes    Personal Goal ST: Do more ADL's LT: increase heart funtion, endurance    Intervention Will continue to monitor pt and progress workloads as tolerated    Expected Outcomes Pt will achieve his goals          Core Components/Risk Factors/Patient Goals Review:    Core Components/Risk Factors/Patient Goals at Discharge (Final Review):    ITP Comments:  ITP Comments     Row Name 01/26/24 1124           ITP Comments Dr. [name] medical director. Introduction to [facility] education program/intensive cardiac rehab. Initial orientation packet reviewed with patient          Comments: Participant attended orientation for the cardiac rehabilitation program on  01/26/2024  to perform initial intake and exercise walk test. Patient introduced to the [facility] education and orientation packet was reviewed. Completed 6-minute walk test, measurements, initial ITP, and exercise prescription. Vital signs stable. Telemetry-AV paced with T-wave inversion, asymptomatic.   Service time was from 0908 to 1117.

## 2024-01-30 ENCOUNTER — Encounter (HOSPITAL_COMMUNITY)
Admission: RE | Admit: 2024-01-30 | Discharge: 2024-01-30 | Disposition: A | Discharge status: Home / Self Care | Source: Ambulatory Visit | Attending: Cardiology | Admitting: Cardiology

## 2024-01-30 DIAGNOSIS — I5022 Chronic systolic (congestive) heart failure: Secondary | ICD-10-CM

## 2024-01-30 LAB — GLUCOSE, CAPILLARY
Glucose-Capillary: 179 mg/dL — ABNORMAL HIGH (ref 70–99)
Glucose-Capillary: 157 mg/dL — ABNORMAL HIGH (ref 70–99)

## 2024-01-30 NOTE — Progress Notes (Addendum)
 Daily Session Note  Patient Details  Name: Adam Ponce MRN: [id number] Date of Birth: [birth year] Referring Provider:   Flowsheet Row INTENSIVE CARDIAC REHAB ORIENT from 01/26/2024 in [facility]  Referring Provider Dr. Burley MD (Dr. [name] MD covering)    Encounter Date: 01/30/2024  Check In:  Session Check In - 01/30/24 1305       Check-In   Supervising physician immediately available to respond to emergencies [facility] MD immediately available    Physician(s) Orren Fabry, PA-C    Location [facility]    Staff Present Hadassah Quan, RN, Cathyann Levin, RN, BSN;Johnny Porter, MS, Exercise Physiologist;David Janann, MS, ACSM-CEP, CCRP, Exercise Physiologist;Olinty Valere, MS, ACSM-CEP, Exercise Physiologist;Kealani Leckey Lennon, RN, BSN    Virtual Visit No    Medication changes reported     No    Fall or balance concerns reported    No    Tobacco Cessation No Change    Warm-up and Cool-down Performed as group-led instruction    Resistance Training Performed Yes    VAD Patient? No    PAD/SET Patient? No      Pain Assessment   Currently in Pain? No/denies    Pain Score 0-No pain    Multiple Pain Sites No          Capillary Blood Glucose: Results for orders placed or performed during the hospital encounter of 01/30/24 (from the past 24 hours)  Glucose, capillary     Status: Abnormal   Collection Time: 01/30/24 12:51 PM  Result Value Ref Range   Glucose-Capillary 179 (H) 70 - 99 mg/dL  Glucose, capillary     Status: Abnormal   Collection Time: 01/30/24  1:55 PM  Result Value Ref Range   Glucose-Capillary 157 (H) 70 - 99 mg/dL     Exercise Prescription Changes - 01/30/24 1600       Response to Exercise   Blood Pressure (Admit) 110/64    Blood Pressure (Exercise) 104/70    Blood Pressure (Exit) 112/64    Heart Rate (Admit) 64 bpm    Heart Rate (Exercise) 71 bpm    Heart Rate (Exit) 64 bpm     Rating of Perceived Exertion (Exercise) 10    Symptoms None    Comments Pt's first day in the [facility] program    Duration Continue with 30 min of aerobic exercise without signs/symptoms of physical distress.    Intensity THRR unchanged      Progression   Progression Continue to progress workloads to maintain intensity without signs/symptoms of physical distress.    Average METs 1.7      Resistance Training   Training Prescription Yes    Weight 2 lb wts    Reps 10-15    Time 5 Minutes      Interval Training   Interval Training No      NuStep   Level 1    SPM 43    Minutes 20    METs 1.7          Social History   Tobacco Use  Smoking Status Former   Current packs/day: 1.00   Types: Cigarettes  Smokeless Tobacco Never    Goals Met:  Exercise tolerated well No report of concerns or symptoms today Strength training completed today  Goals Unmet:  Not Applicable  Comments: Pt started cardiac rehab today.  Pt tolerated light exercise without difficulty. VSS, telemetry-AV paced, asymptomatic.  Medication list  reconciled. Pt denies barriers to medication compliance.  PSYCHOSOCIAL ASSESSMENT:  PHQ-11. Pt exhibits positive coping skills, hopeful outlook with supportive family. No psychosocial needs identified at this time, no psychosocial interventions necessary.    Pt enjoys cooking and watching TV.   Pt oriented to exercise equipment and routine.    Understanding verbalized.     Dr. [name] is Medical Director for Cardiac Rehab at [facility].

## 2024-01-31 ENCOUNTER — Ambulatory Visit: Payer: Self-pay | Admitting: Cardiology

## 2024-02-01 ENCOUNTER — Encounter (HOSPITAL_COMMUNITY)
Admission: RE | Admit: 2024-02-01 | Discharge: 2024-02-01 | Disposition: A | Discharge status: Home / Self Care | Source: Ambulatory Visit | Attending: Cardiology | Admitting: Cardiology

## 2024-02-01 DIAGNOSIS — I5022 Chronic systolic (congestive) heart failure: Secondary | ICD-10-CM | POA: Diagnosis not present

## 2024-02-01 LAB — GLUCOSE, CAPILLARY
Glucose-Capillary: 176 mg/dL — ABNORMAL HIGH (ref 70–99)
Glucose-Capillary: 134 mg/dL — ABNORMAL HIGH (ref 70–99)

## 2024-02-02 ENCOUNTER — Ambulatory Visit: Payer: Self-pay | Admitting: Cardiology

## 2024-02-03 ENCOUNTER — Encounter (HOSPITAL_COMMUNITY)
Admission: RE | Admit: 2024-02-03 | Discharge: 2024-02-03 | Disposition: A | Source: Ambulatory Visit | Attending: Cardiology

## 2024-02-03 DIAGNOSIS — I5022 Chronic systolic (congestive) heart failure: Secondary | ICD-10-CM | POA: Diagnosis not present

## 2024-02-06 ENCOUNTER — Encounter (HOSPITAL_COMMUNITY)
Admission: RE | Admit: 2024-02-06 | Discharge: 2024-02-06 | Disposition: A | Source: Ambulatory Visit | Attending: Cardiology

## 2024-02-06 DIAGNOSIS — I5022 Chronic systolic (congestive) heart failure: Secondary | ICD-10-CM

## 2024-02-08 ENCOUNTER — Ambulatory Visit: Payer: Self-pay | Admitting: Cardiology

## 2024-02-08 ENCOUNTER — Encounter (HOSPITAL_COMMUNITY)
Admission: RE | Admit: 2024-02-08 | Discharge: 2024-02-08 | Disposition: A | Source: Ambulatory Visit | Attending: Cardiology

## 2024-02-08 DIAGNOSIS — I5022 Chronic systolic (congestive) heart failure: Secondary | ICD-10-CM

## 2024-02-08 LAB — GLUCOSE, CAPILLARY
Glucose-Capillary: 198 mg/dL — ABNORMAL HIGH (ref 70–99)
Glucose-Capillary: 113 mg/dL — ABNORMAL HIGH (ref 70–99)

## 2024-02-10 ENCOUNTER — Encounter (HOSPITAL_COMMUNITY)
Admission: RE | Admit: 2024-02-10 | Discharge: 2024-02-10 | Disposition: A | Discharge status: Home / Self Care | Source: Ambulatory Visit | Attending: Cardiology | Admitting: Cardiology

## 2024-02-10 DIAGNOSIS — I5022 Chronic systolic (congestive) heart failure: Secondary | ICD-10-CM | POA: Diagnosis not present

## 2024-02-13 ENCOUNTER — Encounter (HOSPITAL_COMMUNITY)
Admission: RE | Admit: 2024-02-13 | Discharge: 2024-02-13 | Disposition: A | Discharge status: Home / Self Care | Source: Ambulatory Visit | Attending: Cardiology | Admitting: Cardiology

## 2024-02-13 DIAGNOSIS — I5022 Chronic systolic (congestive) heart failure: Secondary | ICD-10-CM | POA: Diagnosis not present

## 2024-02-15 ENCOUNTER — Encounter (HOSPITAL_COMMUNITY)
Admission: RE | Admit: 2024-02-15 | Discharge: 2024-02-15 | Disposition: A | Discharge status: Home / Self Care | Source: Ambulatory Visit | Attending: Cardiology | Admitting: Cardiology

## 2024-02-15 DIAGNOSIS — I5022 Chronic systolic (congestive) heart failure: Secondary | ICD-10-CM | POA: Diagnosis not present

## 2024-02-17 ENCOUNTER — Encounter (HOSPITAL_COMMUNITY)
Admission: RE | Admit: 2024-02-17 | Discharge: 2024-02-17 | Disposition: A | Discharge status: Home / Self Care | Source: Ambulatory Visit | Attending: Cardiology | Admitting: Cardiology

## 2024-02-20 ENCOUNTER — Telehealth (HOSPITAL_COMMUNITY): Payer: Self-pay

## 2024-02-20 ENCOUNTER — Encounter (HOSPITAL_COMMUNITY): Admission: RE | Admit: 2024-02-20 | Source: Ambulatory Visit

## 2024-02-20 NOTE — Telephone Encounter (Signed)
 Patient's sister called stating patient is currently at the [facility] in [place] and will not be in for class today. Her number is [phone] if we need to reach her.

## 2024-02-21 ENCOUNTER — Encounter (HOSPITAL_COMMUNITY): Payer: Self-pay

## 2024-02-21 ENCOUNTER — Telehealth (HOSPITAL_COMMUNITY): Payer: Self-pay

## 2024-02-21 NOTE — Telephone Encounter (Signed)
 Patient c/o for 11:45 CR class on 11/05, states [facility].

## 2024-02-21 NOTE — Progress Notes (Signed)
 Cardiac Individual Treatment Plan  Patient Details  Name: Adam Ponce MRN: [id number] Date of Birth: Sep 29, 1950 Referring Provider:   Flowsheet Row INTENSIVE CARDIAC REHAB ORIENT from 01/26/2024 in [facility]  Referring Provider Dr. Burley MD (Dr. [name] MD covering)    Initial Encounter Date:  Flowsheet Row INTENSIVE CARDIAC REHAB ORIENT from 01/26/2024 in [facility]  Date 01/26/24    Visit Diagnosis: No diagnosis found.  Patient's Home Medications on Admission:  Current Outpatient Medications:    acetaminophen  (TYLENOL ) 500 MG tablet, Take 1 tablet (500 mg total) by mouth 4 (four) times daily., Disp: , Rfl:    albuterol  (PROVENTIL  HFA;VENTOLIN  HFA) 108 (90 Base) MCG/ACT inhaler, Inhale 2 puffs into the lungs every 6 (six) hours as needed for wheezing or shortness of breath., Disp: , Rfl:    aluminum-magnesium hydroxide-simethicone  (MAALOX) 200-200-20 MG/5ML SUSP, Take 30 mLs by mouth 2 (two) times daily as needed (as per [place] med list)., Disp: , Rfl:    amLODipine  (NORVASC ) 5 MG tablet, Take 1 tablet (5 mg total) by mouth daily., Disp: 90 tablet, Rfl: 3   Azelastine  HCl 137 MCG/SPRAY SOLN, Place 2 sprays into the nose daily. Astepro , Disp: , Rfl:    bacitracin 500 UNIT/GM ointment, Apply 1 Application topically 2 (two) times daily. Small amount, Disp: , Rfl:    betamethasone valerate (VALISONE) 0.1 % cream, Apply topically 2 (two) times daily., Disp: , Rfl:    calcium carbonate (TUMS - DOSED IN MG ELEMENTAL CALCIUM) 500 MG chewable tablet, Chew 2 tablets by mouth 3 (three) times daily as needed for indigestion or heartburn (per [facility])., Disp: , Rfl:    Cholecalciferol (VITAMIN D3) 25 MCG (1000 UT) CAPS, Take 2,000 Units by mouth daily., Disp: , Rfl:    cyanocobalamin (VITAMIN B12) 1000 MCG/ML injection, Inject 1,000 mcg into the muscle every 30 (thirty) days.,  Disp: , Rfl:    darunavir -cobicistat  (PREZCOBIX ) 800-150 MG tablet, Take 1 tablet by mouth daily with breakfast., Disp: , Rfl:    diclofenac Sodium (VOLTAREN) 1 % GEL, Apply 2 g topically daily as needed (pain)., Disp: , Rfl:    empagliflozin (JARDIANCE) 25 MG TABS tablet, Take 12.5 mg by mouth daily., Disp: , Rfl:    emtricitabine -tenofovir  AF (DESCOVY ) 200-25 MG tablet, Take 1 tablet by mouth daily., Disp: , Rfl:    famotidine (PEPCID) 20 MG tablet, Take 10 mg by mouth 2 (two) times daily., Disp: , Rfl:    fenofibrate (TRICOR) 145 MG tablet, Take 145 mg by mouth daily., Disp: , Rfl:    hydrocerin (EUCERIN) CREA, Apply 1 Application topically daily as needed (Dry skin)., Disp: , Rfl:    insulin  glargine (LANTUS ) 100 UNIT/ML Solostar Pen, Inject 22 Units into the skin at bedtime., Disp: , Rfl:    metFORMIN (GLUCOPHAGE) 500 MG tablet, Take 500 mg by mouth 2 (two) times daily with a meal. (Patient not taking: Reported on 01/30/2024), Disp: , Rfl:    pantoprazole  (PROTONIX ) 40 MG tablet, Take 1 tablet (40 mg total) by mouth 2 (two) times daily for 14 days., Disp: , Rfl:    pravastatin (PRAVACHOL) 80 MG tablet, Take 40 mg by mouth daily., Disp: , Rfl:    pregabalin  (LYRICA ) 100 MG capsule, Take 1 capsule (100 mg total) by mouth 2 (two) times daily. (Patient taking differently: Take 50 mg by mouth 2 (two) times daily.), Disp:  6 capsule, Rfl: 0   Semaglutide, 2 MG/DOSE, 8 MG/3ML SOPN, Inject 1 mg into the skin once a week. Inject on Thursday, Disp: , Rfl:    sennosides-docusate sodium  (SENOKOT-S) 8.6-50 MG tablet, Take 2 tablets by mouth daily., Disp: , Rfl:    sodium chloride  (OCEAN) 0.65 % nasal spray, Place 2 sprays into the nose daily as needed for congestion., Disp: , Rfl:    terazosin (HYTRIN) 2 MG capsule, Take 1 capsule by mouth at bedtime. 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   Hypertension    Presence of permanent cardiac pacemaker     Tobacco Use: Social History   Tobacco Use  Smoking Status Former   Current packs/day: 1.00   Types: Cigarettes  Smokeless Tobacco Never    Labs: Review Flowsheet       Latest Ref Rng & Units 04/15/2023  Labs for ITP Cardiac and Pulmonary Rehab  Hemoglobin A1c 4.8 - 5.6 % 8.6     Capillary Blood Glucose: Lab Results  Component Value Date   GLUCAP 113 (H) 02/08/2024   GLUCAP 198 (H) 02/08/2024   GLUCAP 134 (H) 02/01/2024   GLUCAP 176 (H) 02/01/2024   GLUCAP 157 (H) 01/30/2024     Exercise Target Goals: Exercise Program Goal: Individual exercise prescription set using results from initial 6 min walk test and THRR while considering  patient's activity barriers and safety.   Exercise Prescription Goal: Initial exercise prescription builds to 30-45 minutes a day of aerobic activity, 2-3 days per week.  Home exercise guidelines will be given to patient during program as part of exercise prescription that the participant will acknowledge.  Activity Barriers & Risk Stratification:  Activity Barriers & Cardiac Risk Stratification - 01/26/24 1133       Activity Barriers & Cardiac Risk Stratification   Activity Barriers Joint Problems;Assistive Device;Arthritis;Deconditioning;Muscular Weakness;Left Hip Replacement;Right Hip Replacement;Decreased Ventricular Function;Balance Concerns;History of Falls    Cardiac Risk Stratification High          6 Minute Walk:  6 Minute Walk     Row Name 01/26/24 1043         6 Minute Walk   Phase Initial  NUSTEP test     Distance 853  feet     Walk Time 6 minutes     # of Rest Breaks 3     MPH 1.61     METS 1.16     RPE 11     Perceived Dyspnea  0     VO2 Peak 4.07     Symptoms Yes (comment)     Comments Pt has chronic pain: bilateral knee, foot pain, right wrist pain. No pain resolves completely . x3 rest break on Nustep due to left knee pain 10/10. Total steps 295, 0.26km, AVG 6 watts     Resting HR 64 bpm     Resting BP 124/70     Resting Oxygen Saturation  97 %  Exercise Oxygen Saturation  during 6 min walk 95 %     Max Ex. HR 73 bpm     Max Ex. BP 122/78     2 Minute Post BP 124/78        Oxygen Initial Assessment:   Oxygen Re-Evaluation:   Oxygen Discharge (Final Oxygen Re-Evaluation):   Initial Exercise Prescription:  Initial Exercise Prescription - 01/26/24 1100       Date of Initial Exercise RX and Referring Provider   Date 01/26/24    Referring Provider Dr. Burley MD (Dr. [name] MD covering)    Expected Discharge Date 04/18/24      NuStep   Level 1    SPM 55    Minutes 20    METs 1.3      Prescription Details   Frequency (times per week) 3    Duration Progress to 30 minutes of continuous aerobic without signs/symptoms of physical distress      Intensity   THRR 40-80% of Max Heartrate 59-118    Ratings of Perceived Exertion 11-13    Perceived Dyspnea 0-4      Progression   Progression Continue to progress workloads to maintain intensity without signs/symptoms of physical distress.      Resistance Training   Training Prescription Yes    Weight 2    Reps 10-15          Perform Capillary Blood Glucose checks as needed.  Exercise Prescription Changes:   Exercise Prescription Changes     Row Name 01/30/24 1600 02/08/24 1500           Response to Exercise   Blood Pressure (Admit) 110/64 116/58      Blood Pressure (Exercise) 104/70 132/56      Blood Pressure (Exit) 112/64 104/50      Heart Rate (Admit) 64 bpm 63 bpm      Heart Rate (Exercise) 71 bpm 79 bpm       Heart Rate (Exit) 64 bpm 65 bpm      Rating of Perceived Exertion (Exercise) 10 11      Symptoms None None      Comments Pt's first day in the CRP2 program Reviewed METs      Duration Continue with 30 min of aerobic exercise without signs/symptoms of physical distress. Continue with 30 min of aerobic exercise without signs/symptoms of physical distress.      Intensity THRR unchanged THRR unchanged        Progression   Progression Continue to progress workloads to maintain intensity without signs/symptoms of physical distress. Continue to progress workloads to maintain intensity without signs/symptoms of physical distress.      Average METs 1.7 1.9        Resistance Training   Training Prescription Yes No      Weight 2 lb wts No wts on Wednesdays      Reps 10-15 --      Time 5 Minutes --        Interval Training   Interval Training No No        NuStep   Level 1 1      SPM 43 70      Minutes 20 25      METs 1.7 1.9         Exercise Comments:   Exercise Comments     Row Name 01/30/24 1624 02/08/24 1500         Exercise Comments Pt's first day in the  CRP2 program. Pt tolerated session well and had no complaints. Reviewed METs with patient. Pt is making slow progress. Pt has been able to increase his METs and time. Pt is still not at goal of 30 minutes on Nsutep, but is working toward that.         Exercise Goals and Review:   Exercise Goals     Row Name 01/26/24 1136             Exercise Goals   Increase Physical Activity Yes       Intervention Provide advice, education, support and counseling about physical activity/exercise needs.;Develop an individualized exercise prescription for aerobic and resistive training based on initial evaluation findings, risk stratification, comorbidities and participant's personal goals.       Expected Outcomes Short Term: Attend rehab on a regular basis to increase amount of physical activity.;Long Term: Add in home exercise to  make exercise part of routine and to increase amount of physical activity.;Long Term: Exercising regularly at least 3-5 days a week.       Increase Strength and Stamina Yes       Intervention Provide advice, education, support and counseling about physical activity/exercise needs.;Develop an individualized exercise prescription for aerobic and resistive training based on initial evaluation findings, risk stratification, comorbidities and participant's personal goals.       Expected Outcomes Short Term: Increase workloads from initial exercise prescription for resistance, speed, and METs.;Short Term: Perform resistance training exercises routinely during rehab and add in resistance training at home;Long Term: Improve cardiorespiratory fitness, muscular endurance and strength as measured by increased METs and functional capacity ( )       Able to understand and use rate of perceived exertion (RPE) scale Yes       Intervention Provide education and explanation on how to use RPE scale       Expected Outcomes Short Term: Able to use RPE daily in rehab to express subjective intensity level;Long Term:  Able to use RPE to guide intensity level when exercising independently       Knowledge and understanding of Target Heart Rate Range (THRR) Yes       Intervention Provide education and explanation of THRR including how the numbers were predicted and where they are located for reference       Expected Outcomes Short Term: Able to state/look up THRR;Long Term: Able to use THRR to govern intensity when exercising independently;Short Term: Able to use daily as guideline for intensity in rehab       Understanding of Exercise Prescription Yes       Intervention Provide education, explanation, and written materials on patient's individual exercise prescription       Expected Outcomes Short Term: Able to explain program exercise prescription;Long Term: Able to explain home exercise prescription to exercise  independently          Exercise Goals Re-Evaluation :  Exercise Goals Re-Evaluation     Row Name 01/30/24 1622             Exercise Goal Re-Evaluation   Exercise Goals Review Increase Physical Activity;Increase Strength and Stamina;Able to understand and use rate of perceived exertion (RPE) scale;Knowledge and understanding of Target Heart Rate Range (THRR);Understanding of Exercise Prescription       Comments Pt's first day in the CRP2 program. Pt understands the exercise rx, RPE sclae and THRR.       Expected Outcomes Will continue to monitor the patient and progress exercise workloads as tolerated.  Discharge Exercise Prescription (Final Exercise Prescription Changes):  Exercise Prescription Changes - 02/08/24 1500       Response to Exercise   Blood Pressure (Admit) 116/58    Blood Pressure (Exercise) 132/56    Blood Pressure (Exit) 104/50    Heart Rate (Admit) 63 bpm    Heart Rate (Exercise) 79 bpm    Heart Rate (Exit) 65 bpm    Rating of Perceived Exertion (Exercise) 11    Symptoms None    Comments Reviewed METs    Duration Continue with 30 min of aerobic exercise without signs/symptoms of physical distress.    Intensity THRR unchanged      Progression   Progression Continue to progress workloads to maintain intensity without signs/symptoms of physical distress.    Average METs 1.9      Resistance Training   Training Prescription No    Weight No wts on Wednesdays      Interval Training   Interval Training No      NuStep   Level 1    SPM 70    Minutes 25    METs 1.9          Nutrition:  Target Goals: Understanding of nutrition guidelines, daily intake of sodium 1500mg , cholesterol 200mg , calories 30% from fat and 7% or less from saturated fats, daily to have 5 or more servings of fruits and vegetables.  Biometrics:  Pre Biometrics - 01/26/24 1124       Pre Biometrics   Waist Circumference 48 inches    Hip Circumference 50.5 inches     Waist to Hip Ratio 0.95 %    Triceps Skinfold 26 mm    % Body Fat 36.5 %    Grip Strength 20 kg    Flexibility --   Did not attempt, chronic pain   Single Leg Stand --   Did not attempt, pt uses cane          Nutrition Therapy Plan and Nutrition Goals:   Nutrition Assessments:  MEDIFICTS Score Key: >=70 Need to make dietary changes  40-70 Heart Healthy Diet <= 40 Therapeutic Level Cholesterol Diet    Picture Your Plate Scores: <59 Unhealthy dietary pattern with much room for improvement. 41-50 Dietary pattern unlikely to meet recommendations for good health and room for improvement. 51-60 More healthful dietary pattern, with some room for improvement.  >60 Healthy dietary pattern, although there may be some specific behaviors that could be improved.    Nutrition Goals Re-Evaluation:   Nutrition Goals Re-Evaluation:   Nutrition Goals Discharge (Final Nutrition Goals Re-Evaluation):   Psychosocial: Target Goals: Acknowledge presence or absence of significant depression and/or stress, maximize coping skills, provide positive support system. Participant is able to verbalize types and ability to use techniques and skills needed for reducing stress and depression.  Initial Review & Psychosocial Screening:  Initial Psych Review & Screening - 01/26/24 1138       Initial Review   Current issues with Current Sleep Concerns;Current Stress Concerns    Source of Stress Concerns Family;Chronic Illness      Family Dynamics   Good Support System? Yes   He has a few friends, siblings, counciler with the [facility]     Barriers   Psychosocial barriers to participate in program There are no identifiable barriers or psychosocial needs.      Screening Interventions   Interventions To provide support and resources with identified psychosocial needs;Provide feedback about the scores to participant    Expected Outcomes  Long Term Goal: Stressors or current issues are controlled or  eliminated.;Short Term goal: Identification and review with participant of any Quality of Life or Depression concerns found by scoring the questionnaire.;Long Term goal: The participant improves quality of Life and PHQ9 Scores as seen by post scores and/or verbalization of changes          Quality of Life Scores:  Quality of Life - 01/26/24 1139       Quality of Life   Select Quality of Life      Quality of Life Scores   Health/Function Pre 19.23 %    Socioeconomic Pre 27.14 %    Psych/Spiritual Pre 28.07 %    Family Pre 23 %    GLOBAL Pre 23.24 %         Scores of 19 and below usually indicate a poorer quality of life in these areas.  A difference of  2-3 points is a clinically meaningful difference.  A difference of 2-3 points in the total score of the Quality of Life Index has been associated with significant improvement in overall quality of life, self-image, physical symptoms, and general health in studies assessing change in quality of life.  PHQ-9: Review Flowsheet       02/15/2024 01/26/2024 03/20/2020 01/10/2020  Depression screen PHQ 2/9  Decreased Interest 0 1 0 2  Down, Depressed, Hopeless 0 1 0 1  PHQ - 2 Score 0 2 0 3  Altered sleeping 1 2 - 1  Tired, decreased energy 1 1 0 1  Change in appetite 0 1 0 2  Feeling bad or failure about yourself  0 2 0 1  Trouble concentrating 0 2 0 0  Moving slowly or fidgety/restless 0 1 0 0  Suicidal thoughts 0 0 0 0  PHQ-9 Score 2 11 - 8  Difficult doing work/chores Somewhat difficult Not difficult at all Not difficult at all Somewhat difficult   Interpretation of Total Score  Total Score Depression Severity:  1-4 = Minimal depression, 5-9 = Mild depression, 10-14 = Moderate depression, 15-19 = Moderately severe depression, 20-27 = Severe depression   Psychosocial Evaluation and Intervention:   Psychosocial Re-Evaluation:  Psychosocial Re-Evaluation     Row Name 02/15/24 1655             Psychosocial  Re-Evaluation   Current issues with Current Sleep Concerns;Current Stress Concerns       Comments Upon reassessment, Davin' PHQ-9 decreased from an 11 to a 2, only citing sleep and energry level concerns at this time.       Expected Outcomes Nyzier will experience controlled/decreased psychosocial concerns/stressors by the completion of cardiac rehab.       Interventions Stress management education;Encouraged to attend Pulmonary Rehabilitation for the exercise       Continue Psychosocial Services  Follow up required by staff          Psychosocial Discharge (Final Psychosocial Re-Evaluation):  Psychosocial Re-Evaluation - 02/15/24 1655       Psychosocial Re-Evaluation   Current issues with Current Sleep Concerns;Current Stress Concerns    Comments Upon reassessment, Steed' PHQ-9 decreased from an 11 to a 2, only citing sleep and energry level concerns at this time.    Expected Outcomes Zachry will experience controlled/decreased psychosocial concerns/stressors by the completion of cardiac rehab.    Interventions Stress management education;Encouraged to attend Pulmonary Rehabilitation for the exercise    Continue Psychosocial Services  Follow up required by staff  Vocational Rehabilitation: Provide vocational rehab assistance to qualifying candidates.   Vocational Rehab Evaluation & Intervention:  Vocational Rehab - 01/26/24 1334       Initial Vocational Rehab Evaluation & Intervention   Assessment shows need for Vocational Rehabilitation No   Retired         Education: Education Goals: Education classes will be provided on a weekly basis, covering required topics. Participant will state understanding/return demonstration of topics presented.    Education     Row Name 01/30/24 1300     Education   Cardiac Education Topics Pritikin   Geographical Information Systems Officer Psychosocial   Psychosocial Workshop From Head to Heart:  The Power of a Healthy Outlook   Instruction Review Code 1- Verbalizes Understanding   Class Start Time 1150   Class Stop Time 1230   Class Time Calculation (min) 40 min    Row Name 02/01/24 1300     Education   Cardiac Education Topics Pritikin   Orthoptist   Educator Dietitian   Weekly Topic Tasty Appetizers and Snacks   Instruction Review Code 1- Verbalizes Understanding   Class Start Time 1145   Class Stop Time 1220   Class Time Calculation (min) 35 min    Row Name 02/03/24 1200     Education   Cardiac Education Topics Pritikin     Workshops   Educator Exercise Physiologist   Select Exercise   Exercise Workshop Managing Heart Disease: Your Path to a Healthier Heart   Instruction Review Code 1- Verbalizes Understanding   Class Start Time 1152   Class Stop Time 1233   Class Time Calculation (min) 41 min    Row Name 02/06/24 1100     Education   Cardiac Education Topics Pritikin   Nurse, Children's Exercise Physiologist   Select Psychosocial   Psychosocial Healthy Minds, Bodies, Hearts   Instruction Review Code 1- Verbalizes Understanding   Class Start Time 1145   Class Stop Time 1220   Class Time Calculation (min) 35 min    Row Name 02/08/24 1200     Education   Cardiac Education Topics Pritikin   Secondary School Teacher School   Educator Nurse;Respiratory Therapist   Weekly Topic Adding Flavor - Sodium-Free   Instruction Review Code 1- Verbalizes Understanding   Class Start Time 1145   Class Stop Time 1223   Class Time Calculation (min) 38 min    Row Name 02/10/24 1100     Education   Cardiac Education Topics Pritikin   Glass Blower/designer Nutrition   Nutrition Workshop Label Reading   Instruction Review Code 1- Verbalizes Understanding   Class Start Time 1145   Class Stop Time 1220   Class Time Calculation (min) 35 min    Row  Name 02/13/24 1200     Education   Cardiac Education Topics Pritikin   Select Workshops     Workshops   Educator Exercise Physiologist   Select Exercise   Exercise Workshop Location Manager and Fall Prevention   Instruction Review Code 1- Verbalizes Understanding   Class Start Time 1154   Class Stop Time 1243   Class Time Calculation (min) 49 min    Row Name 02/15/24 1300  Education   Cardiac Education Topics Pritikin   Customer Service Manager   Weekly Topic Fast and Healthy Breakfasts   Instruction Review Code 1- Verbalizes Understanding   Class Start Time 1145   Class Stop Time 1225   Class Time Calculation (min) 40 min      Core Videos: Exercise    Move It!  Clinical staff conducted group or individual video education with verbal and written material and guidebook.  Patient learns the recommended Pritikin exercise program. Exercise with the goal of living a long, healthy life. Some of the health benefits of exercise include controlled diabetes, healthier blood pressure levels, improved cholesterol levels, improved heart and lung capacity, improved sleep, and better body composition. Everyone should speak with their doctor before starting or changing an exercise routine.  Biomechanical Limitations Clinical staff conducted group or individual video education with verbal and written material and guidebook.  Patient learns how biomechanical limitations can impact exercise and how we can mitigate and possibly overcome limitations to have an impactful and balanced exercise routine.  Body Composition Clinical staff conducted group or individual video education with verbal and written material and guidebook.  Patient learns that body composition (ratio of muscle mass to fat mass) is a key component to assessing overall fitness, rather than body weight alone. Increased fat mass, especially visceral belly fat, can put us  at increased risk  for metabolic syndrome, type 2 diabetes, heart disease, and even death. It is recommended to combine diet and exercise (cardiovascular and resistance training) to improve your body composition. Seek guidance from your physician and exercise physiologist before implementing an exercise routine.  Exercise Action Plan Clinical staff conducted group or individual video education with verbal and written material and guidebook.  Patient learns the recommended strategies to achieve and enjoy long-term exercise adherence, including variety, self-motivation, self-efficacy, and positive decision making. Benefits of exercise include fitness, good health, weight management, more energy, better sleep, less stress, and overall well-being.  Medical   Heart Disease Risk Reduction Clinical staff conducted group or individual video education with verbal and written material and guidebook.  Patient learns our heart is our most vital organ as it circulates oxygen, nutrients, white blood cells, and hormones throughout the entire body, and carries waste away. Data supports a plant-based eating plan like the Pritikin Program for its effectiveness in slowing progression of and reversing heart disease. The video provides a number of recommendations to address heart disease.   Metabolic Syndrome and Belly Fat  Clinical staff conducted group or individual video education with verbal and written material and guidebook.  Patient learns what metabolic syndrome is, how it leads to heart disease, and how one can reverse it and keep it from coming back. You have metabolic syndrome if you have 3 of the following 5 criteria: abdominal obesity, high blood pressure, high triglycerides, low HDL cholesterol, and high blood sugar.  Hypertension and Heart Disease Clinical staff conducted group or individual video education with verbal and written material and guidebook.  Patient learns that high blood pressure, or hypertension, is very  common in the [place] . Hypertension is largely due to excessive salt intake, but other important risk factors include being overweight, physical inactivity, drinking too much alcohol, smoking, and not eating enough potassium from fruits and vegetables. High blood pressure is a leading risk factor for heart attack, stroke, congestive heart failure, dementia, kidney failure, and premature death. Long-term effects of excessive salt  intake include stiffening of the arteries and thickening of heart muscle and organ damage. Recommendations include ways to reduce hypertension and the risk of heart disease.  Diseases of Our Time - Focusing on Diabetes Clinical staff conducted group or individual video education with verbal and written material and guidebook.  Patient learns why the best way to stop diseases of our time is prevention, through food and other lifestyle changes. Medicine (such as prescription pills and surgeries) is often only a Band-Aid on the problem, not a long-term solution. Most common diseases of our time include obesity, type 2 diabetes, hypertension, heart disease, and cancer. The Pritikin Program is recommended and has been proven to help reduce, reverse, and/or prevent the damaging effects of metabolic syndrome.  Nutrition   Overview of the Pritikin Eating Plan  Clinical staff conducted group or individual video education with verbal and written material and guidebook.  Patient learns about the Pritikin Eating Plan for disease risk reduction. The Pritikin Eating Plan emphasizes a wide variety of unrefined, minimally-processed carbohydrates, like fruits, vegetables, whole grains, and legumes. Go, Caution, and Stop food choices are explained. Plant-based and lean animal proteins are emphasized. Rationale provided for low sodium intake for blood pressure control, low added sugars for blood sugar stabilization, and low added fats and oils for coronary artery disease risk reduction and  weight management.  Calorie Density  Clinical staff conducted group or individual video education with verbal and written material and guidebook.  Patient learns about calorie density and how it impacts the Pritikin Eating Plan. Knowing the characteristics of the food you choose will help you decide whether those foods will lead to weight gain or weight loss, and whether you want to consume more or less of them. Weight loss is usually a side effect of the Pritikin Eating Plan because of its focus on low calorie-dense foods.  Label Reading  Clinical staff conducted group or individual video education with verbal and written material and guidebook.  Patient learns about the Pritikin recommended label reading guidelines and corresponding recommendations regarding calorie density, added sugars, sodium content, and whole grains.  Dining Out - Part 1  Clinical staff conducted group or individual video education with verbal and written material and guidebook.  Patient learns that restaurant meals can be sabotaging because they can be so high in calories, fat, sodium, and/or sugar. Patient learns recommended strategies on how to positively address this and avoid unhealthy pitfalls.  Facts on Fats  Clinical staff conducted group or individual video education with verbal and written material and guidebook.  Patient learns that lifestyle modifications can be just as effective, if not more so, as many medications for lowering your risk of heart disease. A Pritikin lifestyle can help to reduce your risk of inflammation and atherosclerosis (cholesterol build-up, or plaque, in the artery walls). Lifestyle interventions such as dietary choices and physical activity address the cause of atherosclerosis. A review of the types of fats and their impact on blood cholesterol levels, along with dietary recommendations to reduce fat intake is also included.  Nutrition Action Plan  Clinical staff conducted group or  individual video education with verbal and written material and guidebook.  Patient learns how to incorporate Pritikin recommendations into their lifestyle. Recommendations include planning and keeping personal health goals in mind as an important part of their success.  Healthy Mind-Set    Healthy Minds, Bodies, Hearts  Clinical staff conducted group or individual video education with verbal and written material and guidebook.  Patient  learns how to identify when they are stressed. Video will discuss the impact of that stress, as well as the many benefits of stress management. Patient will also be introduced to stress management techniques. The way we think, act, and feel has an impact on our hearts.  How Our Thoughts Can Heal Our Hearts  Clinical staff conducted group or individual video education with verbal and written material and guidebook.  Patient learns that negative thoughts can cause depression and anxiety. This can result in negative lifestyle behavior and serious health problems. Cognitive behavioral therapy is an effective method to help control our thoughts in order to change and improve our emotional outlook.  Additional Videos:  Exercise    Improving Performance  Clinical staff conducted group or individual video education with verbal and written material and guidebook.  Patient learns to use a non-linear approach by alternating intensity levels and lengths of time spent exercising to help burn more calories and lose more body fat. Cardiovascular exercise helps improve heart health, metabolism, hormonal balance, blood sugar control, and recovery from fatigue. Resistance training improves strength, endurance, balance, coordination, reaction time, metabolism, and muscle mass. Flexibility exercise improves circulation, posture, and balance. Seek guidance from your physician and exercise physiologist before implementing an exercise routine and learn your capabilities and proper form for  all exercise.  Introduction to Yoga  Clinical staff conducted group or individual video education with verbal and written material and guidebook.  Patient learns about yoga, a discipline of the coming together of mind, breath, and body. The benefits of yoga include improved flexibility, improved range of motion, better posture and core strength, increased lung function, weight loss, and positive self-image. Yoga's heart health benefits include lowered blood pressure, healthier heart rate, decreased cholesterol and triglyceride levels, improved immune function, and reduced stress. Seek guidance from your physician and exercise physiologist before implementing an exercise routine and learn your capabilities and proper form for all exercise.  Medical   Aging: Enhancing Your Quality of Life  Clinical staff conducted group or individual video education with verbal and written material and guidebook.  Patient learns key strategies and recommendations to stay in good physical health and enhance quality of life, such as prevention strategies, having an advocate, securing a Health Care Proxy and Power of Attorney, and keeping a list of medications and system for tracking them. It also discusses how to avoid risk for bone loss.  Biology of Weight Control  Clinical staff conducted group or individual video education with verbal and written material and guidebook.  Patient learns that weight gain occurs because we consume more calories than we burn (eating more, moving less). Even if your body weight is normal, you may have higher ratios of fat compared to muscle mass. Too much body fat puts you at increased risk for cardiovascular disease, heart attack, stroke, type 2 diabetes, and obesity-related cancers. In addition to exercise, following the Pritikin Eating Plan can help reduce your risk.  Decoding Lab Results  Clinical staff conducted group or individual video education with verbal and written material and  guidebook.  Patient learns that lab test reflects one measurement whose values change over time and are influenced by many factors, including medication, stress, sleep, exercise, food, hydration, pre-existing medical conditions, and more. It is recommended to use the knowledge from this video to become more involved with your lab results and evaluate your numbers to speak with your doctor.   Diseases of Our Time - Overview  Clinical staff conducted group  or individual video education with verbal and written material and guidebook.  Patient learns that according to the CDC, 50% to 70% of chronic diseases (such as obesity, type 2 diabetes, elevated lipids, hypertension, and heart disease) are avoidable through lifestyle improvements including healthier food choices, listening to satiety cues, and increased physical activity.  Sleep Disorders Clinical staff conducted group or individual video education with verbal and written material and guidebook.  Patient learns how good quality and duration of sleep are important to overall health and well-being. Patient also learns about sleep disorders and how they impact health along with recommendations to address them, including discussing with a physician.  Nutrition  Dining Out - Part 2 Clinical staff conducted group or individual video education with verbal and written material and guidebook.  Patient learns how to plan ahead and communicate in order to maximize their dining experience in a healthy and nutritious manner. Included are recommended food choices based on the type of restaurant the patient is visiting.   Fueling a Banker conducted group or individual video education with verbal and written material and guidebook.  There is a strong connection between our food choices and our health. Diseases like obesity and type 2 diabetes are very prevalent and are in large-part due to lifestyle choices. The Pritikin Eating Plan  provides plenty of food and hunger-curbing satisfaction. It is easy to follow, affordable, and helps reduce health risks.  Menu Workshop  Clinical staff conducted group or individual video education with verbal and written material and guidebook.  Patient learns that restaurant meals can sabotage health goals because they are often packed with calories, fat, sodium, and sugar. Recommendations include strategies to plan ahead and to communicate with the manager, chef, or server to help order a healthier meal.  Planning Your Eating Strategy  Clinical staff conducted group or individual video education with verbal and written material and guidebook.  Patient learns about the Pritikin Eating Plan and its benefit of reducing the risk of disease. The Pritikin Eating Plan does not focus on calories. Instead, it emphasizes high-quality, nutrient-rich foods. By knowing the characteristics of the foods, we choose, we can determine their calorie density and make informed decisions.  Targeting Your Nutrition Priorities  Clinical staff conducted group or individual video education with verbal and written material and guidebook.  Patient learns that lifestyle habits have a tremendous impact on disease risk and progression. This video provides eating and physical activity recommendations based on your personal health goals, such as reducing LDL cholesterol, losing weight, preventing or controlling type 2 diabetes, and reducing high blood pressure.  Vitamins and Minerals  Clinical staff conducted group or individual video education with verbal and written material and guidebook.  Patient learns different ways to obtain key vitamins and minerals, including through a recommended healthy diet. It is important to discuss all supplements you take with your doctor.   Healthy Mind-Set    Smoking Cessation  Clinical staff conducted group or individual video education with verbal and written material and guidebook.   Patient learns that cigarette smoking and tobacco addiction pose a serious health risk which affects millions of people. Stopping smoking will significantly reduce the risk of heart disease, lung disease, and many forms of cancer. Recommended strategies for quitting are covered, including working with your doctor to develop a successful plan.  Culinary   Becoming a Set Designer conducted group or individual video education with verbal and written material and guidebook.  Patient learns that cooking at home can be healthy, cost-effective, quick, and puts them in control. Keys to cooking healthy recipes will include looking at your recipe, assessing your equipment needs, planning ahead, making it simple, choosing cost-effective seasonal ingredients, and limiting the use of added fats, salts, and sugars.  Cooking - Breakfast and Snacks  Clinical staff conducted group or individual video education with verbal and written material and guidebook.  Patient learns how important breakfast is to satiety and nutrition through the entire day. Recommendations include key foods to eat during breakfast to help stabilize blood sugar levels and to prevent overeating at meals later in the day. Planning ahead is also a key component.  Cooking - Educational Psychologist conducted group or individual video education with verbal and written material and guidebook.  Patient learns eating strategies to improve overall health, including an approach to cook more at home. Recommendations include thinking of animal protein as a side on your plate rather than center stage and focusing instead on lower calorie dense options like vegetables, fruits, whole grains, and plant-based proteins, such as beans. Making sauces in large quantities to freeze for later and leaving the skin on your vegetables are also recommended to maximize your experience.  Cooking - Healthy Salads and Dressing Clinical staff  conducted group or individual video education with verbal and written material and guidebook.  Patient learns that vegetables, fruits, whole grains, and legumes are the foundations of the Pritikin Eating Plan. Recommendations include how to incorporate each of these in flavorful and healthy salads, and how to create homemade salad dressings. Proper handling of ingredients is also covered. Cooking - Soups and [employer] - Soups and Desserts Clinical staff conducted group or individual video education with verbal and written material and guidebook.  Patient learns that Pritikin soups and desserts make for easy, nutritious, and delicious snacks and meal components that are low in sodium, fat, sugar, and calorie density, while high in vitamins, minerals, and filling fiber. Recommendations include simple and healthy ideas for soups and desserts.   Overview     The Pritikin Solution Program Overview Clinical staff conducted group or individual video education with verbal and written material and guidebook.  Patient learns that the results of the Pritikin Program have been documented in more than 100 articles published in peer-reviewed journals, and the benefits include reducing risk factors for (and, in some cases, even reversing) high cholesterol, high blood pressure, type 2 diabetes, obesity, and more! An overview of the three key pillars of the Pritikin Program will be covered: eating well, doing regular exercise, and having a healthy mind-set.  WORKSHOPS  Exercise: Exercise Basics: Building Your Action Plan Clinical staff led group instruction and group discussion with PowerPoint presentation and patient guidebook. To enhance the learning environment the use of posters, models and videos may be added. At the conclusion of this workshop, patients will comprehend the difference between physical activity and exercise, as well as the benefits of incorporating both, into their routine. Patients will  understand the FITT (Frequency, Intensity, Time, and Type) principle and how to use it to build an exercise action plan. In addition, safety concerns and other considerations for exercise and cardiac rehab will be addressed by the presenter. The purpose of this lesson is to promote a comprehensive and effective weekly exercise routine in order to improve patients' overall level of fitness.   Managing Heart Disease: Your Path to a Healthier Heart Clinical staff led group instruction  and group discussion with PowerPoint presentation and patient guidebook. To enhance the learning environment the use of posters, models and videos may be added.At the conclusion of this workshop, patients will understand the anatomy and physiology of the heart. Additionally, they will understand how Pritikin's three pillars impact the risk factors, the progression, and the management of heart disease.  The purpose of this lesson is to provide a high-level overview of the heart, heart disease, and how the Pritikin lifestyle positively impacts risk factors.  Exercise Biomechanics Clinical staff led group instruction and group discussion with PowerPoint presentation and patient guidebook. To enhance the learning environment the use of posters, models and videos may be added. Patients will learn how the structural parts of their bodies function and how these functions impact their daily activities, movement, and exercise. Patients will learn how to promote a neutral spine, learn how to manage pain, and identify ways to improve their physical movement in order to promote healthy living. The purpose of this lesson is to expose patients to common physical limitations that impact physical activity. Participants will learn practical ways to adapt and manage aches and pains, and to minimize their effect on regular exercise. Patients will learn how to maintain good posture while sitting, walking, and lifting.  Balance Training  and Fall Prevention  Clinical staff led group instruction and group discussion with PowerPoint presentation and patient guidebook. To enhance the learning environment the use of posters, models and videos may be added. At the conclusion of this workshop, patients will understand the importance of their sensorimotor skills (vision, proprioception, and the vestibular system) in maintaining their ability to balance as they age. Patients will apply a variety of balancing exercises that are appropriate for their current level of function. Patients will understand the common causes for poor balance, possible solutions to these problems, and ways to modify their physical environment in order to minimize their fall risk. The purpose of this lesson is to teach patients about the importance of maintaining balance as they age and ways to minimize their risk of falling.  WORKSHOPS   Nutrition:  Fueling a Ship Broker led group instruction and group discussion with PowerPoint presentation and patient guidebook. To enhance the learning environment the use of posters, models and videos may be added. Patients will review the foundational principles of the Pritikin Eating Plan and understand what constitutes a serving size in each of the food groups. Patients will also learn Pritikin-friendly foods that are better choices when away from home and review make-ahead meal and snack options. Calorie density will be reviewed and applied to three nutrition priorities: weight maintenance, weight loss, and weight gain. The purpose of this lesson is to reinforce (in a group setting) the key concepts around what patients are recommended to eat and how to apply these guidelines when away from home by planning and selecting Pritikin-friendly options. Patients will understand how calorie density may be adjusted for different weight management goals.  Mindful Eating  Clinical staff led group instruction and group  discussion with PowerPoint presentation and patient guidebook. To enhance the learning environment the use of posters, models and videos may be added. Patients will briefly review the concepts of the Pritikin Eating Plan and the importance of low-calorie dense foods. The concept of mindful eating will be introduced as well as the importance of paying attention to internal hunger signals. Triggers for non-hunger eating and techniques for dealing with triggers will be explored. The purpose of this lesson is  to provide patients with the opportunity to review the basic principles of the Pritikin Eating Plan, discuss the value of eating mindfully and how to measure internal cues of hunger and fullness using the Hunger Scale. Patients will also discuss reasons for non-hunger eating and learn strategies to use for controlling emotional eating.  Targeting Your Nutrition Priorities Clinical staff led group instruction and group discussion with PowerPoint presentation and patient guidebook. To enhance the learning environment the use of posters, models and videos may be added. Patients will learn how to determine their genetic susceptibility to disease by reviewing their family history. Patients will gain insight into the importance of diet as part of an overall healthy lifestyle in mitigating the impact of genetics and other environmental insults. The purpose of this lesson is to provide patients with the opportunity to assess their personal nutrition priorities by looking at their family history, their own health history and current risk factors. Patients will also be able to discuss ways of prioritizing and modifying the Pritikin Eating Plan for their highest risk areas  Menu  Clinical staff led group instruction and group discussion with PowerPoint presentation and patient guidebook. To enhance the learning environment the use of posters, models and videos may be added. Using menus brought in from [employer],  or printed from [employer], patients will apply the Pritikin dining out guidelines that were presented in the [employer] video. Patients will also be able to practice these guidelines in a variety of provided scenarios. The purpose of this lesson is to provide patients with the opportunity to practice hands-on learning of the Pritikin Dining Out guidelines with actual menus and practice scenarios.  Label Reading Clinical staff led group instruction and group discussion with PowerPoint presentation and patient guidebook. To enhance the learning environment the use of posters, models and videos may be added. Patients will review and discuss the Pritikin label reading guidelines presented in Pritikin's Label Reading Educational series video. Using fool labels brought in from local grocery stores and markets, patients will apply the label reading guidelines and determine if the packaged food meet the Pritikin guidelines. The purpose of this lesson is to provide patients with the opportunity to review, discuss, and practice hands-on learning of the Pritikin Label Reading guidelines with actual packaged food labels. Cooking School  Pritikin's [employer] are designed to teach patients ways to prepare quick, simple, and affordable recipes at home. The importance of nutrition's role in chronic disease risk reduction is reflected in its emphasis in the overall Pritikin program. By learning how to prepare essential core Pritikin Eating Plan recipes, patients will increase control over what they eat; be able to customize the flavor of foods without the use of added salt, sugar, or fat; and improve the quality of the food they consume. By learning a set of core recipes which are easily assembled, quickly prepared, and affordable, patients are more likely to prepare more healthy foods at home. These workshops focus on convenient breakfasts, simple entres, side dishes, and desserts which  can be prepared with minimal effort and are consistent with nutrition recommendations for cardiovascular risk reduction. Cooking [employer] are taught by a armed forces logistics/support/administrative officer (RD) who has been trained by the [employer]. The chef or RD has a clear understanding of the importance of minimizing - if not completely eliminating - added fat, sugar, and sodium in recipes. Throughout the series of Cooking School Workshop sessions, patients will learn about healthy  ingredients and efficient methods of cooking to build confidence in their capability to prepare    Cooking School weekly topics:  Adding Flavor- Sodium-Free  Fast and Healthy Breakfasts  Powerhouse Plant-Based Proteins  Satisfying Salads and Dressings  Simple Sides and Sauces  International Cuisine-Spotlight on the [employer] Zones  Delicious Desserts  Savory Soups  [employer] - Meals in a Snap  Tasty Appetizers and Snacks  Comforting Weekend Breakfasts  One-Pot Wonders   Fast Evening Meals  Landscape Architect Your [facility] Plate  WORKSHOPS   Healthy Mindset (Psychosocial):  Focused Goals, Sustainable Changes Clinical staff led group instruction and group discussion with PowerPoint presentation and patient guidebook. To enhance the learning environment the use of posters, models and videos may be added. Patients will be able to apply effective goal setting strategies to establish at least one personal goal, and then take consistent, meaningful action toward that goal. They will learn to identify common barriers to achieving personal goals and develop strategies to overcome them. Patients will also gain an understanding of how our mind-set can impact our ability to achieve goals and the importance of cultivating a positive and growth-oriented mind-set. The purpose of this lesson is to provide patients with a deeper understanding of how to set and achieve personal goals, as well as the tools  and strategies needed to overcome common obstacles which may arise along the way.  From Head to Heart: The Power of a Healthy Outlook  Clinical staff led group instruction and group discussion with PowerPoint presentation and patient guidebook. To enhance the learning environment the use of posters, models and videos may be added. Patients will be able to recognize and describe the impact of emotions and mood on physical health. They will discover the importance of self-care and explore self-care practices which may work for them. Patients will also learn how to utilize the 4 C's to cultivate a healthier outlook and better manage stress and challenges. The purpose of this lesson is to demonstrate to patients how a healthy outlook is an essential part of maintaining good health, especially as they continue their cardiac rehab journey.  Healthy Sleep for a Healthy Heart Clinical staff led group instruction and group discussion with PowerPoint presentation and patient guidebook. To enhance the learning environment the use of posters, models and videos may be added. At the conclusion of this workshop, patients will be able to demonstrate knowledge of the importance of sleep to overall health, well-being, and quality of life. They will understand the symptoms of, and treatments for, common sleep disorders. Patients will also be able to identify daytime and nighttime behaviors which impact sleep, and they will be able to apply these tools to help manage sleep-related challenges. The purpose of this lesson is to provide patients with a general overview of sleep and outline the importance of quality sleep. Patients will learn about a few of the most common sleep disorders. Patients will also be introduced to the concept of "sleep hygiene," and discover ways to self-manage certain sleeping problems through simple daily behavior changes. Finally, the workshop will motivate patients by clarifying the links between  quality sleep and their goals of heart-healthy living.   Recognizing and Reducing Stress Clinical staff led group instruction and group discussion with PowerPoint presentation and patient guidebook. To enhance the learning environment the use of posters, models and videos may be added. At the conclusion of this workshop, patients will be able to understand the types of stress reactions, differentiate between  acute and chronic stress, and recognize the impact that chronic stress has on their health. They will also be able to apply different coping mechanisms, such as reframing negative self-talk. Patients will have the opportunity to practice a variety of stress management techniques, such as deep abdominal breathing, progressive muscle relaxation, and/or guided imagery.  The purpose of this lesson is to educate patients on the role of stress in their lives and to provide healthy techniques for coping with it.  Learning Barriers/Preferences:  Learning Barriers/Preferences - 01/26/24 1332       Learning Barriers/Preferences   Learning Barriers Hearing;Exercise Concerns   right ear deaf   Learning Preferences Written Material;Skilled Demonstration;Pictoral;Verbal Instruction;Individual Instruction;Group Instruction;Audio          Education Topics:  Knowledge Questionnaire Score:  Knowledge Questionnaire Score - 01/26/24 1333       Knowledge Questionnaire Score   Pre Score 21/24          Core Components/Risk Factors/Patient Goals at Admission:  Personal Goals and Risk Factors at Admission - 01/26/24 1335       Core Components/Risk Factors/Patient Goals on Admission    Weight Management Yes;Obesity    Intervention Weight Management: Develop a combined nutrition and exercise program designed to reach desired caloric intake, while maintaining appropriate intake of nutrient and fiber, sodium and fats, and appropriate energy expenditure required for the weight goal.;Weight Management:  Provide education and appropriate resources to help participant work on and attain dietary goals.;Weight Management/Obesity: Establish reasonable short term and long term weight goals.;Obesity: Provide education and appropriate resources to help participant work on and attain dietary goals.    Admit Weight 257 lb 11.5 oz (116.9 kg)    Expected Outcomes Long Term: Adherence to nutrition and physical activity/exercise program aimed toward attainment of established weight goal;Short Term: Continue to assess and modify interventions until short term weight is achieved;Weight Loss: Understanding of general recommendations for a balanced deficit meal plan, which promotes 1-2 lb weight loss per week and includes a negative energy balance of [phone] kcal/d;Understanding recommendations for meals to include 15-35% energy as protein, 25-35% energy from fat, 35-60% energy from carbohydrates, less than 200mg  of dietary cholesterol, 20-35 gm of total fiber daily;Understanding of distribution of calorie intake throughout the day with the consumption of 4-5 meals/snacks    Diabetes Yes    Intervention Provide education about signs/symptoms and action to take for hypo/hyperglycemia.;Provide education about proper nutrition, including hydration, and aerobic/resistive exercise prescription along with prescribed medications to achieve blood glucose in normal ranges: Fasting glucose 65-99 mg/dL    Expected Outcomes Short Term: Participant verbalizes understanding of the signs/symptoms and immediate care of hyper/hypoglycemia, proper foot care and importance of medication, aerobic/resistive exercise and nutrition plan for blood glucose control.;Long Term: Attainment of HbA1C < 7%.    Heart Failure Yes    Intervention Provide a combined exercise and nutrition program that is supplemented with education, support and counseling about heart failure. Directed toward relieving symptoms such as shortness of breath, decreased exercise  tolerance, and extremity edema.    Expected Outcomes Improve functional capacity of life;Short term: Attendance in program 2-3 days a week with increased exercise capacity. Reported lower sodium intake. Reported increased fruit and vegetable intake. Reports medication compliance.;Short term: Daily weights obtained and reported for increase. Utilizing diuretic protocols set by physician.;Long term: Adoption of self-care skills and reduction of barriers for early signs and symptoms recognition and intervention leading to self-care maintenance.    Hypertension Yes  Intervention Provide education on lifestyle modifcations including regular physical activity/exercise, weight management, moderate sodium restriction and increased consumption of fresh fruit, vegetables, and low fat dairy, alcohol moderation, and smoking cessation.;Monitor prescription use compliance.    Expected Outcomes Long Term: Maintenance of blood pressure at goal levels.;Short Term: Continued assessment and intervention until BP is < 140/23mm HG in hypertensive participants. < 130/51mm HG in hypertensive participants with diabetes, heart failure or chronic kidney disease.    Lipids Yes    Intervention Provide education and support for participant on nutrition & aerobic/resistive exercise along with prescribed medications to achieve LDL 70mg , HDL >40mg .    Expected Outcomes Short Term: Participant states understanding of desired cholesterol values and is compliant with medications prescribed. Participant is following exercise prescription and nutrition guidelines.;Long Term: Cholesterol controlled with medications as prescribed, with individualized exercise RX and with personalized nutrition plan. Value goals: LDL < 70mg , HDL > 40 mg.    Stress Yes    Intervention Offer individual and/or small group education and counseling on adjustment to heart disease, stress management and health-related lifestyle change. Teach and support self-help  strategies.;Refer participants experiencing significant psychosocial distress to appropriate mental health specialists for further evaluation and treatment. When possible, include family members and significant others in education/counseling sessions.    Expected Outcomes Short Term: Participant demonstrates changes in health-related behavior, relaxation and other stress management skills, ability to obtain effective social support, and compliance with psychotropic medications if prescribed.;Long Term: Emotional wellbeing is indicated by absence of clinically significant psychosocial distress or social isolation.    Personal Goal Other Yes    Personal Goal ST: Do more ADL's LT: increase heart funtion, endurance    Intervention Will continue to monitor pt and progress workloads as tolerated    Expected Outcomes Pt will achieve his goals          Core Components/Risk Factors/Patient Goals Review:   Goals and Risk Factor Review     Row Name 02/15/24 1701             Core Components/Risk Factors/Patient Goals Review   Personal Goals Review Weight Management/Obesity;Diabetes;Heart Failure;Hypertension;Lipids;Stress       Review Sulaiman is maintaining his met levels from orientation (01/30/24) and is tolerating exercise well when in attendance       Expected Outcomes Shahin will continue to participate in cardiac rehab for exercise, nutrition and lifestyle modifications          Core Components/Risk Factors/Patient Goals at Discharge (Final Review):   Goals and Risk Factor Review - 02/15/24 1701       Core Components/Risk Factors/Patient Goals Review   Personal Goals Review Weight Management/Obesity;Diabetes;Heart Failure;Hypertension;Lipids;Stress    Review Rayan is maintaining his met levels from orientation (01/30/24) and is tolerating exercise well when in attendance    Expected Outcomes Hogan will continue to participate in cardiac rehab for exercise, nutrition and lifestyle  modifications          ITP Comments:  ITP Comments     Row Name 01/26/24 1124 01/30/24 1650 02/15/24 1651       ITP Comments Dr. [name] medical director. Introduction to [facility] education program/intensive cardiac rehab. Initial orientation packet reviewed with patient 30 Day ITP Review. Jameil started cardiac rehab today (01/30/24) and did well with exercise. 30 Day ITP Review. Jarrah is off to a good start with exercise.        Comments: see ITP comments

## 2024-02-22 ENCOUNTER — Encounter (HOSPITAL_COMMUNITY)

## 2024-02-24 ENCOUNTER — Encounter (HOSPITAL_COMMUNITY)
Admission: RE | Admit: 2024-02-24 | Discharge: 2024-02-24 | Disposition: A | Discharge status: Home / Self Care | Source: Ambulatory Visit | Attending: Cardiology | Admitting: Cardiology

## 2024-02-24 DIAGNOSIS — I5022 Chronic systolic (congestive) heart failure: Secondary | ICD-10-CM | POA: Insufficient documentation

## 2024-02-24 DIAGNOSIS — Z5189 Encounter for other specified aftercare: Secondary | ICD-10-CM | POA: Insufficient documentation

## 2024-02-27 ENCOUNTER — Encounter (HOSPITAL_COMMUNITY)
Admission: RE | Admit: 2024-02-27 | Discharge: 2024-02-27 | Disposition: A | Source: Ambulatory Visit | Attending: Cardiology

## 2024-02-27 DIAGNOSIS — I5022 Chronic systolic (congestive) heart failure: Secondary | ICD-10-CM

## 2024-02-29 ENCOUNTER — Telehealth (HOSPITAL_COMMUNITY): Payer: Self-pay

## 2024-02-29 ENCOUNTER — Encounter (HOSPITAL_COMMUNITY)
Admission: RE | Admit: 2024-02-29 | Discharge: 2024-02-29 | Disposition: A | Discharge status: Home / Self Care | Source: Ambulatory Visit | Attending: Cardiology | Admitting: Cardiology

## 2024-02-29 DIAGNOSIS — I5022 Chronic systolic (congestive) heart failure: Secondary | ICD-10-CM | POA: Diagnosis not present

## 2024-02-29 NOTE — Telephone Encounter (Signed)
 Received a document from the [facility] to send over medical records for pt for the cardiac rehab program. Passed document to Alm EP for cardiac rehab.

## 2024-03-02 ENCOUNTER — Encounter (HOSPITAL_COMMUNITY)
Admission: RE | Admit: 2024-03-02 | Discharge: 2024-03-02 | Disposition: A | Discharge status: Home / Self Care | Source: Ambulatory Visit | Attending: Cardiology | Admitting: Cardiology

## 2024-03-02 DIAGNOSIS — I5022 Chronic systolic (congestive) heart failure: Secondary | ICD-10-CM

## 2024-03-05 ENCOUNTER — Encounter (HOSPITAL_COMMUNITY)
Admission: RE | Admit: 2024-03-05 | Discharge: 2024-03-05 | Disposition: A | Discharge status: Home / Self Care | Source: Ambulatory Visit | Attending: Cardiology | Admitting: Cardiology

## 2024-03-05 DIAGNOSIS — I5022 Chronic systolic (congestive) heart failure: Secondary | ICD-10-CM | POA: Diagnosis not present

## 2024-03-07 ENCOUNTER — Encounter (HOSPITAL_COMMUNITY)
Admission: RE | Admit: 2024-03-07 | Discharge: 2024-03-07 | Disposition: A | Source: Ambulatory Visit | Attending: Cardiology

## 2024-03-07 DIAGNOSIS — I5022 Chronic systolic (congestive) heart failure: Secondary | ICD-10-CM | POA: Diagnosis not present

## 2024-03-09 ENCOUNTER — Encounter (HOSPITAL_COMMUNITY)

## 2024-03-12 ENCOUNTER — Telehealth (HOSPITAL_COMMUNITY): Payer: Self-pay

## 2024-03-12 ENCOUNTER — Encounter (HOSPITAL_COMMUNITY): Admission: RE | Admit: 2024-03-12 | Source: Ambulatory Visit

## 2024-03-12 NOTE — Telephone Encounter (Signed)
 Patient left message to c/o for 11:45 CR class, [facility]

## 2024-03-13 ENCOUNTER — Telehealth (HOSPITAL_COMMUNITY): Payer: Self-pay

## 2024-03-13 NOTE — Telephone Encounter (Signed)
 Patient's sister called to inform us  patient has been hospitalized at [facility] hospital in [place]. Cancelled 11:45 CR appt for this week.

## 2024-03-14 ENCOUNTER — Encounter (HOSPITAL_COMMUNITY)

## 2024-03-16 ENCOUNTER — Encounter (HOSPITAL_COMMUNITY)

## 2024-03-19 ENCOUNTER — Telehealth (HOSPITAL_COMMUNITY): Payer: Self-pay

## 2024-03-19 ENCOUNTER — Encounter (HOSPITAL_COMMUNITY)

## 2024-03-19 NOTE — Telephone Encounter (Signed)
 Called pt in an attempt to gain more details on when he would be able to return to cardiac rehab in light of his hospitalization at [facility] related to his foot.  Message left.

## 2024-03-19 NOTE — Telephone Encounter (Signed)
 Patient's sister called letting us  know patient is still in [facility] hospital in [place], is unsure when he will be discharged but he is supposed to get PT for his foot. Patient may not be able to return to finish program.

## 2024-03-19 NOTE — Telephone Encounter (Signed)
 Called pt's sister Rock and confirmed that pt is supposed to be d/c'ed from [facility] hospital to a skilled nursing facility with PT recommendations and wound care dressing changes.  Informed pt's sister that if he would like to return to cardiac rehab after he has recovered from all of that, to reach back out to his provider for a referral back to cardiac rehab.  Discharging pt from CR at this point in time.

## 2024-03-20 ENCOUNTER — Encounter (HOSPITAL_COMMUNITY): Payer: Self-pay

## 2024-03-20 ENCOUNTER — Telehealth (HOSPITAL_COMMUNITY): Payer: Self-pay

## 2024-03-20 DIAGNOSIS — I5022 Chronic systolic (congestive) heart failure: Secondary | ICD-10-CM

## 2024-03-20 NOTE — Telephone Encounter (Signed)
 Per JD, Patient was discharged from Cardiac Rehab Program

## 2024-03-20 NOTE — Progress Notes (Signed)
 Discharge Progress Report  Patient Details  Name: Adam Ponce MRN: [id number] Date of Birth: Sep 03, 1950 Referring Provider:   Flowsheet Row INTENSIVE CARDIAC REHAB ORIENT from 01/26/2024 in [facility]  Referring Provider Dr. Burley MD (Dr. [name] MD covering)     Number of Visits: 30  Reason for Discharge:  Early Exit:  Pt is being d/c as he is currently in the [facility] hospital having had foot surgery. Upon d/c he will be t/f to a SNF where he will be receiving wound care dressing changes and physical therapy.  He is unsure of when he will be able to return to Cardiac Rehab, does not want to keep another patient from being able to attend CR by us  holding his spot, and knows to ask his provider for another referral when he is ready to return.   Smoking History:  Social History   Tobacco Use  Smoking Status Former   Current packs/day: 1.00   Types: Cigarettes  Smokeless Tobacco Never    Diagnosis:  No diagnosis found.  ADL UCSD:   Initial Exercise Prescription:  Initial Exercise Prescription - 01/26/24 1100       Date of Initial Exercise RX and Referring Provider   Date 01/26/24    Referring Provider Dr. Burley MD (Dr. [name] MD covering)    Expected Discharge Date 04/18/24      NuStep   Level 1    SPM 55    Minutes 20    METs 1.3      Prescription Details   Frequency (times per week) 3    Duration Progress to 30 minutes of continuous aerobic without signs/symptoms of physical distress      Intensity   THRR 40-80% of Max Heartrate 59-118    Ratings of Perceived Exertion 11-13    Perceived Dyspnea 0-4      Progression   Progression Continue to progress workloads to maintain intensity without signs/symptoms of physical distress.      Resistance Training   Training Prescription Yes    Weight 2    Reps 10-15          Discharge Exercise Prescription (Final Exercise Prescription Changes):   Exercise Prescription Changes - 02/08/24 1500       Response to Exercise   Blood Pressure (Admit) 116/58    Blood Pressure (Exercise) 132/56    Blood Pressure (Exit) 104/50    Heart Rate (Admit) 63 bpm    Heart Rate (Exercise) 79 bpm    Heart Rate (Exit) 65 bpm    Rating of Perceived Exertion (Exercise) 11    Symptoms None    Comments Reviewed METs    Duration Continue with 30 min of aerobic exercise without signs/symptoms of physical distress.    Intensity THRR unchanged      Progression   Progression Continue to progress workloads to maintain intensity without signs/symptoms of physical distress.    Average METs 1.9      Resistance Training   Training Prescription No    Weight No wts on Wednesdays      Interval Training   Interval Training No      NuStep   Level 1    SPM 70    Minutes 25    METs 1.9          Functional Capacity:  6 Minute Walk     Row Name 01/26/24 1043  6 Minute Walk   Phase Initial  NUSTEP test     Distance 853 feet     Walk Time 6 minutes     # of Rest Breaks 3     MPH 1.61     METS 1.16     RPE 11     Perceived Dyspnea  0     VO2 Peak 4.07     Symptoms Yes (comment)     Comments Pt has chronic pain: bilateral knee, foot pain, right wrist pain. No pain resolves completely . x3 rest break on Nustep due to left knee pain 10/10. Total steps 295, 0.26km, AVG 6 watts     Resting HR 64 bpm     Resting BP 124/70     Resting Oxygen Saturation  97 %     Exercise Oxygen Saturation  during 6 min walk 95 %     Max Ex. HR 73 bpm     Max Ex. BP 122/78     2 Minute Post BP 124/78        Psychological, QOL, Others - Outcomes: PHQ 2/9:    02/15/2024    1:05 PM 01/26/2024   11:27 AM 03/20/2020    1:35 PM 01/10/2020   10:46 AM  Depression screen PHQ 2/9  Decreased Interest 0 1 0 2  Down, Depressed, Hopeless 0 1 0 1  PHQ - 2 Score 0 2 0 3  Altered sleeping 1 2  1   Tired, decreased energy 1 1 0 1  Change in appetite 0 1 0 2   Feeling bad or failure about yourself  0 2 0 1  Trouble concentrating 0 2 0 0  Moving slowly or fidgety/restless 0 1 0 0  Suicidal thoughts 0 0 0 0  PHQ-9 Score 2  11   8    Difficult doing work/chores Somewhat difficult Not difficult at all Not difficult at all Somewhat difficult     Data saved with a previous flowsheet row definition    Quality of Life:  Quality of Life - 01/26/24 1139       Quality of Life   Select Quality of Life      Quality of Life Scores   Health/Function Pre 19.23 %    Socioeconomic Pre 27.14 %    Psych/Spiritual Pre 28.07 %    Family Pre 23 %    GLOBAL Pre 23.24 %          Personal Goals: Goals established at orientation with interventions provided to work toward goal.  Personal Goals and Risk Factors at Admission - 01/26/24 1335       Core Components/Risk Factors/Patient Goals on Admission    Weight Management Yes;Obesity    Intervention Weight Management: Develop a combined nutrition and exercise program designed to reach desired caloric intake, while maintaining appropriate intake of nutrient and fiber, sodium and fats, and appropriate energy expenditure required for the weight goal.;Weight Management: Provide education and appropriate resources to help participant work on and attain dietary goals.;Weight Management/Obesity: Establish reasonable short term and long term weight goals.;Obesity: Provide education and appropriate resources to help participant work on and attain dietary goals.    Admit Weight 257 lb 11.5 oz (116.9 kg)    Expected Outcomes Long Term: Adherence to nutrition and physical activity/exercise program aimed toward attainment of established weight goal;Short Term: Continue to assess and modify interventions until short term weight is achieved;Weight Loss: Understanding of general recommendations for a balanced deficit meal plan, which  promotes 1-2 lb weight loss per week and includes a negative energy balance of [phone]  kcal/d;Understanding recommendations for meals to include 15-35% energy as protein, 25-35% energy from fat, 35-60% energy from carbohydrates, less than 200mg  of dietary cholesterol, 20-35 gm of total fiber daily;Understanding of distribution of calorie intake throughout the day with the consumption of 4-5 meals/snacks    Diabetes Yes    Intervention Provide education about signs/symptoms and action to take for hypo/hyperglycemia.;Provide education about proper nutrition, including hydration, and aerobic/resistive exercise prescription along with prescribed medications to achieve blood glucose in normal ranges: Fasting glucose 65-99 mg/dL    Expected Outcomes Short Term: Participant verbalizes understanding of the signs/symptoms and immediate care of hyper/hypoglycemia, proper foot care and importance of medication, aerobic/resistive exercise and nutrition plan for blood glucose control.;Long Term: Attainment of HbA1C < 7%.    Heart Failure Yes    Intervention Provide a combined exercise and nutrition program that is supplemented with education, support and counseling about heart failure. Directed toward relieving symptoms such as shortness of breath, decreased exercise tolerance, and extremity edema.    Expected Outcomes Improve functional capacity of life;Short term: Attendance in program 2-3 days a week with increased exercise capacity. Reported lower sodium intake. Reported increased fruit and vegetable intake. Reports medication compliance.;Short term: Daily weights obtained and reported for increase. Utilizing diuretic protocols set by physician.;Long term: Adoption of self-care skills and reduction of barriers for early signs and symptoms recognition and intervention leading to self-care maintenance.    Hypertension Yes    Intervention Provide education on lifestyle modifcations including regular physical activity/exercise, weight management, moderate sodium restriction and increased consumption of  fresh fruit, vegetables, and low fat dairy, alcohol moderation, and smoking cessation.;Monitor prescription use compliance.    Expected Outcomes Long Term: Maintenance of blood pressure at goal levels.;Short Term: Continued assessment and intervention until BP is < 140/31mm HG in hypertensive participants. < 130/30mm HG in hypertensive participants with diabetes, heart failure or chronic kidney disease.    Lipids Yes    Intervention Provide education and support for participant on nutrition & aerobic/resistive exercise along with prescribed medications to achieve LDL 70mg , HDL >40mg .    Expected Outcomes Short Term: Participant states understanding of desired cholesterol values and is compliant with medications prescribed. Participant is following exercise prescription and nutrition guidelines.;Long Term: Cholesterol controlled with medications as prescribed, with individualized exercise RX and with personalized nutrition plan. Value goals: LDL < 70mg , HDL > 40 mg.    Stress Yes    Intervention Offer individual and/or small group education and counseling on adjustment to heart disease, stress management and health-related lifestyle change. Teach and support self-help strategies.;Refer participants experiencing significant psychosocial distress to appropriate mental health specialists for further evaluation and treatment. When possible, include family members and significant others in education/counseling sessions.    Expected Outcomes Short Term: Participant demonstrates changes in health-related behavior, relaxation and other stress management skills, ability to obtain effective social support, and compliance with psychotropic medications if prescribed.;Long Term: Emotional wellbeing is indicated by absence of clinically significant psychosocial distress or social isolation.    Personal Goal Other Yes    Personal Goal ST: Do more ADL's LT: increase heart funtion, endurance    Intervention Will continue  to monitor pt and progress workloads as tolerated    Expected Outcomes Pt will achieve his goals           Personal Goals Discharge:  Goals and Risk Factor Review  Row Name 02/15/24 1701             Core Components/Risk Factors/Patient Goals Review   Personal Goals Review Weight Management/Obesity;Diabetes;Heart Failure;Hypertension;Lipids;Stress       Review Asa is maintaining his met levels from orientation (01/30/24) and is tolerating exercise well when in attendance       Expected Outcomes Jeremiyah will continue to participate in cardiac rehab for exercise, nutrition and lifestyle modifications          Exercise Goals and Review:  Exercise Goals     Row Name 01/26/24 1136             Exercise Goals   Increase Physical Activity Yes       Intervention Provide advice, education, support and counseling about physical activity/exercise needs.;Develop an individualized exercise prescription for aerobic and resistive training based on initial evaluation findings, risk stratification, comorbidities and participant's personal goals.       Expected Outcomes Short Term: Attend rehab on a regular basis to increase amount of physical activity.;Long Term: Add in home exercise to make exercise part of routine and to increase amount of physical activity.;Long Term: Exercising regularly at least 3-5 days a week.       Increase Strength and Stamina Yes       Intervention Provide advice, education, support and counseling about physical activity/exercise needs.;Develop an individualized exercise prescription for aerobic and resistive training based on initial evaluation findings, risk stratification, comorbidities and participant's personal goals.       Expected Outcomes Short Term: Increase workloads from initial exercise prescription for resistance, speed, and METs.;Short Term: Perform resistance training exercises routinely during rehab and add in resistance training at home;Long Term:  Improve cardiorespiratory fitness, muscular endurance and strength as measured by increased METs and functional capacity ( )       Able to understand and use rate of perceived exertion (RPE) scale Yes       Intervention Provide education and explanation on how to use RPE scale       Expected Outcomes Short Term: Able to use RPE daily in rehab to express subjective intensity level;Long Term:  Able to use RPE to guide intensity level when exercising independently       Knowledge and understanding of Target Heart Rate Range (THRR) Yes       Intervention Provide education and explanation of THRR including how the numbers were predicted and where they are located for reference       Expected Outcomes Short Term: Able to state/look up THRR;Long Term: Able to use THRR to govern intensity when exercising independently;Short Term: Able to use daily as guideline for intensity in rehab       Understanding of Exercise Prescription Yes       Intervention Provide education, explanation, and written materials on patient's individual exercise prescription       Expected Outcomes Short Term: Able to explain program exercise prescription;Long Term: Able to explain home exercise prescription to exercise independently          Exercise Goals Re-Evaluation:  Exercise Goals Re-Evaluation     Row Name 01/30/24 1622             Exercise Goal Re-Evaluation   Exercise Goals Review Increase Physical Activity;Increase Strength and Stamina;Able to understand and use rate of perceived exertion (RPE) scale;Knowledge and understanding of Target Heart Rate Range (THRR);Understanding of Exercise Prescription       Comments Pt's first day in the [facility] program. Pt understands  the exercise rx, RPE sclae and THRR.       Expected Outcomes Will continue to monitor the patient and progress exercise workloads as tolerated.          Nutrition & Weight - Outcomes:  Pre Biometrics - 01/26/24 1124       Pre Biometrics   Waist  Circumference 48 inches    Hip Circumference 50.5 inches    Waist to Hip Ratio 0.95 %    Triceps Skinfold 26 mm    % Body Fat 36.5 %    Grip Strength 20 kg    Flexibility --   Did not attempt, chronic pain   Single Leg Stand --   Did not attempt, pt uses cane          Nutrition:   Nutrition Discharge:   Education Questionnaire Score:  Knowledge Questionnaire Score - 01/26/24 1333       Knowledge Questionnaire Score   Pre Score 21/24         Goals reviewed with patient; copy given to patient.

## 2024-03-21 ENCOUNTER — Encounter (HOSPITAL_COMMUNITY)

## 2024-03-23 ENCOUNTER — Encounter (HOSPITAL_COMMUNITY)

## 2024-03-26 ENCOUNTER — Encounter (HOSPITAL_COMMUNITY)

## 2024-03-28 ENCOUNTER — Encounter (HOSPITAL_COMMUNITY)

## 2024-03-30 ENCOUNTER — Encounter (HOSPITAL_COMMUNITY)

## 2024-04-02 ENCOUNTER — Encounter (HOSPITAL_COMMUNITY)

## 2024-04-04 ENCOUNTER — Encounter (HOSPITAL_COMMUNITY)

## 2024-04-04 ENCOUNTER — Ambulatory Visit

## 2024-04-04 ENCOUNTER — Other Ambulatory Visit: Payer: Self-pay

## 2024-04-06 ENCOUNTER — Encounter (HOSPITAL_COMMUNITY)

## 2024-04-09 ENCOUNTER — Encounter (HOSPITAL_COMMUNITY)

## 2024-04-11 ENCOUNTER — Encounter (HOSPITAL_COMMUNITY)

## 2024-04-13 ENCOUNTER — Encounter (HOSPITAL_COMMUNITY)

## 2024-04-16 ENCOUNTER — Encounter (HOSPITAL_COMMUNITY)

## 2024-04-18 ENCOUNTER — Encounter (HOSPITAL_COMMUNITY)

## 2024-04-24 ENCOUNTER — Encounter: Payer: Self-pay | Admitting: Infectious Disease

## 2024-04-24 ENCOUNTER — Ambulatory Visit: Admitting: Infectious Disease

## 2024-04-24 DIAGNOSIS — E785 Hyperlipidemia, unspecified: Secondary | ICD-10-CM | POA: Insufficient documentation

## 2024-04-24 NOTE — Progress Notes (Unsigned)
 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   MRN: [id number]  HPI  Past Medical History:  Diagnosis Date   Arthritis    Asthma    Constipation    Diabetes mellitus    Heart disease    HIV disease (HCC) 04/19/1993   Hypertension    Presence of permanent cardiac pacemaker     Past Surgical History:  Procedure Laterality Date   AMPUTATION Left 04/18/2023   Procedure: LEFT SECOND AMPUTATION RAY;  Surgeon: Malvin Marsa FALCON, DPM;  Location: [facility] OR;  Service: Orthopedics/Podiatry;  Laterality: Left;   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   JOINT REPLACEMENT  2010   Bilateral hip   PACEMAKER LEADLESS INSERTION N/A 07/01/2023   Procedure: PACEMAKER LEADLESS INSERTION;  Surgeon: Nancey Eulas FORBES, MD;  Location: [facility] INVASIVE CV LAB;  Service: Cardiovascular;  Laterality: N/A;   PPM GENERATOR REMOVAL N/A 04/22/2023   Procedure: PPM GENERATOR REMOVAL;  Surgeon: Nancey Eulas FORBES, MD;  Location: [facility] INVASIVE CV LAB;  Service: Cardiovascular;  Laterality: N/A;   TRANSESOPHAGEAL ECHOCARDIOGRAM (CATH LAB) N/A 04/19/2023   Procedure: TRANSESOPHAGEAL ECHOCARDIOGRAM;  Surgeon: Mona Vinie BROCKS, MD;  Location: [facility] INVASIVE CV LAB;  Service: Cardiovascular;  Laterality: N/A;    Family History  Problem Relation Age of Onset   Heart attack Father       Social History   Socioeconomic History   Marital status: Single    Spouse name: Not on file   Number of children: Not on file   Years of education: Not on file   Highest education level: 12th grade  Occupational History   Not on file  Tobacco Use   Smoking status: Former    Current packs/day: 1.00    Types: Cigarettes   Smokeless tobacco: Never  Substance and Sexual Activity   Alcohol use: No    Comment: 2 pints a week quit 2001   Drug use: No    Sexual activity: Not on file  Other Topics Concern   Not on file  Social History Narrative   No caffeine use   Lives at home with partner    Social Drivers of Health   Tobacco Use: Medium Risk (07/01/2023)   Patient History    Smoking Tobacco Use: Former    Smokeless Tobacco Use: Never    Passive Exposure: Not on Actuary Strain: Not on file  Food Insecurity: No Food Insecurity (07/01/2023)   Hunger Vital Sign    Worried About Running Out of Food in the Last Year: Never true    Ran Out of Food in the Last Year: Never true  Recent Concern: Food Insecurity - Food Insecurity Present (04/15/2023)   Hunger Vital Sign    Worried About Running Out of Food in the Last Year: Sometimes true    Ran Out of Food in the Last Year: Never true  Transportation Needs: Unmet Transportation Needs (07/01/2023)   PRAPARE - Administrator, Civil Service (Medical): Yes    Lack of Transportation (Non-Medical): No  Physical Activity: Not on file  Stress: Not on file  Social Connections: Unknown (07/01/2023)   Social Connection and Isolation Panel    Frequency of Communication with Friends and Family: More than three times a week    Frequency  of Social Gatherings with Friends and Family: More than three times a week    Attends Religious Services: More than 4 times per year    Active Member of Clubs or Organizations: Yes    Attends Banker Meetings: More than 4 times per year    Marital Status: Patient declined  Depression (PHQ2-9): Low Risk (02/15/2024)   Depression (PHQ2-9)    PHQ-2 Score: 2  Recent Concern: Depression (PHQ2-9) - High Risk (01/26/2024)   Depression (PHQ2-9)    PHQ-2 Score: 11  Alcohol Screen: Not on file  Housing: Low Risk (07/01/2023)   Housing Stability Vital Sign    Unable to Pay for Housing in the Last Year: No    Number of Times Moved in the Last Year: 1    Homeless in the Last Year: No  Recent Concern: Housing - High Risk (04/15/2023)    Housing Stability Vital Sign    Unable to Pay for Housing in the Last Year: Yes    Number of Times Moved in the Last Year: 0    Homeless in the Last Year: No  Utilities: Not At Risk (07/01/2023)   [facility] Utilities    Threatened with loss of utilities: No  Recent Concern: Utilities - At Risk (04/15/2023)   [facility] Utilities    Threatened with loss of utilities: Yes  Health Literacy: Not on file    Allergies[1]  Current Medications[2]   Review of Systems     Objective:   Physical Exam        Assessment & Plan:       [1]  Allergies Allergen Reactions   Ciprofloxacin Other (See Comments)    unknown   Ciprofloxacin-Dexamethasone  Other (See Comments)    unknown   Codeine   [2]  Current Outpatient Medications:    acetaminophen  (TYLENOL ) 500 MG tablet, Take 1 tablet (500 mg total) by mouth 4 (four) times daily., Disp: , Rfl:    albuterol  (PROVENTIL  HFA;VENTOLIN  HFA) 108 (90 Base) MCG/ACT inhaler, Inhale 2 puffs into the lungs every 6 (six) hours as needed for wheezing or shortness of breath., Disp: , Rfl:    aluminum-magnesium hydroxide-simethicone  (MAALOX) 200-200-20 MG/5ML SUSP, Take 30 mLs by mouth 2 (two) times daily as needed (as per [place] med list)., Disp: , Rfl:    amLODipine  (NORVASC ) 5 MG tablet, Take 1 tablet (5 mg total) by mouth daily., Disp: 90 tablet, Rfl: 3   Azelastine  HCl 137 MCG/SPRAY SOLN, Place 2 sprays into the nose daily. Astepro , Disp: , Rfl:    bacitracin 500 UNIT/GM ointment, Apply 1 Application topically 2 (two) times daily. Small amount, Disp: , Rfl:    betamethasone valerate (VALISONE) 0.1 % cream, Apply topically 2 (two) times daily., Disp: , Rfl:    calcium carbonate (TUMS - DOSED IN MG ELEMENTAL CALCIUM) 500 MG chewable tablet, Chew 2 tablets by mouth 3 (three) times daily as needed for indigestion or heartburn (per [place] med list)., Disp: , Rfl:    Cholecalciferol (VITAMIN D3) 25 MCG (1000 UT) CAPS, Take 2,000 Units by mouth daily., Disp: , Rfl:     cyanocobalamin (VITAMIN B12) 1000 MCG/ML injection, Inject 1,000 mcg into the muscle every 30 (thirty) days., Disp: , Rfl:    darunavir -cobicistat  (PREZCOBIX ) 800-150 MG tablet, Take 1 tablet by mouth daily with breakfast., Disp: , Rfl:    diclofenac Sodium (VOLTAREN) 1 % GEL, Apply 2 g topically daily as needed (pain)., Disp: , Rfl:    empagliflozin (JARDIANCE) 25 MG TABS tablet,  Take 12.5 mg by mouth daily., Disp: , Rfl:    emtricitabine -tenofovir  AF (DESCOVY ) 200-25 MG tablet, Take 1 tablet by mouth daily., Disp: , Rfl:    famotidine (PEPCID) 20 MG tablet, Take 10 mg by mouth 2 (two) times daily., Disp: , Rfl:    fenofibrate (TRICOR) 145 MG tablet, Take 145 mg by mouth daily., Disp: , Rfl:    hydrocerin (EUCERIN) CREA, Apply 1 Application topically daily as needed (Dry skin)., Disp: , Rfl:    insulin  glargine (LANTUS ) 100 UNIT/ML Solostar Pen, Inject 22 Units into the skin at bedtime., Disp: , Rfl:    metFORMIN (GLUCOPHAGE) 500 MG tablet, Take 500 mg by mouth 2 (two) times daily with a meal. (Patient not taking: Reported on 01/30/2024), Disp: , Rfl:    pantoprazole  (PROTONIX ) 40 MG tablet, Take 1 tablet (40 mg total) by mouth 2 (two) times daily for 14 days., Disp: , Rfl:    pravastatin (PRAVACHOL) 80 MG tablet, Take 40 mg by mouth daily., Disp: , Rfl:    pregabalin  (LYRICA ) 100 MG capsule, Take 1 capsule (100 mg total) by mouth 2 (two) times daily. (Patient taking differently: Take 50 mg by mouth 2 (two) times daily.), Disp: 6 capsule, Rfl: 0   Semaglutide, 2 MG/DOSE, 8 MG/3ML SOPN, Inject 1 mg into the skin once a week. Inject on Thursday, Disp: , Rfl:    sennosides-docusate sodium  (SENOKOT-S) 8.6-50 MG tablet, Take 2 tablets by mouth daily., Disp: , Rfl:    sodium chloride  (OCEAN) 0.65 % nasal spray, Place 2 sprays into the nose daily as needed for congestion., Disp: , Rfl:    terazosin (HYTRIN) 2 MG capsule, Take 1 capsule by mouth at bedtime. (Patient not taking: Reported on 06/29/2023), Disp:  , Rfl:    traMADol  (ULTRAM ) 50 MG tablet, Take 50 mg by mouth every 6 (six) hours as needed for moderate pain (pain score 4-6)., Disp: , Rfl:    valACYclovir (VALTREX) 1000 MG tablet, Take 1,000 mg by mouth 2 (two) times daily as needed (per [place] med list)., Disp: , Rfl:   "

## 2024-04-26 ENCOUNTER — Ambulatory Visit: Admitting: Infectious Disease

## 2024-04-26 DIAGNOSIS — L089 Local infection of the skin and subcutaneous tissue, unspecified: Secondary | ICD-10-CM

## 2024-04-26 DIAGNOSIS — R7881 Bacteremia: Secondary | ICD-10-CM

## 2024-04-26 DIAGNOSIS — T827XXD Infection and inflammatory reaction due to other cardiac and vascular devices, implants and grafts, subsequent encounter: Secondary | ICD-10-CM

## 2024-04-26 DIAGNOSIS — B20 Human immunodeficiency virus [HIV] disease: Secondary | ICD-10-CM

## 2024-04-26 DIAGNOSIS — E1169 Type 2 diabetes mellitus with other specified complication: Secondary | ICD-10-CM

## 2024-04-26 DIAGNOSIS — E785 Hyperlipidemia, unspecified: Secondary | ICD-10-CM

## 2024-04-26 DIAGNOSIS — I509 Heart failure, unspecified: Secondary | ICD-10-CM

## 2024-04-26 DIAGNOSIS — M869 Osteomyelitis, unspecified: Secondary | ICD-10-CM

## 2024-06-01 ENCOUNTER — Ambulatory Visit: Admitting: Infectious Disease
